# Patient Record
Sex: Female | Born: 1980 | Race: White | Hispanic: No | Marital: Single | Smoking: Former smoker
Health system: Southern US, Community
[De-identification: ages and names within clinical notes are randomized; demographics above are authoritative.]

## PROBLEM LIST (undated history)

## (undated) DIAGNOSIS — F329 Major depressive disorder, single episode, unspecified: Secondary | ICD-10-CM

## (undated) DIAGNOSIS — F4310 Post-traumatic stress disorder, unspecified: Secondary | ICD-10-CM

## (undated) DIAGNOSIS — O009 Unspecified ectopic pregnancy without intrauterine pregnancy: Secondary | ICD-10-CM

## (undated) DIAGNOSIS — F419 Anxiety disorder, unspecified: Secondary | ICD-10-CM

## (undated) DIAGNOSIS — J45909 Unspecified asthma, uncomplicated: Secondary | ICD-10-CM

## (undated) DIAGNOSIS — F32A Depression, unspecified: Secondary | ICD-10-CM

## (undated) DIAGNOSIS — K589 Irritable bowel syndrome without diarrhea: Secondary | ICD-10-CM

## (undated) HISTORY — PX: TONSILLECTOMY: SUR1361

## (undated) HISTORY — DX: Major depressive disorder, single episode, unspecified: F32.9

## (undated) HISTORY — PX: TUBAL LIGATION: SHX77

## (undated) HISTORY — DX: Depression, unspecified: F32.A

---

## 1998-07-20 ENCOUNTER — Other Ambulatory Visit: Admission: RE | Admit: 1998-07-20 | Discharge: 1998-07-20 | Payer: Self-pay | Admitting: Obstetrics

## 1998-10-09 ENCOUNTER — Encounter: Admission: RE | Admit: 1998-10-09 | Discharge: 1998-10-09 | Payer: Self-pay | Admitting: Obstetrics & Gynecology

## 1999-04-06 ENCOUNTER — Other Ambulatory Visit: Admission: RE | Admit: 1999-04-06 | Discharge: 1999-04-06 | Payer: Self-pay | Admitting: Obstetrics & Gynecology

## 1999-04-06 ENCOUNTER — Encounter: Admission: RE | Admit: 1999-04-06 | Discharge: 1999-04-06 | Payer: Self-pay | Admitting: Obstetrics & Gynecology

## 1999-08-03 ENCOUNTER — Encounter: Admission: RE | Admit: 1999-08-03 | Discharge: 1999-08-03 | Payer: Self-pay | Admitting: Obstetrics & Gynecology

## 2000-03-03 ENCOUNTER — Encounter: Admission: RE | Admit: 2000-03-03 | Discharge: 2000-03-03 | Payer: Self-pay | Admitting: Internal Medicine

## 2000-03-28 ENCOUNTER — Encounter: Admission: RE | Admit: 2000-03-28 | Discharge: 2000-03-28 | Payer: Self-pay | Admitting: Obstetrics & Gynecology

## 2002-05-30 ENCOUNTER — Ambulatory Visit (HOSPITAL_COMMUNITY): Admission: RE | Admit: 2002-05-30 | Discharge: 2002-05-30 | Payer: Self-pay | Admitting: Gastroenterology

## 2003-10-24 ENCOUNTER — Emergency Department (HOSPITAL_COMMUNITY): Admission: EM | Admit: 2003-10-24 | Discharge: 2003-10-24 | Payer: Self-pay | Admitting: Emergency Medicine

## 2004-06-25 ENCOUNTER — Emergency Department (HOSPITAL_COMMUNITY): Admission: EM | Admit: 2004-06-25 | Discharge: 2004-06-25 | Payer: Self-pay | Admitting: Family Medicine

## 2004-09-02 ENCOUNTER — Emergency Department (HOSPITAL_COMMUNITY): Admission: EM | Admit: 2004-09-02 | Discharge: 2004-09-02 | Payer: Self-pay | Admitting: Family Medicine

## 2004-09-17 ENCOUNTER — Emergency Department (HOSPITAL_COMMUNITY): Admission: EM | Admit: 2004-09-17 | Discharge: 2004-09-17 | Payer: Self-pay | Admitting: Family Medicine

## 2004-12-10 ENCOUNTER — Emergency Department (HOSPITAL_COMMUNITY): Admission: EM | Admit: 2004-12-10 | Discharge: 2004-12-10 | Payer: Self-pay | Admitting: Family Medicine

## 2005-01-17 ENCOUNTER — Emergency Department (HOSPITAL_COMMUNITY): Admission: EM | Admit: 2005-01-17 | Discharge: 2005-01-17 | Payer: Self-pay | Admitting: Family Medicine

## 2005-01-29 ENCOUNTER — Emergency Department (HOSPITAL_COMMUNITY): Admission: AD | Admit: 2005-01-29 | Discharge: 2005-01-29 | Payer: Self-pay | Admitting: Family Medicine

## 2005-02-13 ENCOUNTER — Emergency Department (HOSPITAL_COMMUNITY): Admission: EM | Admit: 2005-02-13 | Discharge: 2005-02-13 | Payer: Self-pay | Admitting: Family Medicine

## 2005-03-04 ENCOUNTER — Emergency Department (HOSPITAL_COMMUNITY): Admission: EM | Admit: 2005-03-04 | Discharge: 2005-03-04 | Payer: Self-pay | Admitting: Family Medicine

## 2005-04-09 ENCOUNTER — Emergency Department (HOSPITAL_COMMUNITY): Admission: EM | Admit: 2005-04-09 | Discharge: 2005-04-09 | Payer: Self-pay | Admitting: Emergency Medicine

## 2005-04-29 ENCOUNTER — Emergency Department (HOSPITAL_COMMUNITY): Admission: AD | Admit: 2005-04-29 | Discharge: 2005-04-29 | Payer: Self-pay | Admitting: Emergency Medicine

## 2005-05-02 ENCOUNTER — Emergency Department (HOSPITAL_COMMUNITY): Admission: EM | Admit: 2005-05-02 | Discharge: 2005-05-02 | Payer: Self-pay | Admitting: *Deleted

## 2005-06-13 ENCOUNTER — Other Ambulatory Visit: Admission: RE | Admit: 2005-06-13 | Discharge: 2005-06-13 | Payer: Self-pay | Admitting: Obstetrics and Gynecology

## 2005-06-19 ENCOUNTER — Inpatient Hospital Stay (HOSPITAL_COMMUNITY): Admission: AD | Admit: 2005-06-19 | Discharge: 2005-06-19 | Payer: Self-pay | Admitting: Obstetrics and Gynecology

## 2005-07-02 ENCOUNTER — Emergency Department (HOSPITAL_COMMUNITY): Admission: EM | Admit: 2005-07-02 | Discharge: 2005-07-02 | Payer: Self-pay | Admitting: Emergency Medicine

## 2005-08-18 ENCOUNTER — Inpatient Hospital Stay (HOSPITAL_COMMUNITY): Admission: AD | Admit: 2005-08-18 | Discharge: 2005-08-18 | Payer: Self-pay | Admitting: Obstetrics and Gynecology

## 2005-09-10 ENCOUNTER — Ambulatory Visit: Payer: Self-pay | Admitting: Neonatology

## 2005-09-10 ENCOUNTER — Inpatient Hospital Stay (HOSPITAL_COMMUNITY): Admission: AD | Admit: 2005-09-10 | Discharge: 2005-09-15 | Payer: Self-pay | Admitting: Obstetrics and Gynecology

## 2005-09-13 ENCOUNTER — Encounter (INDEPENDENT_AMBULATORY_CARE_PROVIDER_SITE_OTHER): Payer: Self-pay | Admitting: Specialist

## 2005-12-06 ENCOUNTER — Emergency Department (HOSPITAL_COMMUNITY): Admission: EM | Admit: 2005-12-06 | Discharge: 2005-12-06 | Payer: Self-pay | Admitting: Family Medicine

## 2005-12-20 ENCOUNTER — Observation Stay (HOSPITAL_COMMUNITY): Admission: AD | Admit: 2005-12-20 | Discharge: 2005-12-20 | Payer: Self-pay | Admitting: Obstetrics and Gynecology

## 2005-12-20 ENCOUNTER — Encounter (INDEPENDENT_AMBULATORY_CARE_PROVIDER_SITE_OTHER): Payer: Self-pay | Admitting: *Deleted

## 2006-02-23 ENCOUNTER — Emergency Department (HOSPITAL_COMMUNITY): Admission: EM | Admit: 2006-02-23 | Discharge: 2006-02-23 | Payer: Self-pay | Admitting: Family Medicine

## 2006-04-04 ENCOUNTER — Emergency Department (HOSPITAL_COMMUNITY): Admission: EM | Admit: 2006-04-04 | Discharge: 2006-04-04 | Payer: Self-pay | Admitting: Family Medicine

## 2006-05-06 ENCOUNTER — Inpatient Hospital Stay (HOSPITAL_COMMUNITY): Admission: AD | Admit: 2006-05-06 | Discharge: 2006-05-06 | Payer: Self-pay | Admitting: Obstetrics and Gynecology

## 2006-06-16 ENCOUNTER — Other Ambulatory Visit: Admission: RE | Admit: 2006-06-16 | Discharge: 2006-06-16 | Payer: Self-pay | Admitting: Obstetrics and Gynecology

## 2006-12-11 ENCOUNTER — Emergency Department (HOSPITAL_COMMUNITY): Admission: EM | Admit: 2006-12-11 | Discharge: 2006-12-11 | Payer: Self-pay | Admitting: Family Medicine

## 2006-12-16 ENCOUNTER — Emergency Department (HOSPITAL_COMMUNITY): Admission: EM | Admit: 2006-12-16 | Discharge: 2006-12-16 | Payer: Self-pay | Admitting: Emergency Medicine

## 2007-02-04 ENCOUNTER — Emergency Department (HOSPITAL_COMMUNITY): Admission: EM | Admit: 2007-02-04 | Discharge: 2007-02-04 | Payer: Self-pay | Admitting: Emergency Medicine

## 2007-03-01 ENCOUNTER — Emergency Department (HOSPITAL_COMMUNITY): Admission: EM | Admit: 2007-03-01 | Discharge: 2007-03-01 | Payer: Self-pay | Admitting: Emergency Medicine

## 2007-04-04 ENCOUNTER — Emergency Department (HOSPITAL_COMMUNITY): Admission: EM | Admit: 2007-04-04 | Discharge: 2007-04-04 | Payer: Self-pay | Admitting: Family Medicine

## 2007-04-25 ENCOUNTER — Inpatient Hospital Stay (HOSPITAL_COMMUNITY): Admission: AD | Admit: 2007-04-25 | Discharge: 2007-04-25 | Payer: Self-pay | Admitting: Obstetrics and Gynecology

## 2007-07-05 ENCOUNTER — Ambulatory Visit (HOSPITAL_COMMUNITY): Admission: RE | Admit: 2007-07-05 | Discharge: 2007-07-05 | Payer: Self-pay | Admitting: Obstetrics and Gynecology

## 2007-08-02 ENCOUNTER — Ambulatory Visit (HOSPITAL_COMMUNITY): Admission: RE | Admit: 2007-08-02 | Discharge: 2007-08-02 | Payer: Self-pay | Admitting: Obstetrics and Gynecology

## 2007-11-04 ENCOUNTER — Inpatient Hospital Stay (HOSPITAL_COMMUNITY): Admission: AD | Admit: 2007-11-04 | Discharge: 2007-11-04 | Payer: Self-pay | Admitting: Obstetrics and Gynecology

## 2007-11-30 ENCOUNTER — Inpatient Hospital Stay (HOSPITAL_COMMUNITY): Admission: AD | Admit: 2007-11-30 | Discharge: 2007-12-01 | Payer: Self-pay | Admitting: *Deleted

## 2008-01-04 ENCOUNTER — Inpatient Hospital Stay (HOSPITAL_COMMUNITY): Admission: AD | Admit: 2008-01-04 | Discharge: 2008-01-04 | Payer: Self-pay | Admitting: Obstetrics and Gynecology

## 2008-01-09 ENCOUNTER — Inpatient Hospital Stay (HOSPITAL_COMMUNITY): Admission: AD | Admit: 2008-01-09 | Discharge: 2008-01-09 | Payer: Self-pay | Admitting: Obstetrics and Gynecology

## 2008-01-14 ENCOUNTER — Inpatient Hospital Stay (HOSPITAL_COMMUNITY): Admission: RE | Admit: 2008-01-14 | Discharge: 2008-01-16 | Payer: Self-pay | Admitting: Obstetrics and Gynecology

## 2008-01-15 ENCOUNTER — Encounter (INDEPENDENT_AMBULATORY_CARE_PROVIDER_SITE_OTHER): Payer: Self-pay | Admitting: Obstetrics and Gynecology

## 2008-02-23 ENCOUNTER — Inpatient Hospital Stay (HOSPITAL_COMMUNITY): Admission: AD | Admit: 2008-02-23 | Discharge: 2008-02-24 | Payer: Self-pay | Admitting: Obstetrics and Gynecology

## 2009-01-17 ENCOUNTER — Emergency Department (HOSPITAL_COMMUNITY): Admission: EM | Admit: 2009-01-17 | Discharge: 2009-01-17 | Payer: Self-pay | Admitting: Emergency Medicine

## 2009-07-11 ENCOUNTER — Inpatient Hospital Stay (HOSPITAL_COMMUNITY): Admission: AD | Admit: 2009-07-11 | Discharge: 2009-07-12 | Payer: Self-pay | Admitting: Obstetrics and Gynecology

## 2011-03-05 LAB — URINALYSIS, ROUTINE W REFLEX MICROSCOPIC
Glucose, UA: NEGATIVE mg/dL
Hgb urine dipstick: NEGATIVE
Ketones, ur: 15 mg/dL — AB
Leukocytes, UA: NEGATIVE
Nitrite: POSITIVE — AB
Protein, ur: 100 mg/dL — AB
Specific Gravity, Urine: >1.03 — ABNORMAL HIGH (ref 1.005–1.030)
Urobilinogen, UA: 1 mg/dL (ref 0.0–1.0)
pH: 5.5 (ref 5.0–8.0)

## 2011-03-05 LAB — URINE MICROSCOPIC-ADD ON
RBC / HPF: NONE SEEN RBC/hpf (ref <3)
WBC, UA: NONE SEEN WBC/hpf (ref <3)

## 2011-03-05 LAB — CBC
HCT: 37.9 % (ref 36.0–46.0)
Hemoglobin: 12.8 g/dL (ref 12.0–15.0)
MCHC: 33.9 g/dL (ref 30.0–36.0)
MCV: 82.4 fL (ref 78.0–100.0)
Platelets: 162 10*3/uL (ref 150–400)
RBC: 4.59 MIL/uL (ref 3.87–5.11)
RDW: 13.1 % (ref 11.5–15.5)
WBC: 10.7 10*3/uL — ABNORMAL HIGH (ref 4.0–10.5)

## 2011-03-05 LAB — WET PREP, GENITAL
Clue Cells Wet Prep HPF POC: NONE SEEN
Trich, Wet Prep: NONE SEEN
Yeast Wet Prep HPF POC: NONE SEEN

## 2011-03-05 LAB — POCT PREGNANCY, URINE: Preg Test, Ur: NEGATIVE

## 2011-03-05 LAB — GC/CHLAMYDIA PROBE AMP, GENITAL
Chlamydia, DNA Probe: NEGATIVE
GC Probe Amp, Genital: NEGATIVE

## 2011-03-15 LAB — POCT RAPID STREP A (OFFICE): Streptococcus, Group A Screen (Direct): NEGATIVE

## 2011-04-12 NOTE — Op Note (Signed)
[name], [name]                    ACCOUNT NO.:  [id number]   MEDICAL RECORD NO.:  [id number]          PATIENT TYPE:  INP   LOCATION:  9106                          FACILITY:  [facility]   PHYSICIAN:  Zenaida Niece, M.D.DATE OF BIRTH:  June 27, 1981   DATE OF PROCEDURE:  01/15/2008  DATE OF DISCHARGE:                               OPERATIVE REPORT   PREOPERATIVE DIAGNOSES:  Desires surgical sterility.   POSTOPERATIVE DIAGNOSES:  Desires surgical sterility.   OPERATION/PROCEDURE:  Right partial salpingectomy.   SURGEON:  Zenaida Niece, M.D.   ANESTHESIA:  General endotracheal tube.   ESTIMATED BLOOD LOSS:  Minimal.   FINDINGS:  The patient had normal anatomy except for a surgically absent  left fallopian tube.   SPECIMENS:  Portion of right fallopian tube sent to pathology.   COMPLICATIONS:  None.   PROCEDURE IN DETAIL:  The patient was taken to the operating room and  placed in the dorsal supine position.  General anesthesia was induced.  Abdomen was prepped and draped in the usual sterile fashion.  A 3 cm  infraumbilical incision was made and carried down to the fascia.  The  fascia was elevated and entered sharply with scissors.  Peritoneal  cavity was then entered bluntly.  The right angle retractors were used  to expose the intra-abdominal cavity and the incision was extended  bilaterally sharply.  A moist lap pad was used to pack the bowels off of  the uterus.  The right fallopian tube was identified and traced to its  fimbriated end.  A knuckle of tube was elevated with Babcock clamp.  This knuckle of tube was then ligated with 0 plain gut suture.  The  knuckle of tube was then removed sharply.  Both tubal ostia were  identified and the stumps were hemostatic.  Inspection on the left side  revealed a surgically absent left fallopian tube.  The sponge and  retractors were removed.  Fascia was closed with running 0 Vicryl.  Skin  was closed with a running subcuticular   suture of 4-0 Vicryl followed by  a bandage.  The patient tolerated the procedure well and was taken to  the recovery room in stable condition.  Counts were correct.      Zenaida Niece, M.D.  Electronically Signed     TDM/MEDQ  D:  01/15/2008  T:  01/16/2008  Job:  [id number]

## 2011-04-12 NOTE — Discharge Summary (Signed)
NAMEJANIA, [name]                    ACCOUNT NO.:  [id number]   MEDICAL RECORD NO.:  [id number]          PATIENT TYPE:  INP   LOCATION:  9106                          [facility]   PHYSICIAN:  Zenaida Niece, M.D.DATE OF BIRTH:  [birth year]   DATE OF ADMISSION:  01/14/2008  DATE OF DISCHARGE:  01/16/2008                               DISCHARGE SUMMARY   ADMISSION DIAGNOSES:  1. Intrauterine pregnancy at 39 weeks.  2. Group B Streptococcus carrier.  3. Positive anti-E antibody.  4. Desires surgical sterility.   DISCHARGE DIAGNOSES:  1. Intrauterine pregnancy at 39 weeks.  2. Group B Streptococcus carrier.  3. Positive anti-E antibody.  4. Desires surgical sterility.   PROCEDURES:  1. On February 16, she had a spontaneous vaginal delivery.  2. On February 17, she had a right partial salpingectomy.   HISTORY AND PHYSICAL:  [name] gravida 4,  para 1-1-1-1 with an EGA of 39+ weeks who is admitted for elective  induction.  Prenatal care is complicated by prior preterm delivery for  which she received 17-progesterone during her pregnancy, anxiety treated  with Zoloft which she eventually stopped.  She had an amniocentesis done  for elevated risk of trisomy 33 which revealed a normal 40 XY karyotype  and she has had a positive anti-E antibody with a very low titer.   PRENATAL LABORATORIES:  Blood type is B+ with a positive antibody screen  for anti-E.  RPR nonreactive, rubella immune, hepatitis B surface  antigen negative, HIV negative, gonorrhea and chlamydia negative, 1-hour  Glucola 76, group B strep is positive.   PAST OBSTETRIC HISTORY:  In 2002, vaginal delivery at 39 weeks, 6 pounds  10 ounces.  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 In 2006, vaginal  delivery 25 weeks, 1 pound 4 ounces PPROM and a precipitous delivery.  In 2007, she had an ectopic pregnancy.   GYNECOLOGY HISTORY:  History of cryotherapy, gonorrhea, and chlamydia.   PAST  MEDICAL HISTORY:  1. Irritable bowel syndrome.  2. anxiety.   PAST SURGICAL HISTORY:  1. Left salpingectomy for an ectopic.  2. Surgery for a nose fracture.   ALLERGIES:  POSSIBLY TO LATEX AND FLAGYL.   PHYSICAL EXAM:  VITAL SIGNS:  She is afebrile with stable vital signs.  Fetal heart tracing reactive.  ABDOMEN:  Gravid, nontender with an estimated fetal weight of 8 pounds.  GENITOURINARY:  Cervix is 3, 50, -2, vertex presentation, adequate  pelvis, amniotomy reveals clear fluid.   HOSPITAL COURSE:  The patient was admitted and had amniotomy performed  for induction and was also started on Pitocin.  She was also put on  penicillin for group B strep prophylaxis.  She eventually progressed  into labor, progressed to complete, pushed well and on the evening of  February 16th had a vaginal delivery of a viable female infant with Apgars  of 8 and 9, weight 7 pounds 14 ounces.  Placenta delivered spontaneously  and was sent for cord blood collection.  She had a small second-degree  laceration repaired with  3-0 Vicryl with local block.  She did have some  continued postpartum bleeding that responded to massage, Pitocin, and IM  Methergine with an estimated blood loss of 800 mL.  Postpartum she had  no significant complications.  Pre-delivery hemoglobin 11.5,  postdelivery 9.5.  She did wish to undergo tubal ligation.  On the  afternoon of postpartum #1, she had a right partial salpingectomy done  with evidence of prior left salpingectomy.  This was done under general  anesthesia without complications.  On postpartum day #2 her incision was  healing well and she was felt to be stable enough for discharge home.   DISCHARGE INSTRUCTIONS:  Regular diet, pelvic rest, no strenuous  activity.  Followup is in 6 weeks.   MEDICATIONS:  1. Percocet #30, 1-to-2 p.o. every 4-to-6 hours p.r.n. pain.  2. Over-the-counter ibuprofen as needed.    She is given our discharge pamphlet.      Zenaida Niece, M.D.  Electronically Signed     TDM/MEDQ  D:  01/16/2008  T:  01/16/2008  Job:  [id number]

## 2011-04-15 NOTE — Op Note (Signed)
[name], [name]                    ACCOUNT NO.:  [id number]   MEDICAL RECORD NO.:  [id number]          PATIENT TYPE:  INP   LOCATION:  9319                          [facility]:  [facility]   PHYSICIAN:  Zenaida Niece, M.D.DATE OF BIRTH:  [birth year]   DATE OF PROCEDURE:  12/20/2005  DATE OF DISCHARGE:                                 OPERATIVE REPORT   PREOPERATIVE DIAGNOSIS:  Ectopic pregnancy.   POSTOPERATIVE DIAGNOSIS:  Ruptured left ectopic pregnancy.   PROCEDURES:  Laparoscopy with left salpingectomy.   SURGEON:  Zenaida Niece, M.D.   ANESTHESIA:  General endotracheal tube.   SPECIMENS:  Left fallopian tube.   ESTIMATED BLOOD LOSS:  2000 mL of hemoperitoneum.   COMPLICATIONS:  None.   FINDINGS:  She had a 2000 mL hemoperitoneum and a ruptured fairly proximal  left ectopic pregnancy. The remainder of her pelvic anatomy appeared normal.   PROCEDURE IN DETAIL:  The patient was taken to the operating room and placed  in the dorsosupine position. General anesthesia was induced and she was  placed in mobile stirrups. Abdomen was then prepped and draped in the usual  sterile fashion, a Foley catheter was inserted and a Hulka tenaculum applied  to the cervix for uterine manipulation. Infraumbilical skin was then  infiltrated with quarter percent Marcaine and a 1.5 cm horizontal incision  was made. The Veress needle was inserted in the peritoneal cavity and  placement confirmed by the water drop test at opening pressure of 6 mmHg.  CO2 gas was insufflated to a pressure of 14 mmHg and the Veress needle was  removed. A size 11 disposable trocar was then introduced and placement  confirmed by laparoscope. She was noted to have a large amount of  hemoperitoneum. A 10 mm port was then placed on the left side under direct  visualization. A large bore suction irrigator was used to remove as much  clot and blood as possible. I was able to eventually identify the pelvic  organs. Uterus  appeared normal. Right tube and ovary appeared normal  although the tube was slightly anterior in the pelvis against the pelvic  sidewall but there did not appear to be significant adhesions. Left ovary  was normal. Left tube appeared normal except for approximately 2 cm from the  uterine cornu there was a site of rupture that was actively bleeding. Using  the tripolar device the left tube was freed from the mesosalpinx and then  freed proximally from the remainder of the tube. This removed the portion of  the tube that was bleeding with a suspected ruptured ectopic pregnancy. The  tube was removed further through the port intact. The site was inspected and  found to be hemostatic. The remainder of the clot was removed as best  possible through the suction irrigator. There was approximately 2000 mL of  blood in the pelvis. Again all the remaining anatomy appeared normal. Once  as much clot and blood was removed as possible the trocar on the left was  removed under direct visualization. All gas was allowed to deflate  from the  abdomen and the laparoscope was removed. The umbilical port was then also  removed. Skin incisions were closed with interrupted subcuticular sutures of  3-0 Vicryl  followed by Dermabond. The Hulka tenaculum and Foley catheter were removed  at the end of the case. The patient tolerated the procedure well. She  received approximately 3800 mL of crystalloid and remained hemodynamically  stable. I am going to check a hemoglobin in the recovery room to decide if  she needs a transfusion.      Zenaida Niece, M.D.  Electronically Signed     TDM/MEDQ  D:  12/20/2005  T:  12/20/2005  Job:  [id number]
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
## 2011-08-18 LAB — URINALYSIS, ROUTINE W REFLEX MICROSCOPIC
Bilirubin Urine: NEGATIVE
Glucose, UA: NEGATIVE
Hgb urine dipstick: NEGATIVE
Ketones, ur: NEGATIVE
Nitrite: NEGATIVE
Protein, ur: NEGATIVE
Specific Gravity, Urine: 1.025
Urobilinogen, UA: 0.2
pH: 5.5

## 2011-08-19 LAB — WET PREP, GENITAL
Clue Cells Wet Prep HPF POC: NONE SEEN
Trich, Wet Prep: NONE SEEN
Yeast Wet Prep HPF POC: NONE SEEN

## 2011-08-19 LAB — CBC
HCT: 27.3 — ABNORMAL LOW
HCT: 33.3 — ABNORMAL LOW
Hemoglobin: 11.5 — ABNORMAL LOW
Hemoglobin: 9.5 — ABNORMAL LOW
MCHC: 34.5
MCHC: 35
MCV: 78.9
MCV: 79.7
Platelets: 118 — ABNORMAL LOW
Platelets: 154
RBC: 3.43 — ABNORMAL LOW
RBC: 4.21
RDW: 14.4
RDW: 14.4
WBC: 10.4
WBC: 12.1 — ABNORMAL HIGH

## 2011-08-19 LAB — TYPE AND SCREEN
ABO/RH(D): B POS
Antibody Screen: POSITIVE
DAT, IgG: NEGATIVE
Donor AG Type: NEGATIVE
Donor AG Type: NEGATIVE
PT AG Type: NEGATIVE

## 2011-08-19 LAB — RPR: RPR Ser Ql: NONREACTIVE

## 2011-08-22 LAB — CBC
HCT: 29.6 — ABNORMAL LOW
Hemoglobin: 10 — ABNORMAL LOW
MCHC: 33.6
MCV: 77.2 — ABNORMAL LOW
Platelets: 156
RBC: 3.84 — ABNORMAL LOW
RDW: 14.6
WBC: 9.4

## 2011-08-22 LAB — WET PREP, GENITAL: Clue Cells Wet Prep HPF POC: NONE SEEN

## 2011-08-22 LAB — GC/CHLAMYDIA PROBE AMP, GENITAL: GC Probe Amp, Genital: NEGATIVE

## 2011-09-05 LAB — URINE MICROSCOPIC-ADD ON

## 2011-09-05 LAB — URINALYSIS, ROUTINE W REFLEX MICROSCOPIC
Leukocytes, UA: NEGATIVE
Nitrite: NEGATIVE
Protein, ur: NEGATIVE
Specific Gravity, Urine: >1.03 — ABNORMAL HIGH
Urobilinogen, UA: 0.2

## 2012-09-26 LAB — HM PAP SMEAR: HM PAP: NORMAL

## 2013-01-05 ENCOUNTER — Encounter (HOSPITAL_COMMUNITY): Payer: Self-pay

## 2013-01-05 DIAGNOSIS — R109 Unspecified abdominal pain: Secondary | ICD-10-CM | POA: Insufficient documentation

## 2013-01-05 LAB — COMPREHENSIVE METABOLIC PANEL
ALT: 46 U/L — ABNORMAL HIGH (ref 0–35)
Albumin: 3.7 g/dL (ref 3.5–5.2)
Alkaline Phosphatase: 93 U/L (ref 39–117)
BUN: 9 mg/dL (ref 6–23)
Chloride: 100 mEq/L (ref 96–112)
GFR calc Af Amer: >90 mL/min (ref >90)
Glucose, Bld: 110 mg/dL — ABNORMAL HIGH (ref 70–99)
Potassium: 3.5 mEq/L (ref 3.5–5.1)
Sodium: 138 mEq/L (ref 135–145)
Total Bilirubin: 0.4 mg/dL (ref 0.3–1.2)

## 2013-01-05 LAB — CBC WITH DIFFERENTIAL/PLATELET
Basophils Absolute: 0 10*3/uL (ref 0.0–0.1)
Basophils Relative: 0 % (ref 0–1)
Eosinophils Relative: 1 % (ref 0–5)
HCT: 40.1 % (ref 36.0–46.0)
Lymphocytes Relative: 22 % (ref 12–46)
MCHC: 33.7 g/dL (ref 30.0–36.0)
MCV: 83.5 fL (ref 78.0–100.0)
Monocytes Absolute: 0.5 10*3/uL (ref 0.1–1.0)
Platelets: 178 10*3/uL (ref 150–400)
RDW: 12.9 % (ref 11.5–15.5)
WBC: 11.5 10*3/uL — ABNORMAL HIGH (ref 4.0–10.5)

## 2013-01-05 NOTE — ED Notes (Signed)
Patient presents with c/o lower abdominal pain and bloating x 1 hour. Denies N/V/diarrhea or constipation. Admits to "dry heaving." sts that she had a "panic attack" prior to the onset of her abdominal pain. Currently pt is calm and does not appear to be anxious.

## 2013-01-06 ENCOUNTER — Emergency Department (HOSPITAL_COMMUNITY)
Admission: EM | Admit: 2013-01-06 | Discharge: 2013-01-06 | Discharge status: Against Medical Advice | Payer: Medicaid Other | Attending: Emergency Medicine | Admitting: Emergency Medicine

## 2013-01-06 HISTORY — DX: Unspecified ectopic pregnancy without intrauterine pregnancy: O00.90

## 2013-01-06 HISTORY — DX: Irritable bowel syndrome, unspecified: K58.9

## 2013-01-06 HISTORY — DX: Anxiety disorder, unspecified: F41.9

## 2013-01-06 NOTE — ED Notes (Signed)
Called for pt. To come be seen by EDP. Pt. Did not respond. We looked in lobby and outside.

## 2013-03-04 ENCOUNTER — Ambulatory Visit (INDEPENDENT_AMBULATORY_CARE_PROVIDER_SITE_OTHER): Payer: Medicaid Other | Admitting: Physician Assistant

## 2013-03-04 ENCOUNTER — Encounter: Payer: Self-pay | Admitting: Physician Assistant

## 2013-03-04 ENCOUNTER — Other Ambulatory Visit: Payer: Self-pay | Admitting: Physician Assistant

## 2013-03-04 VITALS — BP 118/68 | HR 92 | Temp 98.7°F | Resp 18 | Ht 66.5 in | Wt 286.0 lb

## 2013-03-04 DIAGNOSIS — K219 Gastro-esophageal reflux disease without esophagitis: Secondary | ICD-10-CM

## 2013-03-04 DIAGNOSIS — A499 Bacterial infection, unspecified: Secondary | ICD-10-CM

## 2013-03-04 DIAGNOSIS — B9689 Other specified bacterial agents as the cause of diseases classified elsewhere: Secondary | ICD-10-CM

## 2013-03-04 DIAGNOSIS — N760 Acute vaginitis: Secondary | ICD-10-CM

## 2013-03-04 LAB — WET PREP FOR TRICH, YEAST, CLUE: Yeast Wet Prep HPF POC: NONE SEEN

## 2013-03-04 MED ORDER — OMEPRAZOLE 20 MG PO CPDR: 20.0000 mg | DELAYED_RELEASE_CAPSULE | Freq: Every day | ORAL | Status: DC

## 2013-03-04 MED ORDER — METRONIDAZOLE 500 MG PO TABS: 500.0000 mg | ORAL_TABLET | Freq: Two times a day (BID) | ORAL | Status: DC

## 2013-03-04 NOTE — Progress Notes (Signed)
:   Patient ID: [name] MRN: [id number], DOB: 06/10/1981, 32 y.o. Date of Encounter: @DATE @  Chief Complaint:  Chief Complaint  Patient presents with  . vaginal discomfort  feels raw    HPI: 32 y.o. year old female  presents with :  1-Vaginal itching and burning. Small amount of discharge. No pelvic pain. No abnormal uterine bleeding. 2-GERD: No matter waht she eats, even if very bland food, feels pain in chest and heart burn. Has used otc antacids, tums, etc with no relief. No other abdominal pain or epigastric pain.    Past Medical History  Diagnosis Date  . Anxiety   . Irritable bowel syndrome (IBS) .  . Ectopic pregnancy      Home Meds: No current outpatient prescriptions on file prior to visit.   No current facility-administered medications on file prior to visit.    Allergies:  Allergies  Allergen Reactions  . Latex Rash    History   Social History  . Marital Status: Single    Spouse Name: N/A    Number of Children: N/A  . Years of Education: N/A   Occupational History  . Not on file.   Social History Main Topics  . Smoking status: Current Every Day Smoker -- 0.50 packs/day    Types: Cigarettes  . Smokeless tobacco: Never Used  . Alcohol Use: Yes     Comment: social  . Drug Use: Yes    Special: Marijuana  . Sexually Active: Yes    Birth Control/ Protection: None   Other Topics Concern  . Not on file   Social History Narrative  . No narrative on file    No family history on file.   Review of Systems: Constitutional: negative for chills, fever, night sweats, weight changes, or fatigue  HEENT: negative for vision changes, hearing loss, congestion, rhinorrhea, ST, epistaxis, or sinus pressure Cardiovascular: negative for chest pain or palpitations. No new/increased shortness of breath or dyspnea on exertion Respiratory: negative for hemoptysis, wheezing, shortness of breath, or cough Abdominal: negative for abdominal pain, nausea, vomiting,  diarrhea, or constipation Dermatological: negative for rash or concerning skin lesions Neurologic: negative for headache, dizziness, or syncope All other systems reviewed and are otherwise negative with the exception to those above and in the HPI.   Physical Exam: Blood pressure 118/68, pulse 92, temperature 98.7 F (37.1 C), temperature source Oral, resp. rate 18, height 5' 6.5" (1.689 m), weight 286 lb (129.729 kg), last menstrual period 02/10/2013., Body mass index is 45.48 kg/(m^2). General: Obese WF. in no acute distress. Neck: Supple. No thyromegaly. Full ROM. No lymphadenopathy. Lungs: Clear bilaterally to auscultation without wheezes, rales, or rhonchi. Breathing is unlabored. Heart: RRR with S1 S2. No murmurs, rubs, or gallops. Abdomen: Soft, non-tender, non-distended with normoactive bowel sounds. No hepatomegaly. No rebound/guarding. No obvious abdominal masses. Pelvic: External Genitalia: Normal. Medial surface of vulva has diffuse erythema. No lesions. Cervix normal. The only discharge present is liquidy clear. Bimanual exam is nml with no cervical motion tenderness. No masses.  Musculoskeletal:  Strength and tone normal for age. Extremities/Skin: Warm and dry. No clubbing or cyanosis. No edema. No rashes or suspicious lesions. Neuro: Alert and oriented X 3. Moves all extremities spontaneously. Gait is normal. CNII-XII grossly in tact. Psych:  Responds to questions appropriately with a normal affect.    ASSESSMENT AND PLAN:  32 y.o. year old female with  1. Vaginitis and vulvovaginitis - WET PREP FOR TRICH, YEAST, CLUE - GC/chlamydia probe  amp, genital   2. GERD (gastroesophageal reflux disease) - omeprazole (PRILOSEC) 20 MG capsule; Take 1 capsule (20 mg total) by mouth daily.  Dispense: 30 capsule; Refill: 3   Signed, [address] [place], [place], [facility] 03/04/2013 4:11 PM  .1. Vaginitis and vulvovaginitis  - WET PREP FOR TRICH, YEAST, CLUE - GC/chlamydia probe amp,  genital  2. GERD (gastroesophageal reflux disease) Avoid spicy foods. Avoid eating prior to lying down at night. - omeprazole (PRILOSEC) 20 MG capsule; Take 1 capsule (20 mg total) by mouth daily.  Dispense: 30 capsule; Refill: 3  3. Bacterial vaginitis  - metroNIDAZOLE (FLAGYL) 500 MG tablet; Take 1 tablet (500 mg total) by mouth 2 (two) times daily with a meal.  Dispense: 14 tablet; Refill: 0

## 2013-06-14 ENCOUNTER — Ambulatory Visit: Payer: Medicaid Other | Admitting: Family Medicine

## 2013-08-14 ENCOUNTER — Emergency Department (HOSPITAL_COMMUNITY): Payer: Medicaid Other

## 2013-08-14 ENCOUNTER — Emergency Department (HOSPITAL_COMMUNITY)
Admission: EM | Admit: 2013-08-14 | Discharge: 2013-08-14 | Disposition: A | Discharge status: Home / Self Care | Payer: Medicaid Other | Attending: Emergency Medicine | Admitting: Emergency Medicine

## 2013-08-14 ENCOUNTER — Encounter (HOSPITAL_COMMUNITY): Payer: Self-pay

## 2013-08-14 DIAGNOSIS — Z8659 Personal history of other mental and behavioral disorders: Secondary | ICD-10-CM | POA: Insufficient documentation

## 2013-08-14 DIAGNOSIS — F17200 Nicotine dependence, unspecified, uncomplicated: Secondary | ICD-10-CM | POA: Insufficient documentation

## 2013-08-14 DIAGNOSIS — J45909 Unspecified asthma, uncomplicated: Secondary | ICD-10-CM | POA: Insufficient documentation

## 2013-08-14 DIAGNOSIS — R071 Chest pain on breathing: Secondary | ICD-10-CM | POA: Insufficient documentation

## 2013-08-14 DIAGNOSIS — Z8719 Personal history of other diseases of the digestive system: Secondary | ICD-10-CM | POA: Insufficient documentation

## 2013-08-14 DIAGNOSIS — Z79899 Other long term (current) drug therapy: Secondary | ICD-10-CM | POA: Insufficient documentation

## 2013-08-14 DIAGNOSIS — R0789 Other chest pain: Secondary | ICD-10-CM

## 2013-08-14 DIAGNOSIS — Z91040 Latex allergy status: Secondary | ICD-10-CM | POA: Insufficient documentation

## 2013-08-14 HISTORY — DX: Unspecified asthma, uncomplicated: J45.909

## 2013-08-14 MED ORDER — NAPROXEN 500 MG PO TABS: 500.0000 mg | ORAL_TABLET | Freq: Two times a day (BID) | ORAL | Status: DC

## 2013-08-14 MED ORDER — NAPROXEN 250 MG PO TABS
500.0000 mg | ORAL_TABLET | Freq: Once | ORAL | Status: AC
  Administered 2013-08-14: 500 mg via ORAL
  Filled 2013-08-14: qty 2

## 2013-08-14 NOTE — ED Notes (Signed)
Pt. C/o central chest pain radiating to back starting last night. Reproducible with palpation. Hurts worse with lifting and movement of arms. Denies SOB, N/V, dizziness. Denies lifting anything heavy.

## 2013-08-14 NOTE — ED Provider Notes (Signed)
CSN: [id number]     Arrival date & time 08/14/13  0615 History   First MD Initiated Contact with Patient 08/14/13 [phone]     Chief Complaint  Patient presents with  . Chest Pain   (Consider location/radiation/quality/duration/timing/severity/associated sxs/prior Treatment) HPI  The patient is a 32 year old female with history of anxiety but no history of hypertension diabetes or hypercholesterolemia. She presents with a complaint of chest pain. She states this started last night after she had laid on the couch all day on her right arm. This pain is located in the middle of her chest, worse with moving her right arm, worse with sitting up and moving from side to side. She has never had any exertional symptoms, she has no swelling in her legs or risk factors for pulmonary embolism. This pain is mild at rest, worse with moving the right arm. She has not tried any medications prior to arrival  Past Medical History  Diagnosis Date  . Anxiety   . Irritable bowel syndrome (IBS) .  . Ectopic pregnancy   . Asthma    Past Surgical History  Procedure Laterality Date  . Tubal ligation     No family history on file. History  Substance Use Topics  . Smoking status: Current Every Day Smoker -- 0.50 packs/day    Types: Cigarettes  . Smokeless tobacco: Never Used  . Alcohol Use: Yes     Comment: social   OB History   Grav Para Term Preterm Abortions TAB SAB Ect Mult Living                 Review of Systems  All other systems reviewed and are negative.    Allergies  Latex  Home Medications   Current Outpatient Rx  Name  Route  Sig  Dispense  Refill  . omeprazole (PRILOSEC) 20 MG capsule   Oral   Take 1 capsule (20 mg total) by mouth daily.   30 capsule   3   . naproxen (NAPROSYN) 500 MG tablet   Oral   Take 1 tablet (500 mg total) by mouth 2 (two) times daily with a meal.   30 tablet   0    BP 104/64  Pulse 64  Temp(Src) 98 F (36.7 C) (Oral)  Resp 18  Ht 5\' 8"  (1.727  m)  Wt 286 lb (129.729 kg)  BMI 43.5 kg/m2  SpO2 98%  LMP 08/12/2013 Physical Exam  Nursing note and vitals reviewed. Constitutional: She appears well-developed and well-nourished. No distress.  HENT:  Head: Normocephalic and atraumatic.  Mouth/Throat: Oropharynx is clear and moist. No oropharyngeal exudate.  Eyes: Conjunctivae and EOM are normal. Pupils are equal, round, and reactive to light. Right eye exhibits no discharge. Left eye exhibits no discharge. No scleral icterus.  Neck: Normal range of motion. Neck supple. No JVD present. No thyromegaly present.  Cardiovascular: Normal rate, regular rhythm, normal heart sounds and intact distal pulses.  Exam reveals no gallop and no friction rub.   No murmur heard. Pulmonary/Chest: Effort normal and breath sounds normal. No respiratory distress. She has no wheezes. She has no rales. She exhibits tenderness ( Reproducible tenderness to the chest with palpation over the sternum as well as with movement of the right arm against resistance).  Abdominal: Soft. Bowel sounds are normal. She exhibits no distension and no mass. There is no tenderness.  Musculoskeletal: Normal range of motion. She exhibits no edema and no tenderness.  Lymphadenopathy:    She has no  cervical adenopathy.  Neurological: She is alert. Coordination normal.  Skin: Skin is warm and dry. No rash noted. No erythema.  Psychiatric: She has a normal mood and affect. Her behavior is normal.    ED Course  Procedures (including critical care time) Labs Review Labs Reviewed - No data to display Imaging Review Dg Chest 2 View  08/14/2013   CLINICAL DATA:  Chest pressure  EXAM: CHEST  2 VIEW  COMPARISON:  None.  FINDINGS: The heart size and mediastinal contours are within normal limits. Both lungs are clear. Mild dextroscoliosis of the thoracic spine.  IMPRESSION: No active cardiopulmonary disease.   Electronically Signed   By: Tiburcio Pea   On: 08/14/2013 07:18    MDM    1. Chest wall pain    Overall the patient is well-appearing, she has normal vital signs, I personally reviewed and interpreted her chest x-ray which is a 2 view PA and lateral, there is no signs of infiltrate pneumothorax or other bony abnormalities.  The patient's EKG is totally normal, there is no old EKG with which to compare. I will treat the patient with NSAIDs, I suspect that her pain is muscular related in relation to yesterday's events, there is no signs of cardiac disease and she appears stable for discharge on NSAIDs therapy.  ED ECG REPORT  I personally interpreted this EKG   Date: 08/14/2013   Rate: 69  Rhythm: normal sinus rhythm  QRS Axis: normal  Intervals: normal  ST/T Wave abnormalities: normal  Conduction Disutrbances:none  Narrative Interpretation:   Old EKG Reviewed: none available  Meds given in ED:  Medications  naproxen (NAPROSYN) tablet 500 mg (not administered)    New Prescriptions   NAPROXEN (NAPROSYN) 500 MG TABLET    Take 1 tablet (500 mg total) by mouth 2 (two) times daily with a meal.      Vida Roller, MD 08/14/13 [phone]

## 2013-08-14 NOTE — Progress Notes (Signed)
Received call from patient regarding discharge medications. States, "My instructions say I am supposed to take naprosyn but I don't have a prescription. I called my pharmacy to see if it was called in and it wasn't." [facility] spoke with Dr.Miller who will print out another copy. EDCM told patient that the prescription will be left at the front registration/nurse first desk in the ED with her name on it. No further needs identified.

## 2013-09-10 ENCOUNTER — Telehealth: Payer: Self-pay | Admitting: Family Medicine

## 2013-09-10 NOTE — Telephone Encounter (Signed)
C/O skin irritation or rash in outer peri area.  appt given for in AM

## 2013-09-11 ENCOUNTER — Encounter: Payer: Self-pay | Admitting: Physician Assistant

## 2013-09-11 ENCOUNTER — Ambulatory Visit (INDEPENDENT_AMBULATORY_CARE_PROVIDER_SITE_OTHER): Payer: Medicaid Other | Admitting: Physician Assistant

## 2013-09-11 VITALS — BP 110/64 | HR 80 | Temp 97.3°F | Resp 18 | Ht 68.75 in | Wt 293.0 lb

## 2013-09-11 DIAGNOSIS — B372 Candidiasis of skin and nail: Secondary | ICD-10-CM

## 2013-09-11 MED ORDER — NYSTATIN PO POWD: ORAL | Status: DC

## 2013-09-11 NOTE — Progress Notes (Signed)
   Patient ID: [name] MRN: [id number], DOB: [birth year], 32 y.o. Date of Encounter: 09/11/2013, 9:10 AM    Chief Complaint:  Chief Complaint  Patient presents with  . skin irritation in peri area and groin area     HPI: 32 y.o. year old obese white female just recently noticed it the last couple of days that the skin in her groin crease has  diffuse erythema.     Home Meds: See attached medication section for any medications that were entered at today's visit. The computer does not put those onto this list.The following list is a list of meds entered prior to today's visit.  None   Allergies:  Allergies  Allergen Reactions  . Latex Rash      Review of Systems: See HPI for pertinent ROS. All other ROS negative.    Physical Exam: Blood pressure 110/64, pulse 80, temperature 97.3 F (36.3 C), temperature source Oral, resp. rate 18, height 5' 8.75" (1.746 m), weight 293 lb (132.904 kg), last menstrual period 08/12/2013., Body mass index is 43.6 kg/(m^2). General: Obese white female. Appears in no acute distress. Lungs: Clear bilaterally to auscultation without wheezes, rales, or rhonchi. Breathing is unlabored. Heart: Regular rhythm. No murmurs, rubs, or gallops. Msk:  Strength and tone normal for age. Skin: Right groin crease: Approximate 6 inch area of diffuse erythema mild. No skin lesions. Neuro: Alert and oriented X 3. Moves all extremities spontaneously. Gait is normal. CNII-XII grossly in tact. Psych:  Responds to questions appropriately with a normal affect.     ASSESSMENT AND PLAN:  32 y.o. year old female with  1. Cutaneous candidiasis Discussed cause of this. After showers toweled dry completely. She may even want to use her hairdryer to further dry the skin there. If she has any sweating then she needs to also dry her skin and put on new underwear and pants. - Nystatin POWD; Apply to affected are 3 times a day  Dispense: 1 Bottle; Refill: 0   Signed, [id number] [place], [place], [facility] 09/11/2013 9:10 AM

## 2013-10-10 ENCOUNTER — Telehealth: Payer: Self-pay | Admitting: *Deleted

## 2013-10-10 MED ORDER — NYSTATIN 100000 UNIT/GM EX CREA: 1.0000 "application " | TOPICAL_CREAM | Freq: Two times a day (BID) | CUTANEOUS | Status: DC

## 2013-10-10 NOTE — Telephone Encounter (Signed)
At the visit I prescribed nystatin powder. But tried nystatin cream and see if this absorbs battering treat this better. Sen prescription for nystatin cream to apply to the area twice a day dispense # 45 g 0 refills

## 2013-10-10 NOTE — Telephone Encounter (Signed)
Med refilled, pt aware 

## 2013-10-10 NOTE — Telephone Encounter (Signed)
Pt called stating the yeast on her inner thigh has not cleared up wants to know if you can prescribe her something else.

## 2014-01-17 ENCOUNTER — Ambulatory Visit (INDEPENDENT_AMBULATORY_CARE_PROVIDER_SITE_OTHER): Payer: Medicaid Other | Admitting: Family Medicine

## 2014-01-17 ENCOUNTER — Encounter: Payer: Self-pay | Admitting: Family Medicine

## 2014-01-17 VITALS — BP 118/80 | HR 80 | Temp 98.5°F | Resp 18

## 2014-01-17 DIAGNOSIS — B372 Candidiasis of skin and nail: Secondary | ICD-10-CM

## 2014-01-17 MED ORDER — FLUCONAZOLE 150 MG PO TABS: 150.0000 mg | ORAL_TABLET | Freq: Once | ORAL | Status: DC

## 2014-01-17 MED ORDER — CLOTRIMAZOLE-BETAMETHASONE 1-0.05 % EX CREA: 1.0000 "application " | TOPICAL_CREAM | Freq: Two times a day (BID) | CUTANEOUS | Status: DC

## 2014-01-17 NOTE — Progress Notes (Signed)
   Subjective:    Patient ID: [name], female    DOB: 1981/08/09, 33 y.o.   MRN: [id number]  HPI Patient has a rash on the medial portion of both upper thighs near the perineum.  It is erythematous with white scale. It has the characteristic appearance of candida intertrigo. She has tried nystatin powder without relief as well as nystatin cream. Past Medical History  Diagnosis Date  . Anxiety   . Irritable bowel syndrome (IBS) .  . Ectopic pregnancy   . Asthma    Current Outpatient Prescriptions on File Prior to Visit  Medication Sig Dispense Refill  . nystatin cream (MYCOSTATIN) Apply 1 application topically 2 (two) times daily.  45 g  0  . Nystatin POWD Apply to affected are 3 times a day  1 Bottle  0   No current facility-administered medications on file prior to visit.   Allergies  Allergen Reactions  . Latex Rash   History   Social History  . Marital Status: Single    Spouse Name: N/A    Number of Children: N/A  . Years of Education: N/A   Occupational History  . Not on file.   Social History Main Topics  . Smoking status: Current Every Day Smoker -- 0.50 packs/day    Types: Cigarettes  . Smokeless tobacco: Never Used  . Alcohol Use: Yes     Comment: social  . Drug Use: Yes    Special: Marijuana  . Sexual Activity: Yes    Birth Control/ Protection: None   Other Topics Concern  . Not on file   Social History Narrative  . No narrative on file      Review of Systems  All other systems reviewed and are negative.       Objective:   Physical Exam  Vitals reviewed. Cardiovascular: Normal rate and regular rhythm.   Pulmonary/Chest: Effort normal and breath sounds normal.  Skin: Rash noted. There is erythema.   intertrigo-like rash on the upper thighs bilaterally        Assessment & Plan:  1. Candidal intertrigo - clotrimazole-betamethasone (LOTRISONE) cream; Apply 1 application topically 2 (two) times daily.  Dispense: 30 g; Refill: 0 -  fluconazole (DIFLUCAN) 150 MG tablet; Take 1 tablet (150 mg total) by mouth once.  Dispense: 1 tablet; Refill: 0  2. Morbid obesity - Amb ref to Medical Nutrition Therapy-MNT

## 2014-02-11 ENCOUNTER — Encounter: Payer: Medicaid Other | Admitting: Family Medicine

## 2014-02-28 ENCOUNTER — Ambulatory Visit: Payer: Medicaid Other | Admitting: Dietician

## 2014-04-01 ENCOUNTER — Ambulatory Visit (INDEPENDENT_AMBULATORY_CARE_PROVIDER_SITE_OTHER): Payer: Medicaid Other | Admitting: Family Medicine

## 2014-04-01 ENCOUNTER — Other Ambulatory Visit: Payer: Self-pay | Admitting: Family Medicine

## 2014-04-01 ENCOUNTER — Encounter: Payer: Self-pay | Admitting: Family Medicine

## 2014-04-01 VITALS — BP 128/78 | HR 88 | Temp 97.5°F | Resp 18 | Ht 68.0 in | Wt 282.0 lb

## 2014-04-01 DIAGNOSIS — F3289 Other specified depressive episodes: Secondary | ICD-10-CM

## 2014-04-01 DIAGNOSIS — F329 Major depressive disorder, single episode, unspecified: Secondary | ICD-10-CM

## 2014-04-01 DIAGNOSIS — F32A Depression, unspecified: Secondary | ICD-10-CM

## 2014-04-01 MED ORDER — DULOXETINE HCL 60 MG PO CPEP: 60.0000 mg | ORAL_CAPSULE | Freq: Every day | ORAL | Status: DC

## 2014-04-01 MED ORDER — DULOXETINE HCL 30 MG PO CPEP: 60.0000 mg | ORAL_CAPSULE | Freq: Every day | ORAL | Status: DC

## 2014-04-01 NOTE — Progress Notes (Signed)
   Subjective:    Patient ID: [name], female    DOB: [birth year], 33 y.o.   MRN: [id number]  HPI  Patient has severe depression for last several months. She reports depression, anhedonia, insomnia, daily anxiety. She's had suicidal thoughts but has stated that she has no plan and no desire to act on his thoughts. He denies any hallucinations or paranoia. She does report panic attacks. She's been self-medicating with marijuana at night. She was diagnosed with depression 15 and was started on Prozac which actually made things worse. She was then treated with Zoloft which worked well for her but lost its effectiveness after the birth of her child. She's interested today in a psychiatry referral. She has no symptoms of mania. Past Medical History  Diagnosis Date  . Anxiety   . Irritable bowel syndrome (IBS) .  . Ectopic pregnancy   . Asthma   . Depression    No current outpatient prescriptions on file prior to visit.   No current facility-administered medications on file prior to visit.   Allergies  Allergen Reactions  . Latex Rash   History   Social History  . Marital Status: Single    Spouse Name: N/A    Number of Children: N/A  . Years of Education: N/A   Occupational History  . Not on file.   Social History Main Topics  . Smoking status: Current Every Day Smoker -- 0.50 packs/day    Types: Cigarettes  . Smokeless tobacco: Never Used  . Alcohol Use: Yes     Comment: social  . Drug Use: Yes    Special: Marijuana  . Sexual Activity: Yes    Birth Control/ Protection: None   Other Topics Concern  . Not on file   Social History Narrative  . No narrative on file     Review of Systems  All other systems reviewed and are negative.      Objective:   Physical Exam  Vitals reviewed. Constitutional: She appears well-developed and well-nourished.  Cardiovascular: Normal rate, regular rhythm and normal heart sounds.   Pulmonary/Chest: Effort normal and breath sounds  normal. No respiratory distress. She has no wheezes. She has no rales.  Abdominal: Soft. Bowel sounds are normal.  Psychiatric: Her speech is normal and behavior is normal. Judgment and thought content normal. Cognition and memory are normal. She exhibits a depressed mood.          Assessment & Plan:  1. Depression Begin Cymbalta 30 mg by mouth daily for one-week and then increase to 60 mg by mouth daily. I will refer patient to psychiatry and she request. I will follow up in 4 weeks or sooner if worse. -DULoxetine (CYMBALTA) 30 MG capsule; Take 2 capsules (60 mg total) by mouth daily.  Dispense: 60 capsule; Refill: 3 - Ambulatory referral to Psychiatry

## 2014-05-09 ENCOUNTER — Telehealth: Payer: Self-pay | Admitting: Physician Assistant

## 2014-05-09 MED ORDER — FLUCONAZOLE 150 MG PO TABS: 150.0000 mg | ORAL_TABLET | Freq: Once | ORAL | Status: DC

## 2014-05-09 NOTE — Telephone Encounter (Signed)
Rx sent and pt aware 

## 2014-05-09 NOTE — Telephone Encounter (Signed)
[phone] Pharmacy is [facility]  Patient is requesting refill on her diflucan if possible she is having the same thing going on as last time she was here for rash in between her legs

## 2014-05-09 NOTE — Telephone Encounter (Signed)
ok 

## 2014-06-17 ENCOUNTER — Telehealth: Payer: Self-pay | Admitting: Physician Assistant

## 2014-06-17 NOTE — Telephone Encounter (Signed)
PATIENT WOULD LIKE TO KNOW IF WE CAN CALL HER IN RX FOR DIFLUCAN IF POSSIBLE  [facility]  CALL AND LET HER KNOW [phone]

## 2014-06-17 NOTE — Telephone Encounter (Signed)
Ok diflucan 150 po x1 

## 2014-06-18 MED ORDER — FLUCONAZOLE 150 MG PO TABS: 150.0000 mg | ORAL_TABLET | Freq: Once | ORAL | Status: DC

## 2014-06-18 NOTE — Telephone Encounter (Signed)
Rx to pharmacy and pt made aware. 

## 2014-08-21 ENCOUNTER — Telehealth: Payer: Self-pay | Admitting: Physician Assistant

## 2014-08-21 MED ORDER — NYSTATIN 100000 UNIT/GM EX CREA: 1.0000 "application " | TOPICAL_CREAM | Freq: Two times a day (BID) | CUTANEOUS | Status: DC

## 2014-08-21 MED ORDER — FLUCONAZOLE 150 MG PO TABS: 150.0000 mg | ORAL_TABLET | Freq: Once | ORAL | Status: DC

## 2014-08-21 NOTE — Telephone Encounter (Signed)
Send prescription for Diflucan 150 mg 1 by mouth x1----# 1 with 0 refills Also send prescription for nystatin cream--apply to area twice a day--- 45 g with 3 refills Tell patient there is no need for referral for this. Even a specialist is not going to treat this any differently.

## 2014-08-21 NOTE — Telephone Encounter (Signed)
Pt aware of provider recommendations.  Rx to pharmacy

## 2014-08-21 NOTE — Telephone Encounter (Signed)
[facility]  Patient is calling to get rx for her impetigo if possible  And would to know if she could get maybe a referral for this, since it has been going on for 6 months or so

## 2014-11-06 ENCOUNTER — Telehealth: Payer: Self-pay | Admitting: Physician Assistant

## 2014-11-06 NOTE — Telephone Encounter (Signed)
Patient is calling

## 2014-11-11 ENCOUNTER — Ambulatory Visit (INDEPENDENT_AMBULATORY_CARE_PROVIDER_SITE_OTHER): Payer: Medicaid Other | Admitting: Family Medicine

## 2014-11-11 ENCOUNTER — Encounter: Payer: Self-pay | Admitting: Family Medicine

## 2014-11-11 VITALS — BP 108/66 | HR 60 | Temp 98.2°F | Resp 18 | Wt 285.0 lb

## 2014-11-11 DIAGNOSIS — B372 Candidiasis of skin and nail: Secondary | ICD-10-CM

## 2014-11-11 LAB — COMPLETE METABOLIC PANEL WITH GFR
ALT: 30 U/L (ref 0–35)
AST: 23 U/L (ref 0–37)
Albumin: 3.6 g/dL (ref 3.5–5.2)
Alkaline Phosphatase: 73 U/L (ref 39–117)
BUN: 7 mg/dL (ref 6–23)
CALCIUM: 8.6 mg/dL (ref 8.4–10.5)
CHLORIDE: 108 meq/L (ref 96–112)
CO2: 26 meq/L (ref 19–32)
CREATININE: 0.55 mg/dL (ref 0.50–1.10)
GFR, Est African American: >89 mL/min
Glucose, Bld: 85 mg/dL (ref 70–99)
Potassium: 3.9 mEq/L (ref 3.5–5.3)
Sodium: 140 mEq/L (ref 135–145)
Total Bilirubin: 0.4 mg/dL (ref 0.2–1.2)
Total Protein: 5.9 g/dL — ABNORMAL LOW (ref 6.0–8.3)

## 2014-11-11 LAB — HEMOGLOBIN A1C
HEMOGLOBIN A1C: 5.1 % (ref <5.7)
Mean Plasma Glucose: 100 mg/dL (ref <117)

## 2014-11-11 LAB — CBC WITH DIFFERENTIAL/PLATELET
BASOS ABS: 0 10*3/uL (ref 0.0–0.1)
BASOS PCT: 0 % (ref 0–1)
EOS ABS: 0.2 10*3/uL (ref 0.0–0.7)
Eosinophils Relative: 2 % (ref 0–5)
HCT: 38.1 % (ref 36.0–46.0)
Hemoglobin: 12.9 g/dL (ref 12.0–15.0)
Lymphocytes Relative: 26 % (ref 12–46)
Lymphs Abs: 2.1 10*3/uL (ref 0.7–4.0)
MCH: 27.5 pg (ref 26.0–34.0)
MCHC: 33.9 g/dL (ref 30.0–36.0)
MCV: 81.2 fL (ref 78.0–100.0)
MONOS PCT: 5 % (ref 3–12)
MPV: 10.1 fL (ref 9.4–12.4)
Monocytes Absolute: 0.4 10*3/uL (ref 0.1–1.0)
Neutro Abs: 5.4 10*3/uL (ref 1.7–7.7)
Neutrophils Relative %: 67 % (ref 43–77)
PLATELETS: 192 10*3/uL (ref 150–400)
RBC: 4.69 MIL/uL (ref 3.87–5.11)
RDW: 13.3 % (ref 11.5–15.5)
WBC: 8.1 10*3/uL (ref 4.0–10.5)

## 2014-11-11 MED ORDER — FLUCONAZOLE 150 MG PO TABS: 150.0000 mg | ORAL_TABLET | Freq: Once | ORAL | Status: DC

## 2014-11-11 MED ORDER — CLOTRIMAZOLE-BETAMETHASONE 1-0.05 % EX CREA: 1.0000 "application " | TOPICAL_CREAM | Freq: Two times a day (BID) | CUTANEOUS | Status: DC

## 2014-11-11 NOTE — Progress Notes (Signed)
   Subjective:    Patient ID: [name], female    DOB: 07-26-1981, 33 y.o.   MRN: [id number]  HPI I have seen the patient once in February. At that time she had candida intertrigo. Patient states that when she takes Diflucan irritation goes away. However she is a recurrent infection almost every month in this area. Again on pelvic examination today there is erythema in the intertriginous folds between her perineum and her thighs. There is no evidence of cellulitis. There is no fissuring or lichenification. It is mildly pink erythematous skin. There is also an erythematous patch around the introitus. All this is consistent with mild irritation secondary used. The patient has morbid obesity. She also has hyperglycemia all lab work last checked in 2014. I am concerned about possible diabetes as a cause of her recurrent intertrigo. Past Medical History  Diagnosis Date  . Anxiety   . Irritable bowel syndrome (IBS) .  . Ectopic pregnancy   . Asthma   . Depression    Current Outpatient Prescriptions on File Prior to Visit  Medication Sig Dispense Refill  . DULoxetine (CYMBALTA) 60 MG capsule Take 1 capsule (60 mg total) by mouth daily. 30 capsule 3  . nystatin cream (MYCOSTATIN) Apply 1 application topically 2 (two) times daily. Apply to effected areas twice daily as needed 45 g 3  . fluconazole (DIFLUCAN) 150 MG tablet Take 1 tablet (150 mg total) by mouth once. (Patient not taking: Reported on 11/11/2014) 1 tablet 0   No current facility-administered medications on file prior to visit.   Allergies  Allergen Reactions  . Latex Rash   History   Social History  . Marital Status: Single    Spouse Name: N/A    Number of Children: N/A  . Years of Education: N/A   Occupational History  . Not on file.   Social History Main Topics  . Smoking status: Current Every Day Smoker -- 0.50 packs/day    Types: Cigarettes  . Smokeless tobacco: Never Used  . Alcohol Use: Yes     Comment: social  .  Drug Use: Yes    Special: Marijuana  . Sexual Activity: Yes    Birth Control/ Protection: None   Other Topics Concern  . Not on file   Social History Narrative      Review of Systems  All other systems reviewed and are negative.      Objective:   Physical Exam  Cardiovascular: Normal rate, regular rhythm and normal heart sounds.   Pulmonary/Chest: Effort normal and breath sounds normal.  Abdominal: Soft. Bowel sounds are normal.  Genitourinary: Vagina normal. No breast swelling or discharge. There is rash on the right labia. There is no lesion on the right labia. There is rash on the left labia. No vaginal discharge found.  Skin: Rash noted. There is erythema.  Vitals reviewed.         Assessment & Plan:  Candidal intertrigo - Plan: fluconazole (DIFLUCAN) 150 MG tablet, clotrimazole-betamethasone (LOTRISONE) cream  Morbid obesity - Plan: fluconazole (DIFLUCAN) 150 MG tablet, clotrimazole-betamethasone (LOTRISONE) cream, COMPLETE METABOLIC PANEL WITH GFR, Hemoglobin A1c  Treat with Diflucan 150 mg by mouth 1 and then to zone cream twice a day for 10 days. I recommended good personal hygiene to try to keep this area dry and the future to help prevent this. Also recommended weight loss. I will also screen the patient for type 2 diabetes mellitus.

## 2014-11-11 NOTE — Addendum Note (Signed)
Addended by: Donne AnonPLUMMER, KIM M on: 11/11/2014 10:39 AM   Modules accepted: Orders

## 2014-11-13 ENCOUNTER — Encounter: Payer: Self-pay | Admitting: *Deleted

## 2014-12-02 ENCOUNTER — Encounter (HOSPITAL_COMMUNITY): Payer: Self-pay | Admitting: Emergency Medicine

## 2014-12-02 ENCOUNTER — Emergency Department (HOSPITAL_COMMUNITY)
Admission: EM | Admit: 2014-12-02 | Discharge: 2014-12-02 | Disposition: A | Discharge status: Home / Self Care | Payer: Medicaid Other | Attending: Emergency Medicine | Admitting: Emergency Medicine

## 2014-12-02 DIAGNOSIS — H109 Unspecified conjunctivitis: Secondary | ICD-10-CM

## 2014-12-02 DIAGNOSIS — H578 Other specified disorders of eye and adnexa: Secondary | ICD-10-CM | POA: Diagnosis present

## 2014-12-02 DIAGNOSIS — F329 Major depressive disorder, single episode, unspecified: Secondary | ICD-10-CM | POA: Diagnosis not present

## 2014-12-02 DIAGNOSIS — Z91040 Latex allergy status: Secondary | ICD-10-CM | POA: Diagnosis not present

## 2014-12-02 DIAGNOSIS — Z7952 Long term (current) use of systemic steroids: Secondary | ICD-10-CM | POA: Insufficient documentation

## 2014-12-02 DIAGNOSIS — J45909 Unspecified asthma, uncomplicated: Secondary | ICD-10-CM | POA: Diagnosis not present

## 2014-12-02 DIAGNOSIS — F419 Anxiety disorder, unspecified: Secondary | ICD-10-CM | POA: Insufficient documentation

## 2014-12-02 DIAGNOSIS — Z720 Tobacco use: Secondary | ICD-10-CM | POA: Insufficient documentation

## 2014-12-02 DIAGNOSIS — Z79899 Other long term (current) drug therapy: Secondary | ICD-10-CM | POA: Insufficient documentation

## 2014-12-02 DIAGNOSIS — Z8719 Personal history of other diseases of the digestive system: Secondary | ICD-10-CM | POA: Insufficient documentation

## 2014-12-02 MED ORDER — ERYTHROMYCIN 5 MG/GM OP OINT
1.0000 "application " | TOPICAL_OINTMENT | Freq: Once | OPHTHALMIC | Status: AC
  Administered 2014-12-02: 1 via OPHTHALMIC
  Filled 2014-12-02: qty 3.5

## 2014-12-02 NOTE — Discharge Instructions (Signed)
Bacterial Conjunctivitis °Bacterial conjunctivitis, commonly called pink eye, is an inflammation of the clear membrane that covers the white part of the eye (conjunctiva). The inflammation can also happen on the underside of the eyelids. The blood vessels in the conjunctiva become inflamed, causing the eye to become red or pink. Bacterial conjunctivitis may spread easily from one eye to another and from person to person (contagious).  °CAUSES  °Bacterial conjunctivitis is caused by bacteria. The bacteria may come from your own skin, your upper respiratory tract, or from someone else with bacterial conjunctivitis. °SYMPTOMS  °The normally white color of the eye or the underside of the eyelid is usually pink or red. The pink eye is usually associated with irritation, tearing, and some sensitivity to light. Bacterial conjunctivitis is often associated with a thick, yellowish discharge from the eye. The discharge may turn into a crust on the eyelids overnight, which causes your eyelids to stick together. If a discharge is present, there may also be some blurred vision in the affected eye. °DIAGNOSIS  °Bacterial conjunctivitis is diagnosed by your caregiver through an eye exam and the symptoms that you report. Your caregiver looks for changes in the surface tissues of your eyes, which may point to the specific type of conjunctivitis. A sample of any discharge may be collected on a cotton-tip swab if you have a severe case of conjunctivitis, if your cornea is affected, or if you keep getting repeat infections that do not respond to treatment. The sample will be sent to a lab to see if the inflammation is caused by a bacterial infection and to see if the infection will respond to antibiotic medicines. °TREATMENT  °· Bacterial conjunctivitis is treated with antibiotics. Antibiotic eyedrops are most often used. However, antibiotic ointments are also available. Antibiotics pills are sometimes used. Artificial tears or eye  washes may ease discomfort. °HOME CARE INSTRUCTIONS  °· To ease discomfort, apply a cool, clean washcloth to your eye for 10-20 minutes, 3-4 times a day. °· Gently wipe away any drainage from your eye with a warm, wet washcloth or a cotton ball. °· Wash your hands often with soap and water. Use paper towels to dry your hands. °· Do not share towels or washcloths. This may spread the infection. °· Change or wash your pillowcase every day. °· You should not use eye makeup until the infection is gone. °· Do not operate machinery or drive if your vision is blurred. °· Stop using contact lenses. Ask your caregiver how to sterilize or replace your contacts before using them again. This depends on the type of contact lenses that you use. °· When applying medicine to the infected eye, do not touch the edge of your eyelid with the eyedrop bottle or ointment tube. °SEEK IMMEDIATE MEDICAL CARE IF:  °· Your infection has not improved within 3 days after beginning treatment. °· You had yellow discharge from your eye and it returns. °· You have increased eye pain. °· Your eye redness is spreading. °· Your vision becomes blurred. °· You have a fever or persistent symptoms for more than 2-3 days. °· You have a fever and your symptoms suddenly get worse. °· You have facial pain, redness, or swelling. °MAKE SURE YOU:  °· Understand these instructions. °· Will watch your condition. °· Will get help right away if you are not doing well or get worse. °Document Released: 11/14/2005 Document Revised: 03/31/2014 Document Reviewed: 04/16/2012 °[employer]® Patient Information ©2015 [employer]. This information is not intended to   replace advice given to you by your health care provider. Make sure you discuss any questions you have with your health care provider. Use the supplied ointment as follows one quarter in stripe of the alignment inside the lower lid 4 times a day until the eye is clear of redness and exudate for 24 hours

## 2014-12-02 NOTE — ED Provider Notes (Signed)
CSN: [id number]     Arrival date & time 12/02/14  1954 History   First MD Initiated Contact with Patient 12/02/14 2031     Chief Complaint  Patient presents with  . Conjunctivitis     (Consider location/radiation/quality/duration/timing/severity/associated sxs/prior Treatment) HPI Comments: Patient started with tearing and slight erythema to the lower eyelid 2 days ago that has extended to include the upper lid as well as a purulent discharge from the left eye only denies any URI symptoms.  Denies any trauma, visual changes. Does not wear contact lenses.  Has no known contacts with conjunctivitis.  Patient is a 34 y.o. female presenting with conjunctivitis. The history is provided by the patient.  Conjunctivitis This is a new problem. The current episode started yesterday. The problem occurs constantly. The problem has been unchanged. Pertinent negatives include no chills, congestion, coughing, fever, headaches, rash or visual change. Nothing aggravates the symptoms. She has tried nothing for the symptoms. The treatment provided no relief.    Past Medical History  Diagnosis Date  . Anxiety   . Irritable bowel syndrome (IBS) .  . Ectopic pregnancy   . Asthma   . Depression    Past Surgical History  Procedure Laterality Date  . Tubal ligation     No family history on file. History  Substance Use Topics  . Smoking status: Current Every Day Smoker -- 0.50 packs/day    Types: Cigarettes  . Smokeless tobacco: Never Used  . Alcohol Use: Yes     Comment: social   OB History    No data available     Review of Systems  Constitutional: Negative for fever and chills.  HENT: Negative for congestion and rhinorrhea.   Eyes: Positive for discharge, redness and itching. Negative for photophobia, pain and visual disturbance.  Respiratory: Negative for cough.   Skin: Negative for rash.  Neurological: Negative for headaches.  All other systems reviewed and are negative.     Allergies   Latex  Home Medications   Prior to Admission medications   Medication Sig Start Date End Date Taking? Authorizing Provider  clotrimazole-betamethasone (LOTRISONE) cream Apply 1 application topically 2 (two) times daily. 11/11/14   Donita BrooksWarren T Pickard, MD  DULoxetine (CYMBALTA) 60 MG capsule Take 1 capsule (60 mg total) by mouth daily. 04/01/14   Donita BrooksWarren T Pickard, MD  fluconazole (DIFLUCAN) 150 MG tablet Take 1 tablet (150 mg total) by mouth once. Patient not taking: Reported on 11/11/2014 08/21/14   Dorena BodoMary B Dixon, PA-C  fluconazole (DIFLUCAN) 150 MG tablet Take 1 tablet (150 mg total) by mouth once. 11/11/14   Donita BrooksWarren T Pickard, MD  nystatin cream (MYCOSTATIN) Apply 1 application topically 2 (two) times daily. Apply to effected areas twice daily as needed 08/21/14   Dorena BodoMary B Dixon, PA-C   BP 170/75 mmHg  Pulse 78  Temp(Src) 98 F (36.7 C) (Oral)  Resp 18  Ht 5\' 8"  (1.727 m)  Wt 284 lb (128.822 kg)  BMI 43.19 kg/m2  SpO2 96%  LMP 11/11/2014 Physical Exam  Constitutional: She appears well-developed and well-nourished.  HENT:  Head: Normocephalic and atraumatic.  Right Ear: External ear normal.  Left Ear: External ear normal.  Mouth/Throat: Oropharynx is clear and moist.  Eyes: EOM are normal. Pupils are equal, round, and reactive to light. Left eye exhibits discharge. Left conjunctiva is injected. Left eye exhibits no nystagmus.    No pain with eye movement  Nursing note and vitals reviewed.   ED Course  Procedures (  including critical care time) Labs Review Labs Reviewed - No data to display  Imaging Review No results found.   EKG Interpretation None      MDM   Final diagnoses:  Conjunctivitis of left eye         Arman Filter, NP 12/02/14 2042  Benny Lennert, MD 12/05/14 1325

## 2014-12-02 NOTE — ED Notes (Signed)
Pt. reports left eye redness with drainage/matted onset 2 days ago , denies injury / no blurred vision .

## 2014-12-03 ENCOUNTER — Other Ambulatory Visit: Payer: Medicaid Other

## 2014-12-03 DIAGNOSIS — Z0189 Encounter for other specified special examinations: Secondary | ICD-10-CM

## 2014-12-04 ENCOUNTER — Ambulatory Visit (INDEPENDENT_AMBULATORY_CARE_PROVIDER_SITE_OTHER): Payer: Medicaid Other | Admitting: Family Medicine

## 2014-12-04 ENCOUNTER — Encounter: Payer: Self-pay | Admitting: Family Medicine

## 2014-12-04 VITALS — BP 110/68 | HR 80 | Temp 98.3°F | Resp 18 | Ht 68.0 in | Wt 277.0 lb

## 2014-12-04 DIAGNOSIS — Z0000 Encounter for general adult medical examination without abnormal findings: Secondary | ICD-10-CM

## 2014-12-04 DIAGNOSIS — H109 Unspecified conjunctivitis: Secondary | ICD-10-CM

## 2014-12-04 DIAGNOSIS — R350 Frequency of micturition: Secondary | ICD-10-CM

## 2014-12-04 LAB — URINALYSIS, ROUTINE W REFLEX MICROSCOPIC
Glucose, UA: NEGATIVE mg/dL
HGB URINE DIPSTICK: NEGATIVE
KETONES UR: NEGATIVE mg/dL
Leukocytes, UA: NEGATIVE
NITRITE: NEGATIVE
PH: 5.5 (ref 5.0–8.0)
Protein, ur: NEGATIVE mg/dL
Specific Gravity, Urine: 1.02 (ref 1.005–1.030)
UROBILINOGEN UA: 0.2 mg/dL (ref 0.0–1.0)

## 2014-12-04 LAB — MICROALBUMIN / CREATININE URINE RATIO
Creatinine, Urine: 246.9 mg/dL
MICROALB/CREAT RATIO: 6.9 mg/g (ref 0.0–30.0)
Microalb, Ur: 1.7 mg/dL (ref <2.0)

## 2014-12-04 MED ORDER — TOBRAMYCIN-DEXAMETHASONE 0.3-0.1 % OP OINT: 1.0000 "application " | TOPICAL_OINTMENT | Freq: Three times a day (TID) | OPHTHALMIC | Status: DC

## 2014-12-04 MED ORDER — SULFAMETHOXAZOLE-TRIMETHOPRIM 400-80 MG PO TABS: 1.0000 | ORAL_TABLET | Freq: Two times a day (BID) | ORAL | Status: DC

## 2014-12-04 NOTE — Progress Notes (Signed)
 Subjective:    Patient ID: [name], female    DOB: 08/09/1981, 34 y.o.   MRN: [id number]  HPI Patient has had panic attacks recently and increasing stress and anxiety. She has lost several pounds due to lack of appetite. Coincidentally she discontinued her Cymbalta abruptly at Christmas time. She just recently started back on the medication. On the medication she was doing well. Unfortunately she has had conjunctivitis in her left eye she went to emergency room and was given erythromycin ointment which has not helped. Also seen exam today there are no dendrites. However the erythema is pronounced and is now spreading to the skin surrounding the eye. She has swelling in the upper and lower eyelids as well as the left cheek. Office Visit on 12/04/2014  Component Date Value Ref Range Status  . Color, Urine 12/04/2014 YELLOW  YELLOW Final  . APPearance 12/04/2014 CLEAR  CLEAR Final  . Specific Gravity, Urine 12/04/2014 1.020  1.005 - 1.030 Final  . pH 12/04/2014 5.5  5.0 - 8.0 Final  . Glucose, UA 12/04/2014 NEG  NEG mg/dL Final  . Bilirubin Urine 12/04/2014 SMALL* NEG Final  . Ketones, ur 12/04/2014 NEG  NEG mg/dL Final  . Hgb urine dipstick 12/04/2014 NEG  NEG Final  . Protein, ur 12/04/2014 NEG  NEG mg/dL Final  . Urobilinogen, UA 12/04/2014 0.2  0.0 - 1.0 mg/dL Final  . Nitrite 12/04/2014 NEG  NEG Final  . Leukocytes, UA 12/04/2014 NEG  NEG Final  Lab on 12/03/2014  Component Date Value Ref Range Status  . Microalb, Ur 12/03/2014 1.7  <2.0 mg/dL Final   Comment: ** Please note change in reference range(s). ** The ADA (Diabetes Care 2014;37(suppl 1):S14-S80) has defined abnormalities in albumin excretion as follows:            Category           Result                            (mg/g creatinine)                 Normal:    <30       Microalbuminuria:    30 - 299   Clinical albuminuria:    > or = 300    The ADA recommends that at least two of three specimens collected within a 3  - 6 month period be abnormal before considering a patient to be within a diagnostic category.   . Creatinine, Urine 12/03/2014 246.9   Final   No reference range established.  . Microalb Creat Ratio 12/03/2014 6.9  0.0 - 30.0 mg/g Final  Office Visit on 11/11/2014  Component Date Value Ref Range Status  . Sodium 11/11/2014 140  135 - 145 mEq/L Final  . Potassium 11/11/2014 3.9  3.5 - 5.3 mEq/L Final  . Chloride 11/11/2014 108  96 - 112 mEq/L Final  . CO2 11/11/2014 26  19 - 32 mEq/L Final  . Glucose, Bld 11/11/2014 85  70 - 99 mg/dL Final  . BUN 11/11/2014 7  6 - 23 mg/dL Final  . Creat 11/11/2014 0.55  0.50 - 1.10 mg/dL Final  . Total Bilirubin 11/11/2014 0.4  0.2 - 1.2 mg/dL Final  . Alkaline Phosphatase 11/11/2014 73  39 - 117 U/L Final  . AST 11/11/2014 23  0 - 37 U/L Final  . ALT 11/11/2014 30  0 - 35 U/L Final  .   Total Protein 11/11/2014 5.9* 6.0 - 8.3 g/dL Final  . Albumin 11/11/2014 3.6  3.5 - 5.2 g/dL Final  . Calcium 11/11/2014 8.6  8.4 - 10.5 mg/dL Final  . GFR, Est African American 11/11/2014 >89   Final  . GFR, Est Non African American 11/11/2014 >89   Final   Comment:   The estimated GFR is a calculation valid for adults (>=18 years old) that uses the CKD-EPI algorithm to adjust for age and sex. It is   not to be used for children, pregnant women, hospitalized patients,    patients on dialysis, or with rapidly changing kidney function. According to the NKDEP, eGFR >89 is normal, 60-89 shows mild impairment, 30-59 shows moderate impairment, 15-29 shows severe impairment and <15 is ESRD.     . Hgb A1c MFr Bld 11/11/2014 5.1  <5.7 % Final   Comment:                                                                        According to the ADA Clinical Practice Recommendations for 2011, when HbA1c is used as a screening test:     >=6.5%   Diagnostic of Diabetes Mellitus            (if abnormal result is confirmed)   5.7-6.4%   Increased risk of developing Diabetes  [place]   References:Diagnosis and Classification of Diabetes Mellitus,Diabetes Care,2011,34(Suppl 1):S62-S69 and Standards of Medical Care in         Diabetes - 2011,Diabetes Care,2011,34 (Suppl 1):S11-S61.     . Mean Plasma Glucose 11/11/2014 100  <117 mg/dL Final  . WBC 11/11/2014 8.1  4.0 - 10.5 K/uL Final  . RBC 11/11/2014 4.69  3.87 - 5.11 MIL/uL Final  . Hemoglobin 11/11/2014 12.9  12.0 - 15.0 g/dL Final  . HCT 11/11/2014 38.1  36.0 - 46.0 % Final  . MCV 11/11/2014 81.2  78.0 - 100.0 fL Final  . MCH 11/11/2014 27.5  26.0 - 34.0 pg Final  . MCHC 11/11/2014 33.9  30.0 - 36.0 g/dL Final  . RDW 11/11/2014 13.3  11.5 - 15.5 % Final  . Platelets 11/11/2014 192  150 - 400 K/uL Final  . MPV 11/11/2014 10.1  9.4 - 12.4 fL Final  . Neutrophils Relative % 11/11/2014 67  43 - 77 % Final  . Neutro Abs 11/11/2014 5.4  1.7 - 7.7 K/uL Final  . Lymphocytes Relative 11/11/2014 26  12 - 46 % Final  . Lymphs Abs 11/11/2014 2.1  0.7 - 4.0 K/uL Final  . Monocytes Relative 11/11/2014 5  3 - 12 % Final  . Monocytes Absolute 11/11/2014 0.4  0.1 - 1.0 K/uL Final  . Eosinophils Relative 11/11/2014 2  0 - 5 % Final  . Eosinophils Absolute 11/11/2014 0.2  0.0 - 0.7 K/uL Final  . Basophils Relative 11/11/2014 0  0 - 1 % Final  . Basophils Absolute 11/11/2014 0.0  0.0 - 0.1 K/uL Final  . Smear Review 11/11/2014 Criteria for review not met   Final   Past Medical History  Diagnosis Date  . Anxiety   . Irritable bowel syndrome (IBS) .  . Ectopic pregnancy   . Asthma   . Depression    Past   Surgical History  Procedure Laterality Date  . Tubal ligation     Current Outpatient Prescriptions on File Prior to Visit  Medication Sig Dispense Refill  . clotrimazole-betamethasone (LOTRISONE) cream Apply 1 application topically 2 (two) times daily. 30 g 0  . DULoxetine (CYMBALTA) 60 MG capsule Take 1 capsule (60 mg total) by mouth daily. 30 capsule 3   No current facility-administered medications on file  prior to visit.   Allergies  Allergen Reactions  . Latex Rash   History   Social History  . Marital Status: Single    Spouse Name: N/A    Number of Children: N/A  . Years of Education: N/A   Occupational History  . Not on file.   Social History Main Topics  . Smoking status: Current Every Day Smoker -- 0.50 packs/day    Types: Cigarettes  . Smokeless tobacco: Never Used  . Alcohol Use: Yes     Comment: social  . Drug Use: Yes    Special: Marijuana  . Sexual Activity: Yes    Birth Control/ Protection: None   Other Topics Concern  . Not on file   Social History Narrative   No family history on file.    Review of Systems  All other systems reviewed and are negative.      Objective:   Physical Exam  Constitutional: She is oriented to person, place, and time. She appears well-developed and well-nourished. No distress.  HENT:  Head: Normocephalic and atraumatic.  Right Ear: External ear normal.  Left Ear: External ear normal.  Nose: Nose normal.  Mouth/Throat: Oropharynx is clear and moist. No oropharyngeal exudate.  Eyes: EOM are normal. Pupils are equal, round, and reactive to light. Left eye exhibits discharge and exudate. Left conjunctiva is injected.  Neck: Normal range of motion. Neck supple. No JVD present. No thyromegaly present.  Cardiovascular: Normal rate, regular rhythm and normal heart sounds.  Exam reveals no gallop and no friction rub.   No murmur heard. Pulmonary/Chest: Effort normal and breath sounds normal. No respiratory distress. She has no wheezes. She has no rales. She exhibits no tenderness.  Abdominal: Soft. Bowel sounds are normal. She exhibits no distension and no mass. There is no tenderness. There is no rebound and no guarding.  Genitourinary: Vagina normal and uterus normal.  Musculoskeletal: Normal range of motion. She exhibits no edema or tenderness.  Lymphadenopathy:    She has no cervical adenopathy.  Neurological: She is alert  and oriented to person, place, and time. She has normal reflexes. She displays normal reflexes. No cranial nerve deficit. She exhibits normal muscle tone. Coordination normal.  Skin: Skin is warm. No rash noted. She is not diaphoretic. No erythema. No pallor.  Psychiatric: She has a normal mood and affect. Her behavior is normal. Judgment and thought content normal.  Vitals reviewed.         Assessment & Plan:  Frequent urination - Plan: Urinalysis, Routine w reflex microscopic  Conjunctivitis of left eye - Plan: sulfamethoxazole-trimethoprim (BACTRIM) 400-80 MG per tablet, tobramycin-dexamethasone (TOBRADEX) ophthalmic ointment  Routine general medical examination at a health care facility - Plan: PAP, Thin Prep w/HPV rflx HPV Type 16/18  Patient's physical exam is normal. I will treat her conjunctivitis with a combination of TobraDex drops 3 times a day as well as Bactrim to cover the preseptal cellulitis. Recheck next week if no better or sooner if worse. Pap smear was sent to pathology. The remainder of her blood work is normal. I   recommended diet exercise and weight loss. Also recommended she resume her Cymbalta to be consistent in taking it because a believe her anxiety is due to withdrawal.

## 2014-12-08 LAB — PAP, THIN PREP W/HPV RFLX HPV TYPE 16/18: HPV DNA High Risk: NOT DETECTED

## 2014-12-09 ENCOUNTER — Encounter: Payer: Self-pay | Admitting: Family Medicine

## 2014-12-16 ENCOUNTER — Emergency Department (HOSPITAL_COMMUNITY)
Admission: EM | Admit: 2014-12-16 | Discharge: 2014-12-16 | Disposition: A | Discharge status: Home / Self Care | Payer: Medicaid Other | Attending: Emergency Medicine | Admitting: Emergency Medicine

## 2014-12-16 ENCOUNTER — Encounter (HOSPITAL_COMMUNITY): Payer: Self-pay | Admitting: Physical Medicine and Rehabilitation

## 2014-12-16 DIAGNOSIS — F419 Anxiety disorder, unspecified: Secondary | ICD-10-CM | POA: Insufficient documentation

## 2014-12-16 DIAGNOSIS — Z91040 Latex allergy status: Secondary | ICD-10-CM | POA: Diagnosis not present

## 2014-12-16 DIAGNOSIS — J45909 Unspecified asthma, uncomplicated: Secondary | ICD-10-CM | POA: Insufficient documentation

## 2014-12-16 DIAGNOSIS — K0889 Other specified disorders of teeth and supporting structures: Secondary | ICD-10-CM

## 2014-12-16 DIAGNOSIS — K088 Other specified disorders of teeth and supporting structures: Secondary | ICD-10-CM | POA: Diagnosis present

## 2014-12-16 DIAGNOSIS — Z720 Tobacco use: Secondary | ICD-10-CM | POA: Insufficient documentation

## 2014-12-16 DIAGNOSIS — F329 Major depressive disorder, single episode, unspecified: Secondary | ICD-10-CM | POA: Diagnosis not present

## 2014-12-16 DIAGNOSIS — Z792 Long term (current) use of antibiotics: Secondary | ICD-10-CM | POA: Diagnosis not present

## 2014-12-16 DIAGNOSIS — Z79899 Other long term (current) drug therapy: Secondary | ICD-10-CM | POA: Diagnosis not present

## 2014-12-16 MED ORDER — PENICILLIN V POTASSIUM 500 MG PO TABS: 500.0000 mg | ORAL_TABLET | Freq: Four times a day (QID) | ORAL | Status: DC

## 2014-12-16 MED ORDER — HYDROCODONE-ACETAMINOPHEN 5-325 MG PO TABS
2.0000 | ORAL_TABLET | Freq: Once | ORAL | Status: DC
  Filled 2014-12-16: qty 2

## 2014-12-16 MED ORDER — PENICILLIN V POTASSIUM 250 MG PO TABS
500.0000 mg | ORAL_TABLET | Freq: Once | ORAL | Status: AC
  Administered 2014-12-16: 500 mg via ORAL
  Filled 2014-12-16: qty 2

## 2014-12-16 MED ORDER — HYDROCODONE-ACETAMINOPHEN 5-325 MG PO TABS: 2.0000 | ORAL_TABLET | ORAL | Status: DC | PRN

## 2014-12-16 NOTE — ED Provider Notes (Signed)
CSN: [id number]     Arrival date & time 12/16/14  4098 History  This chart was scribed for non-physician practitioner, Emilia Beck, PA-C, working with Toy Baker, MD by Charline Bills, ED Scribe. This patient was seen in room TR10C/TR10C and the patient's care was started at 9:20 AM.   Chief Complaint  Patient presents with  . Dental Pain   The history is provided by the patient. No language interpreter was used.   HPI Comments: [name] is a 34 y.o. female, with a h/o anxiety, depression, IBS, asthma, who presents to the Emergency Department complaining of ongoing, intermittent L lower dental pain for the past year, constant for the past 4 days. She denies fever and gum swelling. Pt has been treating with ibuprofen and mouth wash without relief. No dentist.   Past Medical History  Diagnosis Date  . Anxiety   . Irritable bowel syndrome (IBS) .  . Ectopic pregnancy   . Asthma   . Depression    Past Surgical History  Procedure Laterality Date  . Tubal ligation     No family history on file. History  Substance Use Topics  . Smoking status: Current Every Day Smoker -- 0.50 packs/day    Types: Cigarettes  . Smokeless tobacco: Never Used  . Alcohol Use: Yes     Comment: social   OB History    No data available     Review of Systems  Constitutional: Negative for fever.  HENT: Positive for dental problem.   All other systems reviewed and are negative.  Allergies  Latex  Home Medications   Prior to Admission medications   Medication Sig Start Date End Date Taking? Authorizing Provider  clotrimazole-betamethasone (LOTRISONE) cream Apply 1 application topically 2 (two) times daily. 11/11/14   Donita Brooks, MD  DULoxetine (CYMBALTA) 60 MG capsule Take 1 capsule (60 mg total) by mouth daily. 04/01/14   Donita Brooks, MD  erythromycin ophthalmic ointment Place 1 application into the left eye 4 (four) times daily.    Historical Provider, MD   sulfamethoxazole-trimethoprim (BACTRIM) 400-80 MG per tablet Take 1 tablet by mouth 2 (two) times daily. 12/04/14   Donita Brooks, MD  tobramycin-dexamethasone [facility]) ophthalmic ointment Place 1 application into the left eye 3 (three) times daily. 12/04/14   Donita Brooks, MD   Triage Vitals: BP 124/72 mmHg  Pulse 84  Temp(Src) 97.9 F (36.6 C) (Oral)  Resp 18  Ht  (1.727 m)  Wt 175 lb (79.379 kg)  BMI 26.61 kg/m2  SpO2 99%  LMP 11/11/2014 Physical Exam  Constitutional: She is oriented to person, place, and time. She appears well-developed and well-nourished. No distress.  HENT:  Head: Normocephalic and atraumatic.  Mouth/Throat: No trismus in the jaw.  Fair dentition. Tenderness to percussion of L lower premolar. No abscess noted.   Eyes: Conjunctivae and EOM are normal.  Neck: Neck supple.  Cardiovascular: Normal rate.   Pulmonary/Chest: Effort normal.  Abdominal: Soft. She exhibits no distension. There is no tenderness.  Musculoskeletal: Normal range of motion.  Lymphadenopathy:    She has no cervical adenopathy.  Neurological: She is alert and oriented to person, place, and time.  Skin: Skin is warm and dry.  Psychiatric: She has a normal mood and affect. Her behavior is normal.  Nursing note and vitals reviewed.  ED Course  Procedures (including critical care time) DIAGNOSTIC STUDIES: Oxygen Saturation is 99% on RA, normal by my interpretation.  COORDINATION OF CARE: 9:23 AM-Discussed treatment plan which includes follow-up with dentist with pt at bedside and pt agreed to plan.   Labs Review Labs Reviewed - No data to display  Imaging Review No results found.   EKG Interpretation None      MDM   Final diagnoses:  Pain, dental    No signs of ludwigs angina. Patient will have antibiotics, pain medication, and dental referral.   I personally performed the services described in this documentation, which was scribed in my presence. The  recorded information has been reviewed and is accurate.    Emilia BeckKaitlyn Wilmer Berryhill, PA-C 12/18/14 2047  Toy BakerAnthony T Allen, MD 12/21/14 [phone]

## 2014-12-16 NOTE — ED Notes (Signed)
Pt presents to department for L lower dental pain. Ongoing intermittently for several years. 10/10 pain upon arrival to ED. NAD.

## 2014-12-16 NOTE — ED Notes (Signed)
PT STATES SHE IS DRIVING. VICODIN NOT GIVEN.

## 2014-12-16 NOTE — Discharge Instructions (Signed)
Take Veetid as directed until gone. Take Vicodin as needed for pain. Follow up with the recommended dentist. Return to the ED with worsening or concerning symptoms.

## 2015-03-06 ENCOUNTER — Ambulatory Visit (INDEPENDENT_AMBULATORY_CARE_PROVIDER_SITE_OTHER): Payer: Medicaid Other | Admitting: Family Medicine

## 2015-03-06 ENCOUNTER — Encounter: Payer: Self-pay | Admitting: Family Medicine

## 2015-03-06 VITALS — BP 138/72 | HR 98 | Temp 98.5°F | Resp 16 | Ht 68.0 in | Wt 287.0 lb

## 2015-03-06 DIAGNOSIS — F419 Anxiety disorder, unspecified: Secondary | ICD-10-CM | POA: Diagnosis not present

## 2015-03-06 DIAGNOSIS — F329 Major depressive disorder, single episode, unspecified: Secondary | ICD-10-CM

## 2015-03-06 DIAGNOSIS — F32A Depression, unspecified: Secondary | ICD-10-CM

## 2015-03-06 MED ORDER — ALPRAZOLAM 0.5 MG PO TABS: 0.5000 mg | ORAL_TABLET | Freq: Every evening | ORAL | Status: DC | PRN

## 2015-03-06 NOTE — Progress Notes (Signed)
Subjective:    Patient ID: [name], female    DOB: 08-Mar-1981, 34 y.o.   MRN: [id number]  HPI 1/16 Patient has had panic attacks recently and increasing stress and anxiety. She has lost several pounds due to lack of appetite. Coincidentally she discontinued her Cymbalta abruptly at Christmas time. She just recently started back on the medication. On the medication she was doing well. Unfortunately she has had conjunctivitis in her left eye she went to emergency room and was given erythromycin ointment which has not helped. Also seen exam today there are no dendrites. However the erythema is pronounced and is now spreading to the skin surrounding the eye. She has swelling in the upper and lower eyelids as well as the left cheek.  At that time, my plan was: Patient's physical exam is normal. I will treat her conjunctivitis with a combination of TobraDex drops 3 times a day as well as Bactrim to cover the preseptal cellulitis. Recheck next week if no better or sooner if worse. Pap smear was sent to pathology. The remainder of her blood work is normal. I recommended diet exercise and weight loss. Also recommended she resume her Cymbalta to be consistent in taking it because a believe her anxiety is due to withdrawal.   03/06/15 Patient states she feels better on the Cymbalta. However by 5:00 in the afternoon she starts experiencing withdrawal from the medication. She begins to feel anxious and frustrated. The anxiety keeps her from sleeping. However the patient also admits that she has been smoking marijuana every night for the last several months. It seems she smokes marijuana her symptoms go away. Her temper improves. The mood swings improve. Past Medical History  Diagnosis Date  . Anxiety   . Irritable bowel syndrome (IBS) .  . Ectopic pregnancy   . Asthma   . Depression    Past Surgical History  Procedure Laterality Date  . Tubal ligation     Current Outpatient Prescriptions on File Prior  to Visit  Medication Sig Dispense Refill  . DULoxetine (CYMBALTA) 60 MG capsule Take 1 capsule (60 mg total) by mouth daily. 30 capsule 3   No current facility-administered medications on file prior to visit.   Allergies  Allergen Reactions  . Latex Rash   History   Social History  . Marital Status: Single    Spouse Name: N/A  . Number of Children: N/A  . Years of Education: N/A   Occupational History  . Not on file.   Social History Main Topics  . Smoking status: Current Every Day Smoker -- 0.50 packs/day    Types: Cigarettes  . Smokeless tobacco: Never Used  . Alcohol Use: Yes     Comment: social  . Drug Use: Yes    Special: Marijuana  . Sexual Activity: Yes    Birth Control/ Protection: None   Other Topics Concern  . Not on file   Social History Narrative   No family history on file.    Review of Systems  All other systems reviewed and are negative.      Objective:   Physical Exam  Constitutional: She is oriented to person, place, and time. She appears well-developed and well-nourished. No distress.  HENT:  Head: Normocephalic and atraumatic.  Right Ear: External ear normal.  Left Ear: External ear normal.  Nose: Nose normal.  Mouth/Throat: Oropharynx is clear and moist. No oropharyngeal exudate.  Eyes: EOM are normal. Pupils are equal, round, and reactive to light.  Neck: Normal range of motion. Neck supple. No JVD present. No thyromegaly present.  Cardiovascular: Normal rate, regular rhythm and normal heart sounds.  Exam reveals no gallop and no friction rub.   No murmur heard. Pulmonary/Chest: Effort normal and breath sounds normal. No respiratory distress. She has no wheezes. She has no rales. She exhibits no tenderness.  Abdominal: Soft. Bowel sounds are normal. She exhibits no distension and no mass. There is no tenderness. There is no rebound and no guarding.  Musculoskeletal: Normal range of motion. She exhibits no edema or tenderness.    Lymphadenopathy:    She has no cervical adenopathy.  Neurological: She is alert and oriented to person, place, and time. She has normal reflexes. No cranial nerve deficit. She exhibits normal muscle tone. Coordination normal.  Skin: Skin is warm. No rash noted. She is not diaphoretic. No erythema. No pallor.  Psychiatric: She has a normal mood and affect. Her behavior is normal. Judgment and thought content normal.  Vitals reviewed.         Assessment & Plan:  Anxiety - Plan: ALPRAZolam (XANAX) 0.5 MG tablet  Depression  Patient's depression has improved on Cymbalta. However I don't believe she is withdrawing from the medication later in the afternoon. Instead I believe she is withdrawing from her marijuana. I explained this to the patient. I would like her to discontinue smoking marijuana. She can use Xanax 0.5 mg by mouth daily at bedtime to help with anxiety and insomnia as she withdraws from the marijuana. After the first 2 weeks. I would like her to try to transition off the Xanax and discontinue the medication altogether.

## 2015-05-05 ENCOUNTER — Encounter: Payer: Self-pay | Admitting: Family Medicine

## 2015-11-14 ENCOUNTER — Emergency Department (HOSPITAL_COMMUNITY)
Admission: EM | Admit: 2015-11-14 | Discharge: 2015-11-15 | Disposition: A | Discharge status: Other Acute Inpatient Hospital | Payer: Medicaid Other | Attending: Emergency Medicine | Admitting: Emergency Medicine

## 2015-11-14 ENCOUNTER — Emergency Department (HOSPITAL_COMMUNITY): Payer: Medicaid Other

## 2015-11-14 ENCOUNTER — Encounter (HOSPITAL_COMMUNITY): Payer: Self-pay | Admitting: Emergency Medicine

## 2015-11-14 DIAGNOSIS — F17210 Nicotine dependence, cigarettes, uncomplicated: Secondary | ICD-10-CM | POA: Diagnosis not present

## 2015-11-14 DIAGNOSIS — Z79899 Other long term (current) drug therapy: Secondary | ICD-10-CM | POA: Insufficient documentation

## 2015-11-14 DIAGNOSIS — Z91040 Latex allergy status: Secondary | ICD-10-CM | POA: Diagnosis not present

## 2015-11-14 DIAGNOSIS — F419 Anxiety disorder, unspecified: Secondary | ICD-10-CM | POA: Diagnosis not present

## 2015-11-14 DIAGNOSIS — F1210 Cannabis abuse, uncomplicated: Secondary | ICD-10-CM | POA: Insufficient documentation

## 2015-11-14 DIAGNOSIS — F329 Major depressive disorder, single episode, unspecified: Secondary | ICD-10-CM | POA: Insufficient documentation

## 2015-11-14 DIAGNOSIS — M545 Low back pain: Secondary | ICD-10-CM | POA: Insufficient documentation

## 2015-11-14 DIAGNOSIS — Z3202 Encounter for pregnancy test, result negative: Secondary | ICD-10-CM | POA: Insufficient documentation

## 2015-11-14 DIAGNOSIS — R0789 Other chest pain: Secondary | ICD-10-CM | POA: Diagnosis not present

## 2015-11-14 DIAGNOSIS — R45851 Suicidal ideations: Secondary | ICD-10-CM

## 2015-11-14 DIAGNOSIS — R079 Chest pain, unspecified: Secondary | ICD-10-CM | POA: Diagnosis present

## 2015-11-14 DIAGNOSIS — J45909 Unspecified asthma, uncomplicated: Secondary | ICD-10-CM | POA: Diagnosis not present

## 2015-11-14 LAB — RAPID URINE DRUG SCREEN, HOSP PERFORMED
Amphetamines: NOT DETECTED
Barbiturates: NOT DETECTED
Benzodiazepines: NOT DETECTED
COCAINE: NOT DETECTED
OPIATES: NOT DETECTED
Tetrahydrocannabinol: POSITIVE — AB

## 2015-11-14 LAB — I-STAT TROPONIN, ED: TROPONIN I, POC: 0 ng/mL (ref 0.00–0.08)

## 2015-11-14 LAB — CBC
HCT: 43.5 % (ref 36.0–46.0)
HEMOGLOBIN: 13.9 g/dL (ref 12.0–15.0)
MCH: 27.7 pg (ref 26.0–34.0)
MCHC: 32 g/dL (ref 30.0–36.0)
MCV: 86.8 fL (ref 78.0–100.0)
PLATELETS: 186 10*3/uL (ref 150–400)
RBC: 5.01 MIL/uL (ref 3.87–5.11)
RDW: 12.7 % (ref 11.5–15.5)
WBC: 10.7 10*3/uL — AB (ref 4.0–10.5)

## 2015-11-14 LAB — BASIC METABOLIC PANEL
ANION GAP: 7 (ref 5–15)
BUN: 7 mg/dL (ref 6–20)
CALCIUM: 9.3 mg/dL (ref 8.9–10.3)
CHLORIDE: 108 mmol/L (ref 101–111)
CO2: 26 mmol/L (ref 22–32)
CREATININE: 0.71 mg/dL (ref 0.44–1.00)
GFR calc non Af Amer: >60 mL/min (ref >60)
Glucose, Bld: 110 mg/dL — ABNORMAL HIGH (ref 65–99)
Potassium: 3.8 mmol/L (ref 3.5–5.1)
SODIUM: 141 mmol/L (ref 135–145)

## 2015-11-14 LAB — POC URINE PREG, ED: Preg Test, Ur: NEGATIVE

## 2015-11-14 MED ORDER — ZOLPIDEM TARTRATE 5 MG PO TABS: 5.0000 mg | ORAL_TABLET | Freq: Every evening | ORAL | Status: DC | PRN

## 2015-11-14 MED ORDER — IBUPROFEN 200 MG PO TABS
600.0000 mg | ORAL_TABLET | Freq: Three times a day (TID) | ORAL | Status: DC | PRN
  Administered 2015-11-14 – 2015-11-15 (×2): 600 mg via ORAL
  Filled 2015-11-14 (×2): qty 1

## 2015-11-14 MED ORDER — DULOXETINE HCL 60 MG PO CPEP
60.0000 mg | ORAL_CAPSULE | Freq: Every day | ORAL | Status: DC
  Administered 2015-11-15: 60 mg via ORAL
  Filled 2015-11-14: qty 1

## 2015-11-14 MED ORDER — ALUM & MAG HYDROXIDE-SIMETH 200-200-20 MG/5ML PO SUSP: 30.0000 mL | ORAL | Status: DC | PRN

## 2015-11-14 MED ORDER — ALPRAZOLAM 0.5 MG PO TABS
0.5000 mg | ORAL_TABLET | Freq: Every evening | ORAL | Status: DC | PRN
Start: 2015-11-14 — End: 2015-11-15
  Administered 2015-11-14: 0.5 mg via ORAL
  Filled 2015-11-14: qty 1

## 2015-11-14 MED ORDER — ACETAMINOPHEN 325 MG PO TABS: 650.0000 mg | ORAL_TABLET | ORAL | Status: DC | PRN

## 2015-11-14 MED ORDER — ONDANSETRON HCL 4 MG PO TABS: 4.0000 mg | ORAL_TABLET | Freq: Three times a day (TID) | ORAL | Status: DC | PRN

## 2015-11-14 MED ORDER — LORAZEPAM 1 MG PO TABS: 1.0000 mg | ORAL_TABLET | Freq: Three times a day (TID) | ORAL | Status: DC | PRN

## 2015-11-14 NOTE — ED Notes (Signed)
Pt. Stated, I've had back pain for 2 weeks and 2 days ago I developed chest pain with no other symptoms. I gave plasma 2 weeks ago and said I couldn't give plasma anymore cause I have a antibody .
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
## 2015-11-14 NOTE — ED Provider Notes (Signed)
CSN: [id number]     Arrival date & time 11/14/15  1320 History   First MD Initiated Contact with Patient 11/14/15 1447     Chief Complaint  Patient presents with  . Back Pain  . Chest Pain     (Consider location/radiation/quality/duration/timing/severity/associated sxs/prior Treatment) Patient is a 34 y.o. female presenting with chest pain. The history is provided by the patient.  Chest Pain Pain location:  R lateral chest Pain quality: sharp and shooting   Pain radiates to:  Does not radiate Pain radiates to the back: no   Pain severity:  Moderate Onset quality:  Gradual Duration:  2 weeks Timing:  Constant Progression:  Unchanged Chronicity:  New Relieved by:  Nothing Worsened by:  Movement, deep breathing and certain positions Ineffective treatments:  None tried Associated symptoms: no dizziness, no fever, no headache, no nausea, no palpitations, no shortness of breath and not vomiting   Risk factors: no coronary artery disease, no diabetes mellitus and no hypertension     34 yo F with a chief complaint of right-sided chest wall pain. This been going on for the past couple weeks. Patient states is worse with movement palpation coughing. Patient denies any shortness of breath diaphoresis. Patient denies any family history of coronary disease. The patient during triage screening asked if she felt safe at home and patient started crying stating that she is been suicidal off and on for the past month. Currently has no plans to do so take Cymbalta but takes it irregularly. States that she used to have a mental health provider but they have recently dropped her from their care since she has been missing all of her appointments. Patient has been using marijuana but denies other drug use.  Past Medical History  Diagnosis Date  . Anxiety   . Irritable bowel syndrome (IBS) .  . Ectopic pregnancy   . Asthma   . Depression    Past Surgical History  Procedure Laterality Date  . Tubal  ligation     No family history on file. Social History  Substance Use Topics  . Smoking status: Current Every Day Smoker -- 0.50 packs/day    Types: Cigarettes  . Smokeless tobacco: Never Used  . Alcohol Use: Yes     Comment: social   OB History    No data available     Review of Systems  Constitutional: Negative for fever and chills.  HENT: Negative for congestion and rhinorrhea.   Eyes: Negative for redness and visual disturbance.  Respiratory: Negative for shortness of breath and wheezing.   Cardiovascular: Positive for chest pain. Negative for palpitations.  Gastrointestinal: Negative for nausea and vomiting.  Genitourinary: Negative for dysuria and urgency.  Musculoskeletal: Negative for myalgias and arthralgias.  Skin: Negative for pallor and wound.  Neurological: Negative for dizziness and headaches.      Allergies  Latex  Home Medications   Prior to Admission medications   Medication Sig Start Date End Date Taking? Authorizing Provider  DULoxetine (CYMBALTA) 60 MG capsule Take 1 capsule (60 mg total) by mouth daily. 04/01/14  Yes Donita BrooksWarren T Pickard, MD  ALPRAZolam Prudy Feeler(XANAX) 0.5 MG tablet Take 1 tablet (0.5 mg total) by mouth at bedtime as needed for anxiety. 03/06/15   Donita BrooksWarren T Pickard, MD   BP 129/85 mmHg  Pulse 95  Temp(Src) 99.4 F (37.4 C)  Resp 20  Ht 5\' 8"  (1.727 m)  Wt 274 lb (124.286 kg)  BMI 41.67 kg/m2  SpO2 97%  LMP  11/06/2015 Physical Exam  Constitutional: She is oriented to person, place, and time. She appears well-developed and well-nourished. No distress.  HENT:  Head: Normocephalic and atraumatic.  Eyes: EOM are normal. Pupils are equal, round, and reactive to light.  Neck: Normal range of motion. Neck supple.  Cardiovascular: Normal rate and regular rhythm.  Exam reveals no gallop and no friction rub.   No murmur heard. Pulmonary/Chest: Effort normal. She has no wheezes. She has no rales. She exhibits tenderness (TTP about the right lateral  chest wall).  Abdominal: Soft. She exhibits no distension. There is no tenderness.  Musculoskeletal: She exhibits no edema or tenderness.  Neurological: She is alert and oriented to person, place, and time.  Skin: Skin is warm and dry. She is not diaphoretic.  Psychiatric: She has a normal mood and affect. Her behavior is normal.  Nursing note and vitals reviewed.   ED Course  Procedures (including critical care time) Labs Review Labs Reviewed  BASIC METABOLIC PANEL - Abnormal; Notable for the following:    Glucose, Bld 110 (*)    All other components within normal limits  CBC - Abnormal; Notable for the following:    WBC 10.7 (*)    All other components within normal limits  I-STAT TROPOININ, ED    Imaging Review Dg Chest 2 View  11/14/2015  CLINICAL DATA:  Right-sided chest pain for 2 days EXAM: CHEST - 2 VIEW COMPARISON:  08/14/2013 FINDINGS: The heart size and mediastinal contours are within normal limits. Both lungs are clear. The visualized skeletal structures are unremarkable. IMPRESSION: No active disease. Electronically Signed   By: Alcide Clever M.D.   On: 11/14/2015 14:43   I have personally reviewed and evaluated these images and lab results as part of my medical decision-making.   EKG Interpretation   Date/Time:  Saturday November 14 2015 13:37:39 EST Ventricular Rate:  98 PR Interval:  140 QRS Duration: 84 QT Interval:  344 QTC Calculation: 439 R Axis:   68 Text Interpretation:  Normal sinus rhythm Normal ECG No significant change  since last tracing Confirmed by Chesky Heyer MD, Reuel Boom ([id number]) on 11/14/2015  3:59:58 PM      MDM   Final diagnoses:  Suicidal ideation  Chest wall pain    34 yo F with a chief complaint of suicidal ideation. This has been off and on for this patient for the past month. Has many stressors at home. Chest pain most of skeletal in nature, unremarkable EKG and chest x-ray feel that she has been ruled out for serious medical pathology.  She is medically cleared and available for mental health evaluation.  The patients results and plan were reviewed and discussed.   Any x-rays performed were independently reviewed by myself.   Differential diagnosis were considered with the presenting HPI.  Medications  acetaminophen (TYLENOL) tablet 650 mg (not administered)  ibuprofen (ADVIL,MOTRIN) tablet 600 mg (600 mg Oral Given 11/15/15 0723)  zolpidem (AMBIEN) tablet 5 mg (not administered)  ondansetron (ZOFRAN) tablet 4 mg (not administered)  alum & mag hydroxide-simeth (MAALOX/MYLANTA) 200-200-20 MG/5ML suspension 30 mL (not administered)  ALPRAZolam (XANAX) tablet 0.5 mg (0.5 mg Oral Given 11/14/15 2048)  DULoxetine (CYMBALTA) DR capsule 60 mg (not administered)    Filed Vitals:   11/14/15 1339 11/15/15 0110 11/15/15 0700 11/15/15 0711  BP: 129/85 109/73 105/58 105/58  Pulse: 95 78 74 74  Temp: 99.4 F (37.4 C) 97.7 F (36.5 C) 97.9 F (36.6 C) 97.9 F (36.6 C)  TempSrc:  Oral Oral Oral  Resp: Height:  (1.727 m)     Weight: 274 lb (124.286 kg)     SpO2: 97% 97%  98%    Final diagnoses:  Suicidal ideation  Chest wall pain    Admission/ observation were discussed with the admitting physician, patient and/or family and they are comfortable with the plan.    Melene Plan, DO 11/15/15 [phone]

## 2015-11-14 NOTE — ED Notes (Signed)
Pt. Given scrubs to put on, security called and staffing office. Pt. Crying in triage and expressing hopelessness

## 2015-11-14 NOTE — ED Notes (Signed)
Pt asking for po food - advised pt of snack times as discussed earlier. Pt then asking for ice cream - advised of snack times and no ice cream available. Voiced understanding.

## 2015-11-14 NOTE — ED Notes (Signed)
TSS at bedside 

## 2015-11-14 NOTE — ED Notes (Signed)
Pt given Xanax and Ibuprofen as requested.

## 2015-11-15 ENCOUNTER — Inpatient Hospital Stay (HOSPITAL_COMMUNITY)
Admission: AD | Admit: 2015-11-15 | Discharge: 2015-11-19 | DRG: 885 | Disposition: A | Discharge status: Home / Self Care | Payer: Medicaid Other | Source: Intra-hospital | Attending: Psychiatry | Admitting: Psychiatry

## 2015-11-15 ENCOUNTER — Encounter (HOSPITAL_COMMUNITY): Payer: Self-pay | Admitting: *Deleted

## 2015-11-15 DIAGNOSIS — F332 Major depressive disorder, recurrent severe without psychotic features: Principal | ICD-10-CM | POA: Diagnosis present

## 2015-11-15 DIAGNOSIS — F17210 Nicotine dependence, cigarettes, uncomplicated: Secondary | ICD-10-CM | POA: Diagnosis present

## 2015-11-15 DIAGNOSIS — F1210 Cannabis abuse, uncomplicated: Secondary | ICD-10-CM | POA: Insufficient documentation

## 2015-11-15 DIAGNOSIS — F339 Major depressive disorder, recurrent, unspecified: Secondary | ICD-10-CM | POA: Diagnosis present

## 2015-11-15 DIAGNOSIS — R45851 Suicidal ideations: Secondary | ICD-10-CM | POA: Diagnosis not present

## 2015-11-15 DIAGNOSIS — G4700 Insomnia, unspecified: Secondary | ICD-10-CM | POA: Diagnosis present

## 2015-11-15 DIAGNOSIS — F331 Major depressive disorder, recurrent, moderate: Secondary | ICD-10-CM | POA: Diagnosis not present

## 2015-11-15 DIAGNOSIS — R0789 Other chest pain: Secondary | ICD-10-CM | POA: Diagnosis not present

## 2015-11-15 MED ORDER — DULOXETINE HCL 60 MG PO CPEP
90.0000 mg | ORAL_CAPSULE | Freq: Every day | ORAL | Status: DC
  Administered 2015-11-15 – 2015-11-19 (×5): 90 mg via ORAL
  Filled 2015-11-15 (×2): qty 1
  Filled 2015-11-15 (×2): qty 3
  Filled 2015-11-15: qty 1
  Filled 2015-11-15: qty 3
  Filled 2015-11-15: qty 1
  Filled 2015-11-15: qty 3
  Filled 2015-11-15: qty 1

## 2015-11-15 MED ORDER — MAGNESIUM HYDROXIDE 400 MG/5ML PO SUSP: 30.0000 mL | Freq: Every day | ORAL | Status: DC | PRN

## 2015-11-15 MED ORDER — DULOXETINE HCL 60 MG PO CPEP
60.0000 mg | ORAL_CAPSULE | Freq: Every day | ORAL | Status: DC
  Filled 2015-11-15 (×2): qty 1

## 2015-11-15 MED ORDER — TRAZODONE HCL 50 MG PO TABS
50.0000 mg | ORAL_TABLET | Freq: Every evening | ORAL | Status: DC | PRN
  Administered 2015-11-15 – 2015-11-16 (×3): 50 mg via ORAL
  Filled 2015-11-15 (×3): qty 1

## 2015-11-15 MED ORDER — NICOTINE 21 MG/24HR TD PT24
21.0000 mg | MEDICATED_PATCH | Freq: Every day | TRANSDERMAL | Status: DC
  Administered 2015-11-15 – 2015-11-19 (×4): 21 mg via TRANSDERMAL
  Filled 2015-11-15 (×8): qty 1

## 2015-11-15 MED ORDER — ALUM & MAG HYDROXIDE-SIMETH 200-200-20 MG/5ML PO SUSP: 30.0000 mL | ORAL | Status: DC | PRN

## 2015-11-15 MED ORDER — ACETAMINOPHEN 325 MG PO TABS
650.0000 mg | ORAL_TABLET | Freq: Four times a day (QID) | ORAL | Status: DC | PRN
  Administered 2015-11-16 (×3): 650 mg via ORAL
  Filled 2015-11-15 (×3): qty 2

## 2015-11-15 NOTE — Plan of Care (Signed)
Problem: Diagnosis: Increased Risk For Suicide Attempt Goal: STG-Patient Will Comply With Medication Regime Outcome: Progressing Pt compliant with medications     

## 2015-11-15 NOTE — ED Notes (Signed)
Shower supplies given
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
## 2015-11-15 NOTE — Progress Notes (Signed)
Patient ID: [name], female   DOB: 1980/12/26, 34 y.o.   MRN: [id number] Seen by the dr and she has new orders. Order to increase Cymbalta to 90mg . She has been taking 60mg  and had it this am in the ED. She complained of feeling like the medication pooped out by the middle of the day. Gave her 30mg  to total 90mg  for the day. Med ed given to her and explained tomorrow she would get the whole 90mg  in the am.

## 2015-11-15 NOTE — ED Notes (Signed)
Per Patty, [id number] Dr Jama Flavorsobos - may send now.

## 2015-11-15 NOTE — Progress Notes (Signed)
D: Patient alert and cooperative on approach. Pt mood and affect appeared depressed and flat. Pt denies SI/HI/AVH and pain. Pt attended and participated in evening wrap up group. Cooperative with assessment.   A: Met with pt 1:1. Medications administered as prescribed. Support and encouragement provided to attend groups and engage in milieu. Pt encouraged to discuss feelings and come to staff with any question or concerns.   R: Patient remains safe and complaint with medications.

## 2015-11-15 NOTE — ED Notes (Signed)
Pt's friend, Nena PolioLisa Moore, called - per pt's request, advised her of visitation time and asked her to contact her daughter. Misty StanleyLisa advised she will come visit at 1230. Pt aware.

## 2015-11-15 NOTE — Progress Notes (Signed)
Adult Psychoeducational Group Note  Date:  11/15/2015 Time:  9:49 PM  Group Topic/Focus:  Wrap-Up Group:   The focus of this group is to help patients review their daily goal of treatment and discuss progress on daily workbooks.  Participation Level:  Active  Participation Quality:  Appropriate and Attentive  Affect:  Appropriate  Cognitive:  Appropriate  Insight: Appropriate and Good  Engagement in Group:  Engaged  Modes of Intervention:  Education  Additional Comments:  Pt had an ok day. Pt talked with kids dad and her goal is become better mentally for the sake of her kids.   Merlinda FrederickKeshia S Lakechia Nay 11/15/2015, 9:49 PM
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
## 2015-11-15 NOTE — [facility] Group Notes (Signed)
[facility] Group Notes: (Clinical Social Work)   11/15/2015 1:15-2:15PM  Summary of Progress/Problems: The main focus of today's process group was to  1) Define healthy supports versus unhealthy supports 2) Identify the areas of life in which we give and receive support;   3) Discuss the importance of adding more supports; and 4) Identify supports that could be added to help with each of those areas.  An emphasis was placed on using counselor, doctor, therapy groups, 12-step groups, and problem-specific support groups to expand supports.   The patient expressed full comprehension of the concepts presented, and agreed that there is a need to add more supports. She mostly listened to the discussion, but did have additions to make at times.  She left the group for awhile, but did return.  Type of Therapy: Process Group with Motivational Interviewing  Participation Level: Active  Participation Quality: Attentive, Sharing  Affect: Blunted and Depressed  Cognitive: Appropriate  Insight: Improving  Engagement in Therapy: Engaged  Modes of Intervention: Education, Support and Processing, Activity  Ambrose MantleMareida Grossman-Orr, LCSW 11/15/2015 3:15 PM

## 2015-11-15 NOTE — ED Notes (Signed)
Pt signed consent forms - [facility], copy to medical records.

## 2015-11-16 DIAGNOSIS — F332 Major depressive disorder, recurrent severe without psychotic features: Secondary | ICD-10-CM | POA: Diagnosis present

## 2015-11-16 MED ORDER — HYDROXYZINE HCL 25 MG PO TABS
25.0000 mg | ORAL_TABLET | Freq: Four times a day (QID) | ORAL | Status: DC | PRN
  Administered 2015-11-16 – 2015-11-19 (×4): 25 mg via ORAL
  Filled 2015-11-16 (×5): qty 1

## 2015-11-16 MED ORDER — CYCLOBENZAPRINE HCL 5 MG PO TABS
5.0000 mg | ORAL_TABLET | Freq: Two times a day (BID) | ORAL | Status: AC
  Administered 2015-11-16 – 2015-11-18 (×6): 5 mg via ORAL
  Filled 2015-11-16 (×7): qty 1

## 2015-11-16 NOTE — Plan of Care (Signed)
Problem: Consults Goal: Suicide Risk Patient Education (See Patient Education module for education specifics)  Outcome: Progressing Nurse discussed Suicide thoughts/depression/coping skills with patient.

## 2015-11-16 NOTE — Tx Team (Signed)
Interdisciplinary Treatment Plan Update (Adult) Date: 11/16/2015    Time Reviewed: 9:30 AM  Progress in Treatment: Attending groups: Continuing to assess, patient new to milieu Participating in groups: Continuing to assess, patient new to milieu Taking medication as prescribed: Yes Tolerating medication: Yes Family/Significant other contact made: No, CSW assessing for appropriate contacts Patient understands diagnosis: Yes Discussing patient identified problems/goals with staff: Yes Medical problems stabilized or resolved: Yes Denies suicidal/homicidal ideation: Yes Issues/concerns per patient self-inventory: Yes Other:  New problem(s) identified: N/A  Discharge Plan or Barriers: CSW continuing to assess, patient new to milieu.  Reason for Continuation of Hospitalization:  Depression Anxiety Medication Stabilization   Comments: N/A  Estimated length of stay: 3-5 days   Patient is a 34 year old female admitted for depression and anxiety. Stressors include multiple losses including the death of her child 10 years ago and a recent break up. Patient will benefit from crisis stabilization, medication evaluation, group therapy, and psycho education in addition to case management for discharge planning. Patient and CSW reviewed pt's identified goals and treatment plan. Pt verbalized understanding and agreed to treatment plan.     Review of initial/current patient goals per problem list:  1. Goal(s): Patient will participate in aftercare plan   Met: Yes   Target date: 3-5 days post admission date   As evidenced by: Patient will participate within aftercare plan AEB aftercare provider and housing plan at discharge being identified.    12/19: Goal met. Patient will likely return home to follow up with outpatient services.     2. Goal (s): Patient will exhibit decreased depressive symptoms and suicidal ideations.   Met: No   Target date: 3-5 days post admission  date   As evidenced by: Patient will utilize self rating of depression at 3 or below and demonstrate decreased signs of depression or be deemed stable for discharge by MD.   12/19: Goal not met: Pt presents with flat affect and depressed mood.  Pt admitted with depression rating of 10.  Pt to show decreased sign of depression and a rating of 3 or less before d/c.      3. Goal(s): Patient will demonstrate decreased signs and symptoms of anxiety.   Met: No   Target date: 3-5 days post admission date   As evidenced by: Patient will utilize self rating of anxiety at 3 or below and demonstrated decreased signs of anxiety, or be deemed stable for discharge by MD    12/19: Goal not met: Pt presents with anxious mood and affect.  Pt admitted with anxiety rating of 10.  Pt to show decreased sign of anxiety and a rating of 3 or less before d/c.     4. Goal(s): Patient will demonstrate decreased signs of withdrawal due to substance abuse   Met: Yes   Target date: 3-5 days post admission date   As evidenced by: Patient will produce a CIWA/COWS score of 0, have stable vitals signs, and no symptoms of withdrawal    12/15: Goal met. No withdrawal symptoms reported at this time per medical chart.      Attendees: Patient:    Family:    Physician: Dr. Parke Poisson; Dr. Adele Schilder 11/16/2015 9:30 AM  Nursing: Janann August, [place]land Ormond, [address] [place], RN 11/16/2015 9:30 AM  Clinical Social Worker: Tilden Fossa, [place] 11/16/2015 9:30 AM  Other: Peri Maris, LCSW; [place] [facility], LCSW  11/16/2015 9:30 AM  Other: Norberto Sorenson, P4CC 11/16/2015 9:30 AM  Other: Lars Pinks,  Case Manager 11/16/2015 9:30 AM  Other: Agustina Caroli , NP 11/16/2015 9:30 AM  Other:    Other:           Scribe for Treatment Team:  Tilden Fossa, MSW, [place]

## 2015-11-16 NOTE — Progress Notes (Signed)
Patient stated she has rash on thighs, itchy.  Stated she has had rash in the past.  D:  Patient's self inventory sheet, patient has poor sleep, sleep medication is helpful.  Good appetite, normal energy level, good concentration.  Rated depression 5, anxiety 8.  Withdrawals, irritability.  Denied SI.  Physical problems, headaches, rash, yeast rash on thighs, history of rash on thighs.  Physical pain, right side of chest to abdomen.  No pain medication.  Goal is medication management for anxiety/depression.  Plans to talk to someone.  No discharge plans.  No problems anticipated after discharge. A:  Medications administered per MD orders.  Emotional support and encouragement given patient. R:  Denied SI and HI, contracts for safety.  Denied A/V hallucinations.  Safety maintained with 15 minute checks.

## 2015-11-16 NOTE — Progress Notes (Signed)
[facility]  11/16/2015 2:47 PM Valene BorsDana D Waddell  MRN:  [id number]  Subjective:  Patient reports "I just want to sleep I am not feeling well, I think I pulled a muscle "   Objective:Audrinna D Schappell is awake, alert and oriented X4 , found resting in her bedroom.  Denies suicidal or homicidal ideation. Denies auditory or visual hallucination and does not appear to be responding to internal stimuli. Patient reports she  interacts well with staff and others. Patient reports she is medication compliant without mediation side effects. Stats that she has been attending groups however today she is not feeling well and states that she pulled a muscle and is in pain. States her depression 5/10. Reports good appetite and is resting well.  Support, encouragement and reassurance was provided.   Principal Problem: MDD (major depressive disorder), recurrent episode, severe (HCC) Diagnosis:   Patient Active Problem List   Diagnosis Date Noted  . MDD (major depressive disorder), recurrent episode, severe (HCC) [F33.2] 11/16/2015  . Marijuana abuse [F12.10]    Total Time spent with patient: 30 minutes  Past Psychiatric History: Anxiety, Depression   Past Medical History:  Past Medical History  Diagnosis Date  . Anxiety   . Irritable bowel syndrome (IBS) .  . Ectopic pregnancy   . Asthma   . Depression     Past Surgical History  Procedure Laterality Date  . Tubal ligation     Family History: History reviewed. No pertinent family history. Family Psychiatric  History: Mother: PTSD/Anxiety  Social History:  History  Alcohol Use  . Yes    Comment: social     History  Drug Use  . Yes  . Special: Marijuana    Social History   Social History  . Marital Status: Single    Spouse Name: N/A  . Number of Children: N/A  . Years of Education: N/A   Social History Main Topics  . Smoking status: Current Every Day Smoker -- 0.50 packs/day    Types: Cigarettes  . Smokeless tobacco: Never Used  .  Alcohol Use: Yes     Comment: social  . Drug Use: Yes    Special: Marijuana  . Sexual Activity: Yes    Birth Control/ Protection: None   Other Topics Concern  . None   Social History Narrative   Additional Social History:    Pain Medications: denies abusign Prescriptions: denies abusing Over the Counter: denies abusing History of alcohol / drug use?: Yes Name of Substance 1: marijuana 1 - Amount (size/oz): 2 blunts a day  1 - Frequency: 2 x a day 1 - Duration: ongoing 1 - Last Use / Amount: 11/14/15                  Sleep: Fair  Appetite:  Fair  Current Medications: Current Facility-Administered Medications  Medication Dose Route Frequency Provider Last Rate Last Dose  . acetaminophen (TYLENOL) tablet 650 mg  650 mg Oral Q6H PRN Thresa RossNadeem Akhtar, MD   650 mg at 11/16/15 1424  . alum & mag hydroxide-simeth (MAALOX/MYLANTA) 200-200-20 MG/5ML suspension 30 mL  30 mL Oral Q4H PRN Thresa RossNadeem Akhtar, MD      . DULoxetine (CYMBALTA) DR capsule 90 mg  90 mg Oral Daily Thresa RossNadeem Akhtar, MD   90 mg at 11/16/15 0801  . hydrOXYzine (ATARAX/VISTARIL) tablet 25 mg  25 mg Oral Q6H PRN Oneta Rackanika N Adalbert Alberto, NP      . magnesium hydroxide (MILK OF MAGNESIA) suspension 30  mL  30 mL Oral Daily PRN Thresa Ross, MD      . nicotine (NICODERM CQ - dosed in mg/24 hours) patch 21 mg  21 mg Transdermal Daily Beau Fanny, FNP   21 mg at 11/15/15 1204  . traZODone (DESYREL) tablet 50 mg  50 mg Oral QHS PRN Worthy Flank, NP   50 mg at 11/15/15 2243    Lab Results:  Results for orders placed or performed during the hospital encounter of 11/14/15 (from the past 48 hour(s))  Urine rapid drug screen (hosp performed)     Status: Abnormal   Collection Time: 11/14/15  4:35 PM  Result Value Ref Range   Opiates NONE DETECTED NONE DETECTED   Cocaine NONE DETECTED NONE DETECTED   Benzodiazepines NONE DETECTED NONE DETECTED   Amphetamines NONE DETECTED NONE DETECTED   Tetrahydrocannabinol POSITIVE (A) NONE  DETECTED   Barbiturates NONE DETECTED NONE DETECTED    Comment:        DRUG SCREEN FOR MEDICAL PURPOSES ONLY.  IF CONFIRMATION IS NEEDED FOR ANY PURPOSE, NOTIFY LAB WITHIN 5 DAYS.        LOWEST DETECTABLE LIMITS FOR URINE DRUG SCREEN Drug Class       Cutoff (ng/mL) Amphetamine      1000 Barbiturate      200 Benzodiazepine   200 Tricyclics       300 Opiates          300 Cocaine          300 THC              50   POC Urine Pregnancy, ED (do NOT order at [facility])     Status: None   Collection Time: 11/14/15  4:57 PM  Result Value Ref Range   Preg Test, Ur NEGATIVE NEGATIVE    Comment:        THE SENSITIVITY OF THIS METHODOLOGY IS >24 mIU/mL     Physical Findings: AIMS: Facial and Oral Movements Muscles of Facial Expression: None, normal Lips and Perioral Area: None, normal Jaw: None, normal Tongue: None, normal,Extremity Movements Upper (arms, wrists, hands, fingers): None, normal Lower (legs, knees, ankles, toes): None, normal, Trunk Movements Neck, shoulders, hips: None, normal, Overall Severity Severity of abnormal movements (highest score from questions above): None, normal Incapacitation due to abnormal movements: None, normal Patient's awareness of abnormal movements (rate only patient's report): No Awareness, Dental Status Current problems with teeth and/or dentures?: No Does patient usually wear dentures?: No  CIWA:  CIWA-Ar Total: 1 COWS:  COWS Total Score: 2  Musculoskeletal: Strength & Muscle Tone: within normal limits Gait & Station: normal Patient leans: N/A  Psychiatric Specialty Exam: Review of Systems  Eyes: Negative.   Cardiovascular: Negative.   Gastrointestinal: Negative.   Psychiatric/Behavioral: Positive for depression. Negative for suicidal ideas. The patient is nervous/anxious and has insomnia.   All other systems reviewed and are negative.   Blood pressure 110/65, pulse 83, temperature 97.6 F (36.4 C), temperature source Oral, resp. rate  16, height  (1.727 m), weight 122.925 kg (271 lb), last menstrual period 11/06/2015.Body mass index is 41.22 kg/(m^2).  General Appearance: Casual  Eye Contact::  Fair  Speech:  Clear and Coherent  Volume:  Decreased  Mood:  Depressed  Affect:  Congruent, Depressed and Flat  Thought Process:  Coherent, Goal Directed and Logical  Orientation:  Full (Time, Place, and Person)  Thought Content:  Hallucinations: None  Suicidal Thoughts:  No  Homicidal Thoughts:  No  Memory:  Immediate;   Fair Recent;   Fair Remote;   Fair  Judgement:  Fair  Insight:  Fair  Psychomotor Activity:  Restlessness  Concentration:  Fair  Recall:  [employer] of Knowledge:Fair  Language: Fair  Akathisia:  No  Handed:  Right  AIMS (if indicated):     Assets:  Communication Skills Desire for Improvement Vocational/Educational  ADL's:  Intact  Cognition: WNL  Sleep:  Number of Hours: 6   Treatment Plan Summary: Daily contact with patient to assess and evaluate symptoms and progress in treatment and Medication management   GAD: continue xanax for short term for now Sleep: reviewed sleep hygiene.  Marijuana use: counseling for sobriety and long term plans for abstinence Continue with Cymbalta 90 mgs for mood stabilization. Depression and Anxiety Continue with Trazodone 100 mg for insomnia Start vistaril 25 mg PO Q6 PRN for Anxiety  Start Flexeril 5 mg PO BID for pulled muscle/spasms  Will continue to monitor vitals ,medication compliance and treatment side effects while patient is here.  Reviewed labs Glucose 110 elevated, BAL - 198, UDS+THC  CSW will start working on disposition.  Patient to participate in therapeutic milieu  Jerene Pitch LewisFNP- [facility] 11/16/2015, 2:47 PM

## 2015-11-16 NOTE — [facility] Group Notes (Signed)
[facility] LCSW Group Therapy 11/16/2015  1:15 pm  Type of Therapy: Group Therapy Participation Level: Active  Participation Quality: Attentive, Sharing and Supportive  Affect: Depressed and anxious  Cognitive: Alert and Oriented  Insight: Developing/Improving and Engaged  Engagement in Therapy: Developing/Improving and Engaged  Modes of Intervention: Clarification, Confrontation, Discussion, Education, Exploration,  Limit-setting, Orientation, Problem-solving, Rapport Building, Dance movement psychotherapisteality Testing, Socialization and Support  Summary of Progress/Problems: Pt identified obstacles faced currently and processed barriers involved in overcoming these obstacles. Pt identified steps necessary for overcoming these obstacles and explored motivation (internal and external) for facing these difficulties head on. Pt further identified one area of concern in their lives and chose a goal to focus on for today. Patient identified relationship issues, social media, medication non-compliance, and financial difficulties as obstacles. Patient reports that her goal is to become independent and find work or return to school. CSW and other group members provided patient with emotional support and encouragement.   Samuella BruinKristin Nile Prisk, MSW, [employer]LCSWA Clinical Social Worker [facility] [phone]

## 2015-11-16 NOTE — Progress Notes (Signed)
Nutrition Brief Note  Patient identified on the Malnutrition Screening Tool (MST) Report  Patient with 16 lb weight loss since April, insignificant for time frame.   Wt Readings from Last 15 Encounters:  11/15/15 271 lb (122.925 kg)  11/14/15 274 lb (124.286 kg)  03/06/15 287 lb (130.182 kg)  12/16/14 175 lb (79.379 kg)  12/04/14 277 lb (125.646 kg)  12/02/14 284 lb (128.822 kg)  11/11/14 285 lb (129.275 kg)  04/01/14 282 lb (127.914 kg)  09/11/13 293 lb (132.904 kg)  08/14/13 286 lb (129.729 kg)  03/04/13 286 lb (129.729 kg)    Body mass index is 41.22 kg/(m^2). Patient meets criteria for morbid obesity based on current BMI.   Diet Order: Diet regular Room service appropriate?: Yes; Fluid consistency:: Thin Pt is also offered choice of unit snacks mid-morning and mid-afternoon.  Pt is eating as desired.   Labs and medications reviewed.   No nutrition interventions warranted at this time. If nutrition issues arise, please consult RD.   Michele FrancoLindsey Vieva Brummitt, MS, RD, LDN Pager: [phone] After Hours Pager: [phone]
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
## 2015-11-16 NOTE — [facility] Group Notes (Signed)
[facility] LCSW Aftercare Discharge Planning Group Note  11/16/2015  8:45 AM  Participation Quality: Did Not Attend. Patient invited to participate but declined.  Ahmar Pickrell, MSW, LCSWA Clinical Social Worker [facility] [phone]   

## 2015-11-16 NOTE — Progress Notes (Signed)
Adult Psychoeducational Group Note  Date:  11/16/2015 Time:  9:52 PM  Group Topic/Focus:  Wrap-Up Group:   The focus of this group is to help patients review their daily goal of treatment and discuss progress on daily workbooks.  Participation Level:  Active  Participation Quality:  Appropriate and Attentive  Affect:  Appropriate  Cognitive:  Appropriate  Insight: Appropriate and Good  Engagement in Group:  Engaged  Modes of Intervention:  Education  Additional Comments:  Pt overall had a good day. Pt goal for tomorrow is to work on discharge plans and treatment plans. Pt do not want to discharge without having a plan and outside resources available.   Merlinda FrederickKeshia S Abril Cappiello 11/16/2015, 9:52 PM

## 2015-11-16 NOTE — [facility] Suicide Risk Assessment (Signed)
[facility] INPATIENT:  Family/Significant Other Suicide Prevention Education  Suicide Prevention Education:  Patient Refusal for Family/Significant Other Suicide Prevention Education: The patient [name] has refused to provide written consent for family/significant other to be provided Family/Significant Other Suicide Prevention Education during admission and/or prior to discharge.  Physician notified. SPE reviewed with patient and brochure provided. Patient encouraged to return to hospital if having suicidal thoughts, patient verbalized his/her understanding and has no further questions at this time.   Aleesa Sweigert, [place]boKristin L 11/16/2015, 12:50 PM

## 2015-11-17 MED ORDER — TRAZODONE HCL 150 MG PO TABS
150.0000 mg | ORAL_TABLET | Freq: Every day | ORAL | Status: DC
  Administered 2015-11-17 – 2015-11-18 (×2): 150 mg via ORAL
  Filled 2015-11-17 (×5): qty 1

## 2015-11-17 MED ORDER — FLUCONAZOLE 150 MG PO TABS
150.0000 mg | ORAL_TABLET | Freq: Once | ORAL | Status: AC
  Administered 2015-11-17: 150 mg via ORAL
  Filled 2015-11-17: qty 1

## 2015-11-17 NOTE — Progress Notes (Signed)
Patient ID: [name], female   DOB: [birth year], 34 y.o.   MR[facility] [id number] [facility]  11/17/2015 1:29 PM [name]  MRN:  [id number]  Subjective:  Patient reports "I'm doing good so far now that my Cymbalta was recently adjusted. I still sleep very poorly. I got this yeast rashes to my groin & skin fold areas. Normally, Diflucan takes care of whenever I have it".  Objective:  [name] is seen, chart reviewed. She is awake, alert & oriented X 4. She is visible on the unit, participating in group milieu. Denies any adverse effects from her medications. She appears to be in no apparent distress. Treated with Diflucan 1 tablet (150 mg) once for yeast infection on the skin.  Principal Problem: MDD (major depressive disorder), recurrent episode, severe (HCC) Diagnosis:   Patient Active Problem List   Diagnosis Date Noted  . MDD (major depressive disorder), recurrent episode, severe (HCC) [F33.2] 11/16/2015  . Marijuana abuse [F12.10]    Total Time spent with patient: 15 minutes  Past Psychiatric History: Anxiety, Depression   Past Medical History:  Past Medical History  Diagnosis Date  . Anxiety   . Irritable bowel syndrome (IBS) .  . Ectopic pregnancy   . Asthma   . Depression     Past Surgical History  Procedure Laterality Date  . Tubal ligation     Family History: History reviewed. No pertinent family history.  Family Psychiatric  History: Mother: PTSD/Anxiety   Social History:  History  Alcohol Use  . Yes    Comment: social     History  Drug Use  . Yes  . Special: Marijuana    Social History   Social History  . Marital Status: Single    Spouse Name: N/A  . Number of Children: N/A  . Years of Education: N/A   Social History Main Topics  . Smoking status: Current Every Day Smoker -- 0.50 packs/day    Types: Cigarettes  . Smokeless tobacco: Never Used  . Alcohol Use: Yes     Comment: social  . Drug Use: Yes    Special: Marijuana  . Sexual Activity:  Yes    Birth Control/ Protection: None   Other Topics Concern  . None   Social History Narrative   Additional Social History:    Pain Medications: denies abusign Prescriptions: denies abusing Over the Counter: denies abusing History of alcohol / drug use?: Yes Name of Substance 1: marijuana 1 - Amount (size/oz): 2 blunts a day  1 - Frequency: 2 x a day 1 - Duration: ongoing 1 - Last Use / Amount: 11/14/15  Sleep: Poor  Appetite:  Fair  Current Medications: Current Facility-Administered Medications  Medication Dose Route Frequency Provider Last Rate Last Dose  . acetaminophen (TYLENOL) tablet 650 mg  650 mg Oral Q6H PRN Thresa RossNadeem Akhtar, MD   650 mg at 11/16/15 2125  . alum & mag hydroxide-simeth (MAALOX/MYLANTA) 200-200-20 MG/5ML suspension 30 mL  30 mL Oral Q4H PRN Thresa RossNadeem Akhtar, MD      . cyclobenzaprine (FLEXERIL) tablet 5 mg  5 mg Oral BID Oneta Rackanika N Lewis, NP   5 mg at 11/17/15 0802  . DULoxetine (CYMBALTA) DR capsule 90 mg  90 mg Oral Daily Thresa RossNadeem Akhtar, MD   90 mg at 11/17/15 0802  . hydrOXYzine (ATARAX/VISTARIL) tablet 25 mg  25 mg Oral Q6H PRN Oneta Rackanika N Lewis, NP   25 mg at 11/16/15 1726  . magnesium hydroxide (MILK OF MAGNESIA)  suspension 30 mL  30 mL Oral Daily PRN Thresa Ross, MD      . nicotine (NICODERM CQ - dosed in mg/24 hours) patch 21 mg  21 mg Transdermal Daily Beau Fanny, FNP   21 mg at 11/17/15 0802  . traZODone (DESYREL) tablet 50 mg  50 mg Oral QHS PRN Worthy Flank, NP   50 mg at 11/16/15 2302   Lab Results:  No results found for this or any previous visit (from the past 48 hour(s)).  Physical Findings: AIMS: Facial and Oral Movements Muscles of Facial Expression: None, normal Lips and Perioral Area: None, normal Jaw: None, normal Tongue: None, normal,Extremity Movements Upper (arms, wrists, hands, fingers): None, normal Lower (legs, knees, ankles, toes): None, normal, Trunk Movements Neck, shoulders, hips: None, normal, Overall  Severity Severity of abnormal movements (highest score from questions above): None, normal Incapacitation due to abnormal movements: None, normal Patient's awareness of abnormal movements (rate only patient's report): No Awareness, Dental Status Current problems with teeth and/or dentures?: No Does patient usually wear dentures?: No  CIWA:  CIWA-Ar Total: 1 COWS:  COWS Total Score: 2  Musculoskeletal: Strength & Muscle Tone: within normal limits Gait & Station: normal Patient leans: N/A  Psychiatric Specialty Exam: Review of Systems  Eyes: Negative.   Cardiovascular: Negative.   Gastrointestinal: Negative.   Psychiatric/Behavioral: Positive for depression. Negative for suicidal ideas. The patient is nervous/anxious and has insomnia.   All other systems reviewed and are negative.   Blood pressure 103/62, pulse 93, temperature 97.8 F (36.6 C), temperature source Oral, resp. rate 14, height  (1.727 m), weight 122.925 kg (271 lb), last menstrual period 11/06/2015.Body mass index is 41.22 kg/(m^2).  General Appearance: Casual  Eye Contact::  Fair  Speech:  Clear and Coherent  Volume:  Decreased  Mood:  Depressed  Affect:  Congruent, Depressed and Flat  Thought Process:  Coherent, Goal Directed and Logical  Orientation:  Full (Time, Place, and Person)  Thought Content:  Hallucinations: None  Suicidal Thoughts:  No  Homicidal Thoughts:  No  Memory:  Immediate;   Fair Recent;   Fair Remote;   Fair  Judgement:  Fair  Insight:  Fair  Psychomotor Activity:  Normal  Concentration:  Fair  Recall:  [employer] of Knowledge:Fair  Language: Fair  Akathisia:  No  Handed:  Right  AIMS (if indicated):     Assets:  Communication Skills Desire for Improvement Vocational/Educational  ADL's:  Intact  Cognition: WNL  Sleep:  Number of Hours: 6.25   Treatment Plan Summary: Daily contact with patient to assess and evaluate symptoms and progress in treatment and Medication  management   GAD: continue xanax for short term for now Sleep: Increased Trazodone to 150 mg Q hs for insomnia.  Marijuana use: Counseling for sobriety and long term plans for abstinence. Continue with Cymbalta 90 mgs for mood stabilization. Depression and Anxiety Continue vistaril 25 mg PO Q6 PRN for Anxiety  Continue Flexeril 5 mg PO BID for pulled muscle/spasms  Will continue to monitor vitals ,medication compliance and treatment side effects while patient is here.  Reviewed labs, no changes noted. CSW will start working on disposition.  Patient to participate in therapeutic milieu. Diflucan 150 mg x 1 dose for yeast infection.  Armandina Stammer IFNP- [facility] 11/17/2015, 1:29 PM  Agree with treatment plan. Kathryne Sharper, MD

## 2015-11-17 NOTE — Progress Notes (Signed)
Adult Psychoeducational Group Note  Date:  11/17/2015 Time:  10:24 PM  Group Topic/Focus:  Wrap-Up Group:   The focus of this group is to help patients review their daily goal of treatment and discuss progress on daily workbooks.  Participation Level:  Active  Participation Quality:  Appropriate and Attentive  Affect:  Appropriate  Cognitive:  Appropriate  Insight: Appropriate and Good  Engagement in Group:  Engaged  Modes of Intervention:  Education  Additional Comments:  Pt had a good day. Pt goal for tomorrow is to continue to work on her discharge plans.   Merlinda FrederickKeshia S Dez Stauffer 11/17/2015, 10:24 PM

## 2015-11-17 NOTE — Progress Notes (Signed)
Recreation Therapy Notes  Animal-Assisted Activity (AAA) Program Checklist/Progress Notes Patient Eligibility Criteria Checklist & Daily Group note for Rec Tx Intervention  Date: 12.20.2016  Time: 2:45pm Location: [address] Dayroom   AAA/T Program Assumption of Risk Form signed by Patient/ or Parent Legal Guardian yes  Patient is free of allergies or sever asthma yes  Patient reports no fear of animals yes  Patient reports no history of cruelty to animals yes  Patient understands his/her participation is voluntary yes  Patient washes hands before animal contact yes  Patient washes hands after animal contact yes  Behavioral Response: Appropriate   Education: Hand Washing, Appropriate Animal Interaction   Education Outcome: Acknowledges education.   Clinical Observations/Feedback: Patient attended session and engaged appropriately with peers and therapy dog team.   Grettel Rames L Trennon Torbeck, LRT/CTRS       Sadrac Zeoli L 11/17/2015 3:57 PM 

## 2015-11-17 NOTE — Progress Notes (Signed)
Adult Psychoeducational Group Note  Date:  11/17/2015 Time:  0900  Group Topic/Focus:  Orientation:   The focus of this group is to educate the patient on the purpose and policies of crisis stabilization and provide a format to answer questions about their admission.  The group details unit policies and expectations of patients while admitted.  Participation Level:  Did Not Attend  Participation Quality:    Affect:    Cognitive:    Insight:   Engagement in Group:    Modes of Intervention:    Additional Comments:    Dewanna Hurston L 11/17/2015, 10:08 AM

## 2015-11-17 NOTE — [facility] Group Notes (Signed)
[facility] LCSW Group Therapy 11/17/2015 1:15 PM  Type of Therapy: Group Therapy- Feelings about Diagnosis  Participation Level: Active   Participation Quality:  Appropriate  Affect:  Appropriate  Cognitive: Alert and Oriented   Insight:  Developing   Engagement in Therapy: Developing/Improving and Engaged   Modes of Intervention: Clarification, Confrontation, Discussion, Education, Exploration, Limit-setting, Orientation, Problem-solving, Rapport Building, Dance movement psychotherapisteality Testing, Socialization and Support  Description of Group:   This group will allow patients to explore their thoughts and feelings about diagnoses they have received. Patients will be guided to explore their level of understanding and acceptance of these diagnoses. Facilitator will encourage patients to process their thoughts and feelings about the reactions of others to their diagnosis, and will guide patients in identifying ways to discuss their diagnosis with significant others in their lives. This group will be process-oriented, with patients participating in exploration of their own experiences as well as giving and receiving support and challenge from other group members.  Summary of Progress/Problems:  Pt participated actively in group discussion. She expressed that her current relationship is uncertain as they have had difficulty communicating. Pt identifies a tendency to "shut down" and be dismissive of problems that shout be addressed between them. Pt reports that she often has difficulty managing emotions. Pt expressed that she likes her direct personality.  Therapeutic Modalities:   Cognitive Behavioral Therapy Solution Focused Therapy Motivational Interviewing Relapse Prevention Therapy  Chad CordialLauren Carter, LCSWA 11/17/2015 3:22 PM

## 2015-11-17 NOTE — Plan of Care (Signed)
Problem: Diagnosis: Increased Risk For Suicide Attempt Goal: STG-Patient Will Report Suicidal Feelings to Staff Outcome: Progressing Michele HarmanDana denies thoughts of self harm or SI to writer this morning during shift assessment.

## 2015-11-17 NOTE — Progress Notes (Signed)
D   Pt is depressed and anxious   She reports having an ok day but has not been sleeping good   She is compliant with treatment and interacts well with others   She attends and participates in groups A   Verbal support given   Medications administered and effectiveness monitored   Q 15 min checks R   Pt safe at present

## 2015-11-17 NOTE — Progress Notes (Signed)
Patient ID: Michele BorsDana D Oconnor, female   DOB: 1981-10-01, 34 y.o.   MRN: [id number]  DAR: Pt. Denies SI/HI and A/V Hallucinations. She reports sleep is fair, appetite is good, energy level is normal, and concentration is good. Patient does report pain and received scheduled Flexeril. Support and encouragement provided to the patient. Scheduled medications administered to patient per physician's orders. Diflucan administered to patient for rash. Patient is receptive and cooperative. She is initially seen in bed but becomes more visible in the milieu as the day goes on. Patient seen interacting with peers appropriately. Q15 minute checks are maintained for safety.

## 2015-11-18 NOTE — Progress Notes (Signed)
Adult Psychoeducational Group Note  Date:  11/18/2015 Time:  9:30 PM  Group Topic/Focus:  Wrap-Up Group:   The focus of this group is to help patients review their daily goal of treatment and discuss progress on daily workbooks.  Participation Level:  Active  Participation Quality:  Appropriate  Affect:  Appropriate  Cognitive:  Alert  Insight: Appropriate  Engagement in Group:  Engaged  Modes of Intervention:  Discussion  Additional Comments:  Pt stated that she had a good day. She stated that there are a lot of issues going on at home right now and she is not looking forward to dealing with them.   Kaleen OdeaCOOKE, Gretel Cantu R 11/18/2015, 9:30 PM

## 2015-11-18 NOTE — Progress Notes (Signed)
Patient ID: [name], female   DOB: August 01, 1981, 34 y.o.   MRN: [id number]  DAR:  Patient seen in the hallway on the phone when writer assumed care. Patient does report pain and is continuing to receive scheduled medication for this. She reports sleep is fair, appetite is good, energy level is normal, and concentration is good. She rates depression and hopelessness 0/10, and anxiety 3/10. Support and encouragement provided to the patient. Scheduled medications administered to patient per physician's orders. Patient is receptive and cooperative. Q15 minute checks are maintained for safety.

## 2015-11-18 NOTE — [facility] Group Notes (Signed)
[facility] LCSW Group Therapy 11/18/2015 1:15 PM  Type of Therapy: Group Therapy- Emotion Regulation  Participation Level: Active   Participation Quality:  Appropriate  Affect: Appropriate  Cognitive: Alert and Oriented   Insight:  Developing/Improving  Engagement in Therapy: Developing/Improving and Engaged   Modes of Intervention: Clarification, Confrontation, Discussion, Education, Exploration, Limit-setting, Orientation, Problem-solving, Rapport Building, Dance movement psychotherapisteality Testing, Socialization and Support  Summary of Progress/Problems: The topic for group today was emotional regulation. This group focused on both positive and negative emotion identification and allowed group members to process ways to identify feelings, regulate negative emotions, and find healthy ways to manage internal/external emotions. Group members were asked to reflect on a time when their reaction to an emotion led to a negative outcome and explored how alternative responses using emotion regulation would have benefited them. Group members were also asked to discuss a time when emotion regulation was utilized when a negative emotion was experienced. Pt participated actively in group discussion and processed her difficulty controlling her anger and anxiety. Pt was able to offer suggestions on how to manage these emotions such as having a friend who can help put a situation in perspective and taking "time outs" in order to have a more clear thought process.    Michele CordialLauren Carter, LCSWA 11/18/2015 1:42 PM

## 2015-11-18 NOTE — Progress Notes (Signed)
[facility]  11/18/2015 10:28 AM [name]  MRN:  [id number]  Subjective:  Patient reports that she is doing all right on the Cymbalta.  She reports that she used the last of her money spending it on "weed."  She reports that she is still having itching to her yeast/groin fold.  Patient was started on Diflucan.   Objective:  [name] is seen, chart reviewed. She is awake, alert & oriented X 4. She is visible on the unit, participating in group milieu. Denies any adverse effects from her medications. She appears to be in no apparent distress. Treated with Diflucan 1 tablet (150 mg) once for yeast infection on the skin.           Principal Problem: MDD (major depressive disorder), recurrent episode, severe (HCC) Diagnosis:   Patient Active Problem List   Diagnosis Date Noted  . MDD (major depressive disorder), recurrent episode, severe (HCC) [F33.2] 11/16/2015  . Marijuana abuse [F12.10]    Total Time spent with patient: 15 minutes  Past Psychiatric History: Anxiety, Depression   Past Medical History:  Past Medical History  Diagnosis Date  . Anxiety   . Irritable bowel syndrome (IBS) .  . Ectopic pregnancy   . Asthma   . Depression     Past Surgical History  Procedure Laterality Date  . Tubal ligation     Family History: History reviewed. No pertinent family history.  Family Psychiatric  History: Mother: PTSD/Anxiety   Social History:  History  Alcohol Use  . Yes    Comment: social     History  Drug Use  . Yes  . Special: Marijuana    Social History   Social History  . Marital Status: Single    Spouse Name: N/A  . Number of Children: N/A  . Years of Education: N/A   Social History Main Topics  . Smoking status: Current Every Day Smoker -- 0.50 packs/day    Types: Cigarettes  . Smokeless tobacco: Never Used  . Alcohol Use: Yes     Comment: social  . Drug Use: Yes    Special: Marijuana  . Sexual Activity: Yes    Birth Control/ Protection: None    Other Topics Concern  . None   Social History Narrative   Additional Social History:    Pain Medications: denies abusign Prescriptions: denies abusing Over the Counter: denies abusing History of alcohol / drug use?: Yes Name of Substance 1: marijuana 1 - Amount (size/oz): 2 blunts a day  1 - Frequency: 2 x a day 1 - Duration: ongoing 1 - Last Use / Amount: 11/14/15  Sleep: Poor  Appetite:  Fair  Current Medications: Current Facility-Administered Medications  Medication Dose Route Frequency Provider Last Rate Last Dose  . acetaminophen (TYLENOL) tablet 650 mg  650 mg Oral Q6H PRN Thresa RossNadeem Akhtar, MD   650 mg at 11/16/15 2125  . alum & mag hydroxide-simeth (MAALOX/MYLANTA) 200-200-20 MG/5ML suspension 30 mL  30 mL Oral Q4H PRN Thresa RossNadeem Akhtar, MD      . cyclobenzaprine (FLEXERIL) tablet 5 mg  5 mg Oral BID Oneta Rackanika N Lewis, NP   5 mg at 11/18/15 0733  . DULoxetine (CYMBALTA) DR capsule 90 mg  90 mg Oral Daily Thresa RossNadeem Akhtar, MD   90 mg at 11/18/15 0732  . hydrOXYzine (ATARAX/VISTARIL) tablet 25 mg  25 mg Oral Q6H PRN Oneta Rackanika N Lewis, NP   25 mg at 11/16/15 1726  . magnesium hydroxide (MILK OF MAGNESIA) suspension  30 mL  30 mL Oral Daily PRN Thresa Ross, MD      . nicotine (NICODERM CQ - dosed in mg/24 hours) patch 21 mg  21 mg Transdermal Daily Beau Fanny, FNP   21 mg at 11/18/15 4098  . traZODone (DESYREL) tablet 150 mg  150 mg Oral QHS Sanjuana Kava, NP   150 mg at 11/17/15 2206   Lab Results:  No results found for this or any previous visit (from the past 48 hour(s)).  Physical Findings: AIMS: Facial and Oral Movements Muscles of Facial Expression: None, normal Lips and Perioral Area: None, normal Jaw: None, normal Tongue: None, normal,Extremity Movements Upper (arms, wrists, hands, fingers): None, normal Lower (legs, knees, ankles, toes): None, normal, Trunk Movements Neck, shoulders, hips: None, normal, Overall Severity Severity of abnormal movements (highest score  from questions above): None, normal Incapacitation due to abnormal movements: None, normal Patient's awareness of abnormal movements (rate only patient's report): No Awareness, Dental Status Current problems with teeth and/or dentures?: No Does patient usually wear dentures?: No  CIWA:  CIWA-Ar Total: 1 COWS:  COWS Total Score: 2  Musculoskeletal: Strength & Muscle Tone: within normal limits Gait & Station: normal Patient leans: N/A  Psychiatric Specialty Exam: Review of Systems  Eyes: Negative.   Cardiovascular: Negative.   Gastrointestinal: Negative.   Psychiatric/Behavioral: Positive for depression. Negative for suicidal ideas. The patient is nervous/anxious and has insomnia.   All other systems reviewed and are negative.   Blood pressure 119/74, pulse 109, temperature 97.8 F (36.6 C), temperature source Oral, resp. rate 16, height  (1.727 m), weight 122.925 kg (271 lb), last menstrual period 11/06/2015.Body mass index is 41.22 kg/(m^2).  General Appearance: Casual  Eye Contact::  Fair  Speech:  Clear and Coherent  Volume:  Decreased  Mood:  Depressed  Affect:  Congruent, Depressed and Flat  Thought Process:  Coherent, Goal Directed and Logical  Orientation:  Full (Time, Place, and Person)  Thought Content:  Hallucinations: None  Suicidal Thoughts:  No  Homicidal Thoughts:  No  Memory:  Immediate;   Fair Recent;   Fair Remote;   Fair  Judgement:  Fair  Insight:  Fair  Psychomotor Activity:  Normal  Concentration:  Fair  Recall:  [employer] of Knowledge:Fair  Language: Fair  Akathisia:  No  Handed:  Right  AIMS (if indicated):     Assets:  Communication Skills Desire for Improvement Vocational/Educational  ADL's:  Intact  Cognition: WNL  Sleep:  Number of Hours: 5.75   Treatment Plan Summary: Daily contact with patient to assess and evaluate symptoms and progress in treatment and Medication management   GAD: continue xanax for short term for  now Sleep: Increased Trazodone to 150 mg Q hs for insomnia.  Marijuana use: Counseling for sobriety and long term plans for abstinence. Continue with Cymbalta 90 mgs for mood stabilization. Depression and Anxiety Continue Vistaril 25 mg PO Q6 PRN for Anxiety  Continue Flexeril 5 mg PO BID for pulled muscle/spasms  Will continue to monitor vitals ,medication compliance and treatment side effects while patient is here.  Reviewed labs, no changes noted. CSW will start working on disposition.  Patient to participate in therapeutic milieu. Diflucan 150 mg x 1 dose for yeast infection.  Velna Hatchet May Dominga Ferry  AGNP- [facility] 11/18/2015, 10:28 AM   I reviewed chart and agreed with the findings and treatment Plan.  Kathryne Sharper, MD

## 2015-11-18 NOTE — Progress Notes (Signed)
Recreation Therapy Notes  Date: 12.21.2016  Time: 9:30am Location: [address] Group room   Group Topic: Stress Management  Goal Area(s) Addresses:  Patient will actively participate in stress management techniques presented during session.   Behavioral Response: Did not attend.   Twylla Arceneaux L Anyjah Roundtree, LRT/CTRS        Ellwood Steidle L 11/18/2015 11:43 AM 

## 2015-11-18 NOTE — Progress Notes (Signed)
During patient interview, patient c/o frequent yeast infections through the years.  Will check to see A1C to check if patient is predisposed to developing diabetes. I agree with assessment and plan Madie Renorving A. Dub MikesLugo, M.D.

## 2015-11-18 NOTE — [facility] Group Notes (Signed)
[facility]  11/18/2015 8:45 AM  Participation Quality: Alert, Appropriate and Oriented  Mood/Affect: Appropriate  Depression Rating: 0  Anxiety Rating: 3  Thoughts of Suicide: Pt denies SI/HI  Will you contract for safety? Yes  Current AVH: Pt denies  Plan for Discharge/Comments: Pt attended discharge planning group and actively participated in group. CSW discussed suicide prevention education with the group and encouraged them to discuss discharge planning and any relevant barriers. Pt reports feeling ready for DC today; expresses that anxiety is related to possible changes in her relationships. Pt reports that she is glad to have scheduled appointments for when she leaves.  Transportation Means: Pt reports access to transportation  Supports: No supports mentioned at this time  Chad CordialLauren Carter, Theresia MajorsLCSWA 11/18/2015 9:36 AM

## 2015-11-18 NOTE — Progress Notes (Signed)
D   Pt is depressed and anxious   She reports having an ok day but has not been sleeping good   She is compliant with treatment and interacts well with others   She attends and participates in groups  Pt mood has improved from when she first came into the hospital  A   Verbal support given   Medications administered and effectiveness monitored   Q 15 min checks R   Pt safe at present

## 2015-11-19 DIAGNOSIS — F332 Major depressive disorder, recurrent severe without psychotic features: Principal | ICD-10-CM

## 2015-11-19 MED ORDER — DULOXETINE HCL 30 MG PO CPEP: 90.0000 mg | ORAL_CAPSULE | Freq: Every day | ORAL | Status: DC

## 2015-11-19 MED ORDER — NICOTINE 21 MG/24HR TD PT24: 21.0000 mg | MEDICATED_PATCH | Freq: Every day | TRANSDERMAL | Status: DC

## 2015-11-19 MED ORDER — TRAZODONE HCL 150 MG PO TABS: 150.0000 mg | ORAL_TABLET | Freq: Every day | ORAL | Status: DC

## 2015-11-19 MED ORDER — HYDROXYZINE HCL 25 MG PO TABS: 25.0000 mg | ORAL_TABLET | Freq: Four times a day (QID) | ORAL | Status: DC | PRN

## 2015-11-19 NOTE — Discharge Summary (Signed)
Physician Discharge Summary Note  Patient:  [name] is an 34 y.o., female MRN:  [id number] DOB:  Mar 31, 1981 Patient phone:  [phone] (home)  Patient address:   [address] [place] [address],  Total Time spent with patient: Greater than 30 minutes  Date of Admission:  11/15/2015  Date of Discharge: 11-19-15  Reason for Admission:  Worsening symptoms of depression  Principal Problem: MDD (major depressive disorder), recurrent episode, severe [facility])  Discharge Diagnoses: Patient Active Problem List   Diagnosis Date Noted  . MDD (major depressive disorder), recurrent episode, severe (HCC) [F33.2] 11/16/2015  . Marijuana abuse [F12.10]    Past Psychiatric History: Major depressive disorder, recurrent episodes  Past Medical History:  Past Medical History  Diagnosis Date  . Anxiety   . Irritable bowel syndrome (IBS) .  . Ectopic pregnancy   . Asthma   . Depression     Past Surgical History  Procedure Laterality Date  . Tubal ligation     Family History: History reviewed. No pertinent family history.  Family Psychiatric  History: See H&P  Social History:  History  Alcohol Use  . Yes    Comment: social     History  Drug Use  . Yes  . Special: Marijuana    Social History   Social History  . Marital Status: Single    Spouse Name: N/A  . Number of Children: N/A  . Years of Education: N/A   Social History Main Topics  . Smoking status: Current Every Day Smoker -- 0.50 packs/day    Types: Cigarettes  . Smokeless tobacco: Never Used  . Alcohol Use: Yes     Comment: social  . Drug Use: Yes    Special: Marijuana  . Sexual Activity: Yes    Birth Control/ Protection: None   Other Topics Concern  . None   Social History Narrative   Hospital Course: [name] is an 34 y.o. female initially presented to [facility] with the following " Pt reports that her 32 day old son died 10 years ago in Oct 08, 2023 and the man who is the father recently  broke up with the pt. Pt reports that her ex is already seeing another woman which is upsetting to her. Pt reports that her best friend's mother just passed away suddenly last week and she is still in shock. Pt reports that she has been depressed since her son's death, 10 years ago and was seeing a therapist until 3 months ago. Pt reports that she is taking Cymbalta, which her family doctor prescribes to her. Pt reports that she has been self-medicating by smoking two blunts a day for the last year. Pt reports that she has struggled with anxiety since she was 34 years old but has never seen a psychiatrist. Pt reports that she has lost 10 lbs in the last two months and only gets 3 hours of sleep a night."   Upon her arrival & admision to the adult unit, [name] was evaluated & her presenting symptoms identified. Her UDS lab results showed positive THC. However,  she was not presenting with any substance withdrawal symptoms. She required mood stabiliaztion treatments. [name] was medicated & discharged on; Duloxetine 30 mg for depression, Hydroxyzine 25 mg for anxiety & Trazodone 150 mg for insomnia. She was enrolled & encouraged to participate in the group counseling sessions being offered on this unit. She did & learned coping skills. She presented no other significant health issues that required  treatment or monitoring. [name] was evaluated on daily basis by the clinical providers to assure her response to her treatment regimen.As her treatment progressed, improvement was noted as evidenced by her report of decreasing symptoms, improved sleep, mood, affect, medication tolerance & active participation in the unit programming.She was encouraged to update her providers on her progress by daily completion of a self inventory assessment, noting mood, mental status, pain, any new symptoms, anxiety and or concerns.  [name]'s symptomssponded well to her treatment regimen combined with a therapeutic and supportive environment.  She was motivated for recovery as evidenced by a positive/appropriate behavior and her interaction with the staff & fellow patients.She also worked closely with the treatment team and case manager to develop a discharge plan with appropriate goals to maintain mood stability after discharge. Coping skills, problem solving as well as relaxation therapies were also part of the unit programming.  Upon her hospital discharge, [name] was in much improved condition than upon admission.Her symptoms were reported as significantly decreased or resolved completely. She adamantly denies any SI/HI,  AVH, delusional thoughts & or paranoia. She was motivated to continue taking medication with a goal of continued improvement in mental health. She will continue psychiatric care on an outpatient basis as noted below. She is provided with all the necessary information required to make this appointment without problems. [name] in no apparent distress. Transportation per mother.  Physical Findings:  AIMS: Facial and Oral Movements Muscles of Facial Expression: None, normal Lips and Perioral Area: None, normal Jaw: None, normal Tongue: None, normal,Extremity Movements Upper (arms, wrists, hands, fingers): None, normal Lower (legs, knees, ankles, toes): None, normal, Trunk Movements Neck, shoulders, hips: None, normal, Overall Severity Severity of abnormal movements (highest score from questions above): None, normal Incapacitation due to abnormal movements: None, normal Patient's awareness of abnormal movements (rate only patient's report): No Awareness, Dental Status Current problems with teeth and/or dentures?: No Does patient usually wear dentures?: No  CIWA:  CIWA-Ar Total: 1 COWS:  COWS Total Score: 2  Musculoskeletal: Strength & Muscle Tone: within normal limits Gait & Station: normal Patient leans: N/A  Psychiatric Specialty Exam: Review of Systems  Constitutional:  Negative.   HENT: Negative.   Eyes: Negative.   Respiratory: Negative.   Cardiovascular: Negative.   Gastrointestinal: Negative.   Genitourinary: Negative.   Musculoskeletal: Negative.   Skin: Negative.   Neurological: Negative.   Endo/Heme/Allergies: Negative.   Psychiatric/Behavioral: Positive for depression (Stable) and substance abuse (Cannabis use). Negative for suicidal ideas, hallucinations and memory loss. The patient has insomnia (Stable). The patient is not nervous/anxious.     Blood pressure 96/68, pulse 113, temperature 97.7 F (36.5 C), temperature source Oral, resp. rate 18, height 5\' 8"  (1.727 m), weight 122.925 kg (271 lb), last menstrual period 11/06/2015.Body mass index is 41.22 kg/(m^2).  See Md's SRA   Have you used any form of tobacco in the last 30 days? (Cigarettes, Smokeless Tobacco, Cigars, and/or Pipes): Yes  Has this patient used any form of tobacco in the last 30 days? (Cigarettes, Smokeless Tobacco, Cigars, and/or Pipes) Yes, Yes, A prescription for an FDA-approved tobacco cessation medication was offered at discharge and the patient refused  Metabolic Disorder Labs:  Lab Results  Component Value Date   HGBA1C 5.1 11/11/2014   MPG 100 11/11/2014   No results found for: PROLACTIN No results found for: CHOL, TRIG, HDL, CHOLHDL, VLDL, LDLCALC  See Psychiatric Specialty Exam and Suicide Risk Assessment completed by  Attending Physician prior to discharge.  Discharge destination:  Home  Is patient on multiple antipsychotic therapies at discharge:  No   Has Patient had three or more failed trials of antipsychotic monotherapy by history:  No  Recommended Plan for Multiple Antipsychotic Therapies: NA    Medication List    STOP taking these medications        ALPRAZolam 0.5 MG tablet  Commonly known as:  XANAX      TAKE these medications      Indication   DULoxetine 30 MG capsule  Commonly known as:  CYMBALTA  Take 3 capsules (90 mg total) by  mouth daily. For depression   Indication:  Major Depressive Disorder     hydrOXYzine 25 MG tablet  Commonly known as:  ATARAX/VISTARIL  Take 1 tablet (25 mg total) by mouth every 6 (six) hours as needed for anxiety.   Indication:  Anxiety     nicotine 21 mg/24hr patch  Commonly known as:  NICODERM CQ - dosed in mg/24 hours  Place 1 patch (21 mg total) onto the skin daily. For smoking cessation   Indication:  Nicotine Addiction     traZODone 150 MG tablet  Commonly known as:  DESYREL  Take 1 tablet (150 mg total) by mouth at bedtime. For sleep   Indication:  Trouble Sleeping       Follow-up Information    Follow up with [facility].   Why:  Therapy assessment with [name] on Tuesday Dec. 27th at 1pm. Medication management appt with Merlyn Albert, NP on Monday January 9th at 1pm. Bring insurance card to office. Call if you need to reschedule.    Contact information:   [address] [place] [address] [phone]      Please follow up.   Why:  Call [phone] to schedule Medicaid transportation 48 hours prior to appointments.     Follow-up recommendations:  Activity:  As tolerated Diet: As recommended by your primary care doctor. Keep all scheduled follow-up appointments as recommended.  Comments: Take all your medications as prescribed by your mental healthcare provider. Report any adverse effects and or reactions from your medicines to your outpatient provider promptly. Patient is instructed and cautioned to not engage in alcohol and or illegal drug use while on prescription medicines. In the event of worsening symptoms, patient is instructed to call the crisis hotline, 911 and or go to the nearest ED for appropriate evaluation and treatment of symptoms. Follow-up with your primary care provider for your other medical issues, concerns and or health care needs.   Signed: Sanjuana Kava, PMHNP, FNP-BC 11/19/2015, 3:33 PM    Patient seen face to  face for psychiatric evaluation. Chart reviewed and finding discussed with Physician extender. Agreed with disposition and treatment plan.   Kathryne Sharper, MD

## 2015-11-19 NOTE — [facility] Group Notes (Signed)
[facility] Group Notes:  (Nursing/MHT/Case Management/Adjunct)  Date:  11/19/2015  Time:  11:38 AM  Type of Therapy:  Nurse Education  Participation Level:  Did Not Attend  Participation Quality:    Affect:    Cognitive:    Insight:    Engagement in Group:    Modes of Intervention:  Discussion and Education  Summary of Progress/Problems:  Group topic today is Lifestyle and Leisure changes. Talked about coping skills. [name] chose not to attend.    Norm ParcelHeather V Abhishek Levesque 11/19/2015, 11:38 AM

## 2015-11-19 NOTE — Progress Notes (Signed)
D   Pt is depressed and anxious   She reports having an ok day but has not been sleeping good   She is compliant with treatment and interacts well with others   She attends and participates in groups  Pt mood has improved from when she first came into the hospital  A   Verbal support given   Medications administered and effectiveness monitored   Q 15 min checks R   Pt safe at present 

## 2015-11-19 NOTE — Progress Notes (Signed)
  [facility] Discharge Plan :  Will you be returning to the same living situation after discharge:  Yes,  Pt returning home At discharge, do you have transportation home?: Yes,  mother to pick up Do you have the ability to pay for your medications: Yes,  Pt provided with 30-day prescription  Release of information consent forms completed and in the chart;  Patient's signature needed at discharge.  Patient to Follow up at: Follow-up Information    Follow up with [facility].   Why:  Therapy assessment with Mordecai RasmussenHannah Coble on Tuesday Dec. 27th at 1pm. Medication management appt with [name], NP on Monday January 9th at 1pm. Bring insurance card to office. Call if you need to reschedule.    Contact information:   [address] [place] [address] [phone]      Please follow up.   Why:  Call [phone] to schedule Medicaid transportation 48 hours prior to appointments.      Next level of care provider [facility] Link:no  Safety Planning and Suicide Prevention discussed: Yes,  with Pt; declined family contact  Have you used any form of tobacco in the last 30 days? (Cigarettes, Smokeless Tobacco, Cigars, and/or Pipes): Yes  Has patient been referred to the Quitline?: Patient refused referral  Patient has been referred for addiction treatment: Yes  Elaina HoopsCarter, Aivah Putman M 11/19/2015, 10:03 AM

## 2015-11-19 NOTE — Tx Team (Signed)
Interdisciplinary Treatment Plan Update (Adult) Date: 11/19/2015    Time Reviewed: 9:30 AM  Progress in Treatment: Attending groups: Yes Participating in groups: Yes Taking medication as prescribed: Yes Tolerating medication: Yes Family/Significant other contact made: No, Pt declines family contact Patient understands diagnosis: Yes Discussing patient identified problems/goals with staff: Yes Medical problems stabilized or resolved: Yes Denies suicidal/homicidal ideation: Yes Issues/concerns per patient self-inventory: Yes Other:  New problem(s) identified: N/A  Discharge Plan or Barriers: Pt returning home and will follow-up with [facility]  Reason for Continuation of Hospitalization:  Depression Anxiety Medication Stabilization   Comments: N/A  Estimated length of stay: 0 days; Pt stable for DC   Patient is a 34 year old female admitted for depression and anxiety. Stressors include multiple losses including the death of her child 10 years ago and a recent break up. Patient will benefit from crisis stabilization, medication evaluation, group therapy, and psycho education in addition to case management for discharge planning. Patient and CSW reviewed pt's identified goals and treatment plan. Pt verbalized understanding and agreed to treatment plan.     Review of initial/current patient goals per problem list:  1. Goal(s): Patient will participate in aftercare plan   Met: Yes   Target date: 3-5 days post admission date   As evidenced by: Patient will participate within aftercare plan AEB aftercare provider and housing plan at discharge being identified.    12/19: Goal met. Patient will likely return home to follow up with outpatient services.     2. Goal (s): Patient will exhibit decreased depressive symptoms and suicidal ideations.   Met: Yes   Target date: 3-5 days post admission date   As evidenced by: Patient will utilize self  rating of depression at 3 or below and demonstrate decreased signs of depression or be deemed stable for discharge by MD.   12/19: Goal not met: Pt presents with flat affect and depressed mood.  Pt admitted with depression rating of 10.  Pt to show decreased sign of depression and a rating of 3 or less before d/c.    12/22: Pt rates depression at 0/10; denies SI    3. Goal(s): Patient will demonstrate decreased signs and symptoms of anxiety.   Met: Yes   Target date: 3-5 days post admission date   As evidenced by: Patient will utilize self rating of anxiety at 3 or below and demonstrated decreased signs of anxiety, or be deemed stable for discharge by MD    12/19: Goal not met: Pt presents with anxious mood and affect.  Pt admitted with anxiety rating of 10.  Pt to show decreased sign of anxiety and a rating of 3 or less before d/c.  12/22: Pt rates anxiety at 3/10; denies SI     4. Goal(s): Patient will demonstrate decreased signs of withdrawal due to substance abuse   Met: Yes   Target date: 3-5 days post admission date   As evidenced by: Patient will produce a CIWA/COWS score of 0, have stable vitals signs, and no symptoms of withdrawal    12/15: Goal met. No withdrawal symptoms reported at this time per medical chart.      Attendees: Patient:    Family:    Physician:  11/16/2015 9:30 AM  Nursing: Cephus Richer, RN 11/16/2015 9:30 AM  Clinical Social Worker: Erasmo Downer Drink[place], [place] 11/16/2015 9:30 AM  Other: Peri Maris, LCSWA 11/16/2015 9:30 AM  Other:  11/16/2015 9:30 AM  Other: Lars Pinks, Case Manager 11/16/2015 9:30 AM  Other: Agustina Caroli , NP 11/16/2015 9:30 AM  Other:    Other:           Scribe for Treatment Team:  Peri Maris, [place] Work [phone]

## 2015-11-19 NOTE — [facility] Suicide Risk Assessment (Signed)
[facility]   Demographic Factors:  Caucasian  Total Time spent with patient: 45 minutes  Musculoskeletal: Strength & Muscle Tone: within normal limits Gait & Station: normal Patient leans: N/A  Psychiatric Specialty Exam: Physical Exam  ROS  Blood pressure 96/68, pulse 113, temperature 97.7 F (36.5 C), temperature source Oral, resp. rate 18, height 5\' 8"  (1.727 m), weight 122.925 kg (271 lb), last menstrual period 11/06/2015.Body mass index is 41.22 kg/(m^2).  General Appearance: Casual  Eye Contact::  Good  Speech:  Normal Rate409  Volume:  Normal  Mood:  Anxious  Affect:  Appropriate and Congruent  Thought Process:  Goal Directed  Orientation:  Full (Time, Place, and Person)  Thought Content:  WDL  Suicidal Thoughts:  No  Homicidal Thoughts:  No  Memory:  Immediate;   Fair Recent;   Good Remote;   Good  Judgement:  Good  Insight:  Good  Psychomotor Activity:  Normal  Concentration:  Fair  Recall:  Good  Fund of Knowledge:Good  Language: Good  Akathisia:  No  Handed:  Right  AIMS (if indicated):     Assets:  Communication Skills Desire for Improvement Financial Resources/Insurance Housing Physical Health Social Support Transportation  Sleep:  Number of Hours: 5.75  Cognition: WNL  ADL's:  Intact   Have you used any form of tobacco in the last 30 days? (Cigarettes, Smokeless Tobacco, Cigars, and/or Pipes): Yes  Has this patient used any form of tobacco in the last 30 days? (Cigarettes, Smokeless Tobacco, Cigars, and/or Pipes) Yes, Prescription not provided because: The patient refused to quit smoking  Mental Status Per Nursing Assessment::   On Admission:  Suicidal ideation indicated by patient  Current Mental Status by Physician: See above  Loss Factors: NA  Historical Factors: Family history of mental illness or substance abuse  Risk Reduction Factors:   Responsible for children under 36 years of age, Sense of  responsibility to family, Religious beliefs about death, Living with another person, especially a relative, Positive social support, Positive therapeutic relationship and Positive coping skills or problem solving skills  Continued Clinical Symptoms:  Alcohol/Substance Abuse/Dependencies Unstable or Poor Therapeutic Relationship  Cognitive Features That Contribute To Risk:  None    Suicide Risk:  Minimal: No identifiable suicidal ideation.  Patients presenting with no risk factors but with morbid ruminations; may be classified as minimal risk based on the severity of the depressive symptoms  Principal Problem: MDD (major depressive disorder), recurrent episode, severe [facility](HCC) Discharge Diagnoses:  Patient Active Problem List   Diagnosis Date Noted  . MDD (major depressive disorder), recurrent episode, severe (HCC) [F33.2] 11/16/2015  . Marijuana abuse [F12.10]     Follow-up Information    Follow up with [facility].   Why:  Therapy assessment with Michele RasmussenHannah Oconnor on Tuesday Dec. 27th at 1pm. Medication management appt with Michele AlbertFred, NP on Monday January 9th at 1pm. Bring insurance card to office. Call if you need to reschedule.    Contact information:   [address] [place] [id number] [phone]      Please follow up.   Why:  Call [phone] to schedule Medicaid transportation 48 hours prior to appointments.      Plan Of Care/Follow-up recommendations:  Activity:  As tolerated Diet:  Unchanged from the past  Is patient on multiple antipsychotic therapies at discharge:  No   Has Patient had three or more failed trials of antipsychotic monotherapy by history:  No  Recommended Plan for Multiple Antipsychotic Therapies: NA    [name] T. 11/19/2015, 11:15 AM

## 2015-11-20 LAB — HEMOGLOBIN A1C
Hgb A1c MFr Bld: 5.2 % (ref 4.8–5.6)
Mean Plasma Glucose: 103 mg/dL

## 2015-12-24 ENCOUNTER — Ambulatory Visit (INDEPENDENT_AMBULATORY_CARE_PROVIDER_SITE_OTHER): Payer: Medicaid Other | Admitting: Physician Assistant

## 2015-12-24 ENCOUNTER — Encounter: Payer: Self-pay | Admitting: Physician Assistant

## 2015-12-24 VITALS — BP 110/60 | HR 104 | Temp 98.8°F | Resp 18 | Wt 272.0 lb

## 2015-12-24 DIAGNOSIS — N760 Acute vaginitis: Secondary | ICD-10-CM | POA: Diagnosis not present

## 2015-12-24 DIAGNOSIS — J029 Acute pharyngitis, unspecified: Secondary | ICD-10-CM

## 2015-12-24 DIAGNOSIS — J020 Streptococcal pharyngitis: Secondary | ICD-10-CM

## 2015-12-24 LAB — WET PREP FOR TRICH, YEAST, CLUE
Clue Cells Wet Prep HPF POC: NONE SEEN
TRICH WET PREP: NONE SEEN
YEAST WET PREP: NONE SEEN

## 2015-12-24 MED ORDER — AMOXICILLIN 875 MG PO TABS: 875.0000 mg | ORAL_TABLET | Freq: Two times a day (BID) | ORAL | Status: DC

## 2015-12-24 NOTE — Progress Notes (Signed)
Patient ID: [name] MRN: [id number], DOB: 08-20-1981, 35 y.o. Date of Encounter: @  Chief Complaint:  Chief Complaint  Patient presents with  . sick x 3 days    sore throat, left neck,ear pain  . STD check    wants pelvic for GC/Chlam    HPI: 35 y.o. year old white female  presents with above.   She states that she has had sore throat for 3 days. Mostly sore on the left side and feels discomfort towards the left ear. Says that she has had no drainage from the nose has had no chest congestion or cough. Subjective fever but has not checked her thermometer so not certain of this.  Says that she has had some vaginal irritation. Says that it might be from recent condom use with sex. Not sure if this has just caused irritation or whether she may have vaginal infection so wanted to check for vaginal infection while here. Does not want full STD screen.   Past Medical History  Diagnosis Date  . Anxiety   . Irritable bowel syndrome (IBS) .  . Ectopic pregnancy   . Asthma   . Depression      Home Meds: Outpatient Prescriptions Prior to Visit  Medication Sig Dispense Refill  . DULoxetine (CYMBALTA) 30 MG capsule Take 3 capsules (90 mg total) by mouth daily. For depression 90 capsule 0  . hydrOXYzine (ATARAX/VISTARIL) 25 MG tablet Take 1 tablet (25 mg total) by mouth every 6 (six) hours as needed for anxiety. 60 tablet 0  . traZODone (DESYREL) 150 MG tablet Take 1 tablet (150 mg total) by mouth at bedtime. For sleep 30 tablet 0  . nicotine (NICODERM CQ - DOSED IN MG/24 HOURS) 21 mg/24hr patch Place 1 patch (21 mg total) onto the skin daily. For smoking cessation (Patient not taking: Reported on 12/24/2015) 28 patch 0   No facility-administered medications prior to visit.    Allergies:  Allergies  Allergen Reactions  . Latex Rash    Reaction to gloves    Social History   Social History  . Marital Status: Single    Spouse Name: N/A  . Number of Children: N/A  . Years  of Education: N/A   Occupational History  . Not on file.   Social History Main Topics  . Smoking status: Current Every Day Smoker -- 0.50 packs/day    Types: Cigarettes  . Smokeless tobacco: Never Used  . Alcohol Use: Yes     Comment: social  . Drug Use: Yes    Special: Marijuana  . Sexual Activity: Yes    Birth Control/ Protection: None   Other Topics Concern  . Not on file   Social History Narrative    History reviewed. No pertinent family history.   Review of Systems:  See HPI for pertinent ROS. All other ROS negative.    Physical Exam: Blood pressure 110/60, pulse 104, temperature 98.8 F (37.1 C), temperature source Oral, resp. rate 18, weight 272 lb (123.378 kg)., Body mass index is 41.37 kg/(m^2). General: Obese white female. Appears in no acute distress. Head: Normocephalic, atraumatic, eyes without discharge, sclera non-icteric, nares are without discharge. Bilateral auditory canals clear, TM's are without perforation, pearly grey and translucent with reflective cone of light bilaterally. Oral cavity moist, posterior pharynx  With mild erythema. No exudate, no peritonsillar abscess.  Neck: Supple. No thyromegaly. She reports tenderness with palpation along this cervical lymph nodes on the left but no tenderness on  the right. No enlarged nodes palpable. Lungs: Clear bilaterally to auscultation without wheezes, rales, or rhonchi. Breathing is unlabored. Heart: RRR with S1 S2. No murmurs, rubs, or gallops. Musculoskeletal:  Strength and tone normal for age. Pelvic Exam: External genitalia normal. Vaginal mucosa normal. Cervix normal. No vaginal discharge present. Extremities/Skin: Warm and dry. Neuro: Alert and oriented X 3. Moves all extremities spontaneously. Gait is normal. CNII-XII grossly in tact. Psych:  Responds to questions appropriately with a normal affect.   Results for orders placed or performed in visit on 12/24/15  WET PREP FOR TRICH, YEAST, CLUE  Result  Value Ref Range   Yeast Wet Prep HPF POC NONE SEEN NONE SEEN   Trich, Wet Prep NONE SEEN NONE SEEN   Clue Cells Wet Prep HPF POC NONE SEEN NONE SEEN   WBC, Wet Prep HPF POC FEW NONE SEEN   Rapid Strep Test: Positive  ASSESSMENT AND PLAN:  35 y.o. year old female with  1. Strep pharyngitis Rapid Strep Test: Positive Start Amoxicillin immediately take as directed and complete all of it. Can use over-the-counter lozenges spray Tylenol Motrin as needed for pain relief F/U if symptoms do not resolve upon completion of antibiotic. - amoxicillin (AMOXIL) 875 MG tablet; Take 1 tablet (875 mg total) by mouth 2 (two) times daily.  Dispense: 20 tablet; Refill: 0  2. Sorethroat - Rapid strep screen (not at [facility])  3. Vaginitis and vulvovaginitis Reassured her that pelvic exam appeared normal. Wet prep negative. We'll follow up on gonorrhea chlamydia results. - GC/Chlamydia Probe Amp - WET PREP FOR TRICH, YEAST, CLUE   Signed, [address] [place], [place], [place] 12/24/2015 11:00 AM

## 2015-12-25 LAB — GC/CHLAMYDIA PROBE AMP
CT Probe RNA: NOT DETECTED
GC Probe RNA: NOT DETECTED

## 2015-12-28 ENCOUNTER — Telehealth: Payer: Self-pay | Admitting: *Deleted

## 2015-12-28 MED ORDER — FLUCONAZOLE 150 MG PO TABS: 150.0000 mg | ORAL_TABLET | Freq: Once | ORAL | Status: DC

## 2015-12-28 NOTE — Telephone Encounter (Signed)
Pt called stating she was just seen on Thursday with Sorethroat was given antibiotic and now states that she is having yeast infection from the antibiotic and wants to know if you could prescribe her some Diflucan or something else to help with the irritation?  [facility]  Call back number [phone]

## 2015-12-28 NOTE — Telephone Encounter (Signed)
Diflucan 150 mg 1 dose dispense #1+0

## 2015-12-28 NOTE — Telephone Encounter (Signed)
Pt aware of Rx.

## 2015-12-30 LAB — STREP GROUP A AG, W/REFLEX TO CULT: STREGTOCOCCUS GROUP A AG SCREEN: DETECTED — AB

## 2015-12-30 NOTE — Addendum Note (Signed)
Addended by: Alean Rinne A on: 12/30/2015 03:44 PM   Modules accepted: Orders

## 2016-04-08 ENCOUNTER — Encounter (HOSPITAL_COMMUNITY): Payer: Self-pay | Admitting: Emergency Medicine

## 2016-04-08 ENCOUNTER — Emergency Department (HOSPITAL_COMMUNITY)
Admission: EM | Admit: 2016-04-08 | Discharge: 2016-04-08 | Disposition: A | Discharge status: Home / Self Care | Payer: Medicaid Other | Attending: Emergency Medicine | Admitting: Emergency Medicine

## 2016-04-08 DIAGNOSIS — F17210 Nicotine dependence, cigarettes, uncomplicated: Secondary | ICD-10-CM | POA: Diagnosis not present

## 2016-04-08 DIAGNOSIS — Z79899 Other long term (current) drug therapy: Secondary | ICD-10-CM | POA: Diagnosis not present

## 2016-04-08 DIAGNOSIS — R531 Weakness: Secondary | ICD-10-CM | POA: Diagnosis not present

## 2016-04-08 DIAGNOSIS — J45909 Unspecified asthma, uncomplicated: Secondary | ICD-10-CM | POA: Insufficient documentation

## 2016-04-08 DIAGNOSIS — F419 Anxiety disorder, unspecified: Secondary | ICD-10-CM | POA: Insufficient documentation

## 2016-04-08 DIAGNOSIS — M7631 Iliotibial band syndrome, right leg: Secondary | ICD-10-CM | POA: Diagnosis not present

## 2016-04-08 DIAGNOSIS — Z91040 Latex allergy status: Secondary | ICD-10-CM | POA: Diagnosis not present

## 2016-04-08 DIAGNOSIS — M25551 Pain in right hip: Secondary | ICD-10-CM | POA: Diagnosis present

## 2016-04-08 DIAGNOSIS — F329 Major depressive disorder, single episode, unspecified: Secondary | ICD-10-CM | POA: Diagnosis not present

## 2016-04-08 MED ORDER — NAPROXEN 500 MG PO TABS: 500.0000 mg | ORAL_TABLET | Freq: Two times a day (BID) | ORAL | Status: DC

## 2016-04-08 NOTE — Discharge Instructions (Signed)
Please read and follow all provided instructions.  Your diagnoses today include:  1. IT band syndrome, right    Tests performed today include:  Vital signs. See below for your results today.   Medications prescribed:   Take as prescribed   Home care instructions:  Follow any educational materials contained in this packet.  Follow-up instructions: Please follow-up with your primary care provider in the next week for further evaluation of symptoms and treatment   Return instructions:   Please return to the Emergency Department if you do not get better, if you get worse, or new symptoms OR  - Fever (temperature greater than 101.24F)  - Bleeding that does not stop with holding pressure to the area    -Severe pain (please note that you may be more sore the day after your accident)  - Chest Pain  - Difficulty breathing  - Severe nausea or vomiting  - Inability to tolerate food and liquids  - Passing out  - Skin becoming red around your wounds  - Change in mental status (confusion or lethargy)  - New numbness or weakness     Please return if you have any other emergent concerns.  Additional Information:  Your vital signs today were: BP 111/54 mmHg   Pulse 78   Temp(Src) 98.5 F (36.9 C) (Oral)   Resp 18   Ht 5\' 8"  (1.727 m)   Wt 124.739 kg   BMI 41.82 kg/m2   SpO2 96%   LMP 03/28/2016 If your blood pressure (BP) was elevated above 135/85 this visit, please have this repeated by your doctor within one month. ---------------

## 2016-04-08 NOTE — ED Notes (Signed)
C/o right hip pain x "a few weeks". No known injury. Has not taken any Ibuprofen or Tylenol.

## 2016-04-08 NOTE — ED Provider Notes (Signed)
CSN: [id number]     Arrival date & time 04/08/16  0908 History  By signing my name below, I, [name], attest that this documentation has been prepared under the direction and in the presence of Michele Piliyler Elianny Buxbaum, PA-C. Electronically Signed: Placido SouLogan Oconnor, ED Scribe. 04/08/2016. 9:45 AM.   Chief Complaint  Patient presents with  . Hip Pain   The history is provided by the patient. No language interpreter was used.    HPI Comments: [name] is a 35 y.o. female who presents to the Emergency Department complaining of worsening, mild at rest and 7/10 with movement, atraumatic, non-radiating, right lateral hip pain x 2 weeks. Pt states that she her pain worsens throughout the day into the evening and will experience associated, intermittent, RLE weakness when ambulating that causes her to nearly fall as well as mild, intermittent, paraesthesia. Pt denies having taken any medications for her symptoms. She denies a PMHx of kidney or liver issues. She denies pelvic pain, dysuria, fevers, chills, n/v or any other associated symptoms at this time.   Past Medical History  Diagnosis Date  . Anxiety   . Irritable bowel syndrome (IBS) .  . Ectopic pregnancy   . Asthma   . Depression    Past Surgical History  Procedure Laterality Date  . Tubal ligation     No family history on file. Social History  Substance Use Topics  . Smoking status: Current Every Day Smoker -- 0.50 packs/day    Types: Cigarettes  . Smokeless tobacco: Never Used  . Alcohol Use: Yes     Comment: social   OB History    No data available     Review of Systems  Constitutional: Negative for fever and chills.  Gastrointestinal: Negative for nausea and vomiting.  Genitourinary: Negative for dysuria and pelvic pain.  Musculoskeletal: Positive for myalgias and arthralgias.  Neurological: Positive for weakness.   Allergies  Latex  Home Medications   Prior to Admission medications   Medication Sig Start Date End Date  Taking? Authorizing Provider  amoxicillin (AMOXIL) 875 MG tablet Take 1 tablet (875 mg total) by mouth 2 (two) times daily. 12/24/15   Patriciaann ClanMary B Dixon, PA-C  DULoxetine (CYMBALTA) 30 MG capsule Take 3 capsules (90 mg total) by mouth daily. For depression 11/19/15   Sanjuana KavaAgnes I Nwoko, NP  fluconazole (DIFLUCAN) 150 MG tablet Take 1 tablet (150 mg total) by mouth once. 12/28/15   Patriciaann ClanMary B Dixon, PA-C  hydrOXYzine (ATARAX/VISTARIL) 25 MG tablet Take 1 tablet (25 mg total) by mouth every 6 (six) hours as needed for anxiety. 11/19/15   Sanjuana KavaAgnes I Nwoko, NP  nicotine (NICODERM CQ - DOSED IN MG/24 HOURS) 21 mg/24hr patch Place 1 patch (21 mg total) onto the skin daily. For smoking cessation Patient not taking: Reported on 12/24/2015 11/19/15   Sanjuana KavaAgnes I Nwoko, NP  traZODone (DESYREL) 150 MG tablet Take 1 tablet (150 mg total) by mouth at bedtime. For sleep 11/19/15   Sanjuana KavaAgnes I Nwoko, NP   BP 111/54 mmHg  Pulse 78  Temp(Src) 98.5 F (36.9 C) (Oral)  Resp 18  Ht 5\' 8"  (1.727 m)  Wt 275 lb (124.739 kg)  BMI 41.82 kg/m2  SpO2 96%  LMP 03/28/2016    Physical Exam  Constitutional: She is oriented to person, place, and time. She appears well-developed and well-nourished.  HENT:  Head: Normocephalic and atraumatic.  Eyes: EOM are normal.  Neck: Normal range of motion.  Cardiovascular: Normal rate and regular rhythm.  Pulmonary/Chest: Effort normal. No respiratory distress.  Abdominal: Soft.  Musculoskeletal: Normal range of motion.       Right hip: Normal. She exhibits normal range of motion, normal strength, no tenderness, no bony tenderness, no swelling, no crepitus, no deformity and no laceration.  Full ROM of hip join. Internal/external rotation without pain. No crepitus. Neurovascularly intact.   Neurological: She is alert and oriented to person, place, and time.  Skin: Skin is warm and dry.  Psychiatric: She has a normal mood and affect. Her behavior is normal. Thought content normal.  Nursing note and  vitals reviewed.  ED Course  Procedures  DIAGNOSTIC STUDIES: Oxygen Saturation is 96% on RA, normal by my interpretation.    COORDINATION OF CARE: 9:42 AM Discussed next steps with pt. She verbalized understanding and is agreeable with the plan.   Labs Review Labs Reviewed - No data to display  Imaging Review No results found.   EKG Interpretation None      MDM  I have reviewed the relevant previous healthcare records. I obtained HPI from historian.  ED Course:  Assessment: Pt is a 35yF who presents with right hip pain. On exam, pt in NAD. Nontoxic/nonseptic appearing. VSS. Afebrile. Full ROM of right hip without discomfort. No crepitus. Neurovascularly intact. Suspect IT band. Pt is obese and may play a role in this. Given Naprosyn in ED. Plan is to DC home with follow up to PCP. Counseled on stretching and use of foam roller At time of discharge, Patient is in no acute distress. Vital Signs are stable. Patient is able to ambulate. Patient able to tolerate PO.    Disposition/Plan:  DC Home Additional Verbal discharge instructions given and discussed with patient.  Pt Instructed to f/u with PCP in the next week for evaluation and treatment of symptoms. Return precautions given Pt acknowledges and agrees with plan  Supervising Physician Benjiman Core, MD   Final diagnoses:  IT band syndrome, right    I personally performed the services described in this documentation, which was scribed in my presence. The recorded information has been reviewed and is accurate.   Michele Pili, PA-C 04/08/16 1914  Benjiman Core, MD 04/08/16 1525

## 2016-07-13 ENCOUNTER — Encounter (HOSPITAL_COMMUNITY): Payer: Self-pay

## 2016-07-13 DIAGNOSIS — R350 Frequency of micturition: Secondary | ICD-10-CM | POA: Insufficient documentation

## 2016-07-13 DIAGNOSIS — J45909 Unspecified asthma, uncomplicated: Secondary | ICD-10-CM | POA: Diagnosis not present

## 2016-07-13 DIAGNOSIS — Z91040 Latex allergy status: Secondary | ICD-10-CM | POA: Insufficient documentation

## 2016-07-13 DIAGNOSIS — F17210 Nicotine dependence, cigarettes, uncomplicated: Secondary | ICD-10-CM | POA: Diagnosis not present

## 2016-07-13 DIAGNOSIS — Z79899 Other long term (current) drug therapy: Secondary | ICD-10-CM | POA: Diagnosis not present

## 2016-07-13 LAB — URINALYSIS, ROUTINE W REFLEX MICROSCOPIC
Glucose, UA: NEGATIVE mg/dL
HGB URINE DIPSTICK: NEGATIVE
Ketones, ur: NEGATIVE mg/dL
Leukocytes, UA: NEGATIVE
Nitrite: NEGATIVE
PH: 6.5 (ref 5.0–8.0)
Protein, ur: NEGATIVE mg/dL
SPECIFIC GRAVITY, URINE: 1.028 (ref 1.005–1.030)

## 2016-07-13 NOTE — ED Triage Notes (Signed)
Pt reports urinary frequency for three days. She denies dysuria or hematuria. Denies vaginal bleeding or discharge. Denies flank pain.

## 2016-07-14 ENCOUNTER — Emergency Department (HOSPITAL_COMMUNITY)
Admission: EM | Admit: 2016-07-14 | Discharge: 2016-07-14 | Disposition: A | Discharge status: Home / Self Care | Payer: Medicaid Other | Attending: Emergency Medicine | Admitting: Emergency Medicine

## 2016-07-14 DIAGNOSIS — R350 Frequency of micturition: Secondary | ICD-10-CM

## 2016-07-14 LAB — PREGNANCY, URINE: Preg Test, Ur: NEGATIVE

## 2016-07-14 MED ORDER — PHENAZOPYRIDINE HCL 200 MG PO TABS: 200.0000 mg | ORAL_TABLET | Freq: Three times a day (TID) | ORAL | 0 refills | Status: DC

## 2016-07-14 NOTE — ED Notes (Signed)
Pt provided with d/c instructions at this time.  Pt verbalizes understanding of d/c instructions as well as follow up procedure after d/c.  Pt provided with RX for pyridium at this time. Pt verbalizes understanding of RX directions. Pt in no apparent distress at this time. Pt ambulatory at time of d/c.

## 2016-07-14 NOTE — Discharge Instructions (Signed)
As we discussed your urine test was normal. Pyridium will help with your frequency-- however this is temporary.  This medication will also turn her urine orange/red which is normal. Recommend that you increase your free water intake as soda, coffee, and tea contain caffeine which works as a diuretic and will make you urinate frequently. You may follow-up with urology if issues with frequency do not improve with supportive measures. May also wish to follow-up with your primary care doctor. Return here for any new or worsening symptoms.

## 2016-07-14 NOTE — ED Notes (Signed)
Pt reports increased "pressure" during urination that has been going on for 3 days.  Pt states that she has urinary frequency as well over the same time frame.   Chief Complaint  Patient presents with  . Urinary Frequency   Past Medical History:  Diagnosis Date  . Anxiety   . Asthma   . Depression   . Ectopic pregnancy   . Irritable bowel syndrome (IBS) .

## 2016-07-14 NOTE — ED Provider Notes (Signed)
[facility]-EMERGENCY DEPT Provider Note   CSN: [id number] Arrival date & time: 07/13/16  2236     History   Chief Complaint Chief Complaint  Patient presents with  . Urinary Frequency    HPI [name] is a 35 y.o. female.  The history is provided by the patient and medical records.  Urinary Frequency    35 year old female with history of anxiety, asthma, depression, IBS, presenting to the ED for urinary frequency. States this is been ongoing for the past 3 days. She states she feels "pressure" and "fullness" in her suprapubic region and sensation of needing to urinate very often.  She denies any dysuria or hematuria. She denies any flank pain, fever, or chills. No pelvic pain or vaginal discharge. Patient is not diabetic.  Of note, patient reports she exclusively drinks soda and tea.  States she almost never drinks water as she does not like the taste.  Past Medical History:  Diagnosis Date  . Anxiety   . Asthma   . Depression   . Ectopic pregnancy   . Irritable bowel syndrome (IBS) .    Patient Active Problem List   Diagnosis Date Noted  . MDD (major depressive disorder), recurrent episode, severe (HCC) 11/16/2015  . Marijuana abuse     Past Surgical History:  Procedure Laterality Date  . TONSILLECTOMY    . TUBAL LIGATION      OB History    No data available       Home Medications    Prior to Admission medications   Medication Sig Start Date End Date Taking? Authorizing Provider  amoxicillin (AMOXIL) 875 MG tablet Take 1 tablet (875 mg total) by mouth 2 (two) times daily. 12/24/15   Patriciaann ClanMary B Dixon, PA-C  DULoxetine (CYMBALTA) 30 MG capsule Take 3 capsules (90 mg total) by mouth daily. For depression 11/19/15   Sanjuana KavaAgnes I Nwoko, NP  fluconazole (DIFLUCAN) 150 MG tablet Take 1 tablet (150 mg total) by mouth once. 12/28/15   Patriciaann ClanMary B Dixon, PA-C  hydrOXYzine (ATARAX/VISTARIL) 25 MG tablet Take 1 tablet (25 mg total) by mouth every 6 (six) hours as needed for anxiety. 11/19/15    Sanjuana KavaAgnes I Nwoko, NP  naproxen (NAPROSYN) 500 MG tablet Take 1 tablet (500 mg total) by mouth 2 (two) times daily. 04/08/16   Audry Piliyler Mohr, PA-C  nicotine (NICODERM CQ - DOSED IN MG/24 HOURS) 21 mg/24hr patch Place 1 patch (21 mg total) onto the skin daily. For smoking cessation Patient not taking: Reported on 12/24/2015 11/19/15   Sanjuana KavaAgnes I Nwoko, NP  traZODone (DESYREL) 150 MG tablet Take 1 tablet (150 mg total) by mouth at bedtime. For sleep 11/19/15   Sanjuana KavaAgnes I Nwoko, NP    Family History No family history on file.  Social History Social History  Substance Use Topics  . Smoking status: Current Every Day Smoker    Packs/day: 0.50    Types: Cigarettes  . Smokeless tobacco: Never Used  . Alcohol use Yes     Comment: social     Allergies   Latex   Review of Systems Review of Systems  Genitourinary: Positive for frequency.  All other systems reviewed and are negative.    Physical Exam Updated Vital Signs BP 99/69   Pulse 78   Temp 98.4 F (36.9 C) (Oral)   Resp 16   LMP 06/26/2016   SpO2 98%   Physical Exam  Constitutional: She is oriented to person, place, and time. She appears well-developed and well-nourished.  HENT:  Head: Normocephalic and atraumatic.  Mouth/Throat: Oropharynx is clear and moist.  Eyes: Conjunctivae and EOM are normal. Pupils are equal, round, and reactive to light.  Neck: Normal range of motion.  Cardiovascular: Normal rate, regular rhythm and normal heart sounds.   Pulmonary/Chest: Effort normal and breath sounds normal.  Abdominal: Soft. Bowel sounds are normal. She exhibits no distension. There is no tenderness. There is no guarding.  No CVA tenderness  Musculoskeletal: Normal range of motion.  Neurological: She is alert and oriented to person, place, and time.  Skin: Skin is warm and dry.  Psychiatric: She has a normal mood and affect.  Nursing note and vitals reviewed.    ED Treatments / Results  Labs (all labs ordered are listed, but  only abnormal results are displayed) Labs Reviewed  URINALYSIS, ROUTINE W REFLEX MICROSCOPIC (NOT AT [facility]) - Abnormal; Notable for the following:       Result Value   Bilirubin Urine SMALL (*)    All other components within normal limits  PREGNANCY, URINE    EKG  EKG Interpretation None       Radiology No results found.  Procedures Procedures (including critical care time)  Medications Ordered in ED Medications - No data to display   Initial Impression / Assessment and Plan / ED Course  I have reviewed the triage vital signs and the nursing notes.  Pertinent labs & imaging results that were available during my care of the patient were reviewed by me and considered in my medical decision making (see chart for details).  Clinical Course   35 year old female here with urinary frequency. She denies any dysuria or hematuria. No flank pain, fever, or chills. She is afebrile and nontoxic. Abdominal exam is benign, no CVA tenderness. Urine pregnancy test is negative. UA without any signs of infection. No glucosuria, patient is not diabetic.  Her urinary frequency may be due to her excessive intake of caffeinated drinks which I discussed with her can act as a diuretic and make her urinate more frequently. Encouraged her to increase her free water intake.  Pyridium script given for a few days for temporary relief.  Given urology follow-up if not improving with supportive measures.  Discussed plan with patient, she acknowledged understanding and agreed with plan of care.  Return precautions given for new or worsening symptoms.  Final Clinical Impressions(s) / ED Diagnoses   Final diagnoses:  Urinary frequency    New Prescriptions New Prescriptions   PHENAZOPYRIDINE (PYRIDIUM) 200 MG TABLET    Take 1 tablet (200 mg total) by mouth 3 (three) times daily.     Garlon HatchetLisa M Keondria Siever, PA-C 07/14/16 0606    Garlon HatchetLisa M Donielle Kaigler, PA-C 07/14/16 [id number]    Layla MawKristen N Ward, DO 07/14/16 0730

## 2016-07-18 ENCOUNTER — Telehealth: Payer: Self-pay | Admitting: Physician Assistant

## 2016-07-18 ENCOUNTER — Encounter: Payer: Self-pay | Admitting: Physician Assistant

## 2016-07-18 ENCOUNTER — Ambulatory Visit (INDEPENDENT_AMBULATORY_CARE_PROVIDER_SITE_OTHER): Payer: Medicaid Other | Admitting: Physician Assistant

## 2016-07-18 VITALS — BP 120/74 | HR 80 | Temp 98.2°F | Resp 18 | Ht 68.5 in | Wt 286.0 lb

## 2016-07-18 DIAGNOSIS — F332 Major depressive disorder, recurrent severe without psychotic features: Secondary | ICD-10-CM | POA: Diagnosis not present

## 2016-07-18 DIAGNOSIS — R102 Pelvic and perineal pain: Secondary | ICD-10-CM | POA: Diagnosis not present

## 2016-07-18 DIAGNOSIS — Z23 Encounter for immunization: Secondary | ICD-10-CM

## 2016-07-18 DIAGNOSIS — Z0000 Encounter for general adult medical examination without abnormal findings: Secondary | ICD-10-CM

## 2016-07-18 DIAGNOSIS — B9689 Other specified bacterial agents as the cause of diseases classified elsewhere: Secondary | ICD-10-CM

## 2016-07-18 DIAGNOSIS — N760 Acute vaginitis: Secondary | ICD-10-CM

## 2016-07-18 DIAGNOSIS — A499 Bacterial infection, unspecified: Secondary | ICD-10-CM

## 2016-07-18 DIAGNOSIS — N898 Other specified noninflammatory disorders of vagina: Secondary | ICD-10-CM | POA: Diagnosis not present

## 2016-07-18 LAB — CBC WITH DIFFERENTIAL/PLATELET
BASOS ABS: 0 {cells}/uL (ref 0–200)
Basophils Relative: 0 %
EOS PCT: 3 %
Eosinophils Absolute: 234 cells/uL (ref 15–500)
HEMATOCRIT: 40.1 % (ref 35.0–45.0)
HEMOGLOBIN: 13.3 g/dL (ref 12.0–15.0)
Lymphocytes Relative: 23 %
Lymphs Abs: 1794 cells/uL (ref 850–3900)
MCH: 27.6 pg (ref 27.0–33.0)
MCHC: 33.2 g/dL (ref 32.0–36.0)
MCV: 83.2 fL (ref 80.0–100.0)
MPV: 10.2 fL (ref 7.5–12.5)
Monocytes Absolute: 468 cells/uL (ref 200–950)
Monocytes Relative: 6 %
NEUTROS ABS: 5304 {cells}/uL (ref 1500–7800)
NEUTROS PCT: 68 %
Platelets: 174 10*3/uL (ref 140–400)
RBC: 4.82 MIL/uL (ref 3.80–5.10)
RDW: 13.8 % (ref 11.0–15.0)
WBC: 7.8 10*3/uL (ref 3.8–10.8)

## 2016-07-18 LAB — COMPLETE METABOLIC PANEL WITH GFR
ALBUMIN: 3.9 g/dL (ref 3.6–5.1)
ALK PHOS: 73 U/L (ref 33–115)
ALT: 34 U/L — ABNORMAL HIGH (ref 6–29)
AST: 15 U/L (ref 10–30)
BUN: 7 mg/dL (ref 7–25)
CALCIUM: 8.7 mg/dL (ref 8.6–10.2)
CO2: 25 mmol/L (ref 20–31)
Chloride: 104 mmol/L (ref 98–110)
Creat: 0.61 mg/dL (ref 0.50–1.10)
Glucose, Bld: 89 mg/dL (ref 70–99)
POTASSIUM: 4 mmol/L (ref 3.5–5.3)
Sodium: 139 mmol/L (ref 135–146)
Total Bilirubin: 0.4 mg/dL (ref 0.2–1.2)
Total Protein: 6.3 g/dL (ref 6.1–8.1)

## 2016-07-18 LAB — WET PREP FOR TRICH, YEAST, CLUE
Trich, Wet Prep: NONE SEEN
Yeast Wet Prep HPF POC: NONE SEEN

## 2016-07-18 LAB — LIPID PANEL
CHOL/HDL RATIO: 4.7 ratio (ref <=5.0)
CHOLESTEROL: 174 mg/dL (ref 125–200)
HDL: 37 mg/dL — AB (ref >=46)
LDL Cholesterol: 112 mg/dL (ref <130)
Triglycerides: 127 mg/dL (ref <150)
VLDL: 25 mg/dL (ref <30)

## 2016-07-18 LAB — TSH: TSH: 2.12 mIU/L

## 2016-07-18 MED ORDER — METRONIDAZOLE 500 MG PO TABS: 500.0000 mg | ORAL_TABLET | Freq: Two times a day (BID) | ORAL | 0 refills | Status: DC

## 2016-07-18 NOTE — Telephone Encounter (Signed)
Omeprazole 20 mg 1 by mouth daily when necessary #30+5 refills

## 2016-07-18 NOTE — Progress Notes (Signed)
Patient ID: Michele BorsDana D Oconnor MRN: [id number], DOB: Aug 07, 1981, 35 y.o. Date of Encounter: 07/18/2016,   Chief Complaint: Physical (CPE)  HPI: 35 y.o. y/o white female  here for CPE.   Also she reports that she went to the ER this past Wednesday because she was having urinary frequency. Says that they did a urine test and told her that she was not pregnant and that she did not have a urine infection. Says that they gave her some AZO and told her to follow-up with us. Says that she has not been noticing any vaginal irritation or vaginal discharge. No pelvic pain just some pressure sensation there and the urinary frequency.  Says that she is seeing a psychiatrist now as recommended at last visit here. Says it is called the neuropsychiatric care center and that it is on [facility] [facility] [address] [place]. Says she has been going there since January and that they do medicines and therapy/counseling.  She has not been seeing a gynecologist. Her last Pap smear was performed here 11/2014 and was negative HPV negative cytology.  No other complaints or concerns today.   Review of Systems: Consitutional: No fever, chills, fatigue, night sweats, lymphadenopathy. No significant/unexplained weight changes. Eyes: No visual changes, eye redness, or discharge. ENT/Mouth: No ear pain, sore throat, nasal drainage, or sinus pain. Cardiovascular: No chest pressure,heaviness, tightness or squeezing, even with exertion. No increased shortness of breath or dyspnea on exertion.No palpitations, edema, orthopnea, PND. Respiratory: No cough, hemoptysis, SOB, or wheezing. Gastrointestinal: No anorexia, dysphagia, reflux, pain, nausea, vomiting, hematemesis, diarrhea, constipation, BRBPR, or melena. Breast: No mass, nodules, bulging, or retraction. No skin changes or inflammation. No nipple discharge. No lymphadenopathy. Genitourinary: No dysuria, hematuria, incontinence, vaginal discharge, pruritis, burning,  abnormal bleeding, or pain. Musculoskeletal: No decreased ROM, No joint pain or swelling. No significant pain in neck, back, or extremities. Skin: No rash, pruritis, or concerning lesions. Neurological: No headache, dizziness, syncope, seizures, tremors, memory loss, coordination problems, or paresthesias. Psychological: No anxiety, depression, hallucinations, SI/HI. Endocrine: No polydipsia, polyphagia, polyuria, or known diabetes.No increased fatigue. No palpitations/rapid heart rate. No significant/unexplained weight change. All other systems were reviewed and are otherwise negative.  Past Medical History:  Diagnosis Date  . Anxiety   . Asthma   . Depression   . Ectopic pregnancy   . Irritable bowel syndrome (IBS) .     Past Surgical History:  Procedure Laterality Date  . TONSILLECTOMY    . TUBAL LIGATION      Home Meds:  Outpatient Medications Prior to Visit  Medication Sig Dispense Refill  . DULoxetine (CYMBALTA) 30 MG capsule Take 3 capsules (90 mg total) by mouth daily. For depression 90 capsule 0  . hydrOXYzine (ATARAX/VISTARIL) 25 MG tablet Take 1 tablet (25 mg total) by mouth every 6 (six) hours as needed for anxiety. 60 tablet 0  . phenazopyridine (PYRIDIUM) 200 MG tablet Take 1 tablet (200 mg total) by mouth 3 (three) times daily. (Patient not taking: Reported on 07/18/2016) 6 tablet 0  . traZODone (DESYREL) 150 MG tablet Take 1 tablet (150 mg total) by mouth at bedtime. For sleep (Patient not taking: Reported on 07/18/2016) 30 tablet 0  . amoxicillin (AMOXIL) 875 MG tablet Take 1 tablet (875 mg total) by mouth 2 (two) times daily. 20 tablet 0  . fluconazole (DIFLUCAN) 150 MG tablet Take 1 tablet (150 mg total) by mouth once. 1 tablet 0  . naproxen (NAPROSYN) 500 MG tablet Take 1  tablet (500 mg total) by mouth 2 (two) times daily. 20 tablet 0  . nicotine (NICODERM CQ - DOSED IN MG/24 HOURS) 21 mg/24hr patch Place 1 patch (21 mg total) onto the skin daily. For smoking  cessation (Patient not taking: Reported on 12/24/2015) 28 patch 0   No facility-administered medications prior to visit.     Allergies:  Allergies  Allergen Reactions  . Latex Rash    Reaction to gloves    Social History   Social History  . Marital status: Single    Spouse name: N/A  . Number of children: N/A  . Years of education: N/A   Occupational History  . Not on file.   Social History Main Topics  . Smoking status: Current Every Day Smoker    Packs/day: 0.50    Types: Cigarettes  . Smokeless tobacco: Never Used  . Alcohol use Yes     Comment: social  . Drug use:     Types: Marijuana  . Sexual activity: Yes    Birth control/ protection: None   Other Topics Concern  . Not on file   Social History Narrative  . No narrative on file    History reviewed. No pertinent family history.  Physical Exam: Blood pressure 120/74, pulse 80, temperature 98.2 F (36.8 C), temperature source Oral, resp. rate 18, height 5' 8.5" (1.74 m), weight 286 lb (129.7 kg), last menstrual period 06/26/2016., Body mass index is 42.85 kg/m. General: Obese WF. Appears in no acute distress. HEENT: Normocephalic, atraumatic. Conjunctiva pink, sclera non-icteric. Pupils 2 mm constricting to 1 mm, round, regular, and equally reactive to light and accomodation. EOMI. Internal auditory canal clear. TMs with good cone of light and without pathology. Nasal mucosa pink. Nares are without discharge. No sinus tenderness. Oral mucosa pink.  Pharynx without exudate.   Neck: Supple. Trachea midline. No thyromegaly. Full ROM. No lymphadenopathy.No Carotid Bruits. Lungs: Clear to auscultation bilaterally without wheezes, rales, or rhonchi. Breathing is of normal effort and unlabored. Cardiovascular: RRR with S1 S2. No murmurs, rubs, or gallops. Distal pulses 2+ symmetrically. No carotid or abdominal bruits. Breast: Symmetrical. No masses. Nipples without discharge. Abdomen: Soft, non-tender, non-distended  with normoactive bowel sounds. No hepatosplenomegaly or masses. No rebound/guarding. No CVA tenderness. No hernias.  Genitourinary:  External genitalia without lesions. Vaginal mucosa pink. Cervix pink and without discharge. No cervical tenderness.Normal uterus size. No adnexal mass or tenderness. Large amount of discharge present. No cervical motion tenderness.   Musculoskeletal: Full range of motion and 5/5 strength throughout.  Skin: Warm and moist without erythema, ecchymosis, wounds, or rash. Neuro: A+Ox3. CN II-XII grossly intact. Moves all extremities spontaneously. Full sensation throughout. Normal gait. DTR 2+ throughout upper and lower extremities. Finger to nose intact. Psych:  Responds to questions appropriately with a normal affect.   Assessment/Plan:  35 y.o. y/o female here for CPE  1. Encounter for preventive health examination  A. Screening Labs: - CBC with Differential/Platelet - COMPLETE METABOLIC PANEL WITH GFR - Lipid panel - TSH  B. Pap: Pap smear performed here 11/2014--- HPV negative. Cytology negative.  C. Screening Mammogram: Will start this at age 35.  D. DEXA/BMD:  Will discuss this at age 35.  E. Colorectal Cancer Screening: Will discuss this at age 35.  F. Immunizations:  Influenza:--------------------N/A Tetanus:-------------------- she reports that she has had no tetanus in > 10 years. Agreeable to update today. T dap given here 07/18/2016 Pneumococcal:------------ No indication to require this until age 35. Zostavax:------------------- will discuss  at age 61   2. Severe episode of recurrent major depressive disorder, without psychotic features (HCC) ----Managed by Psychiatry  3. Vaginal discharge - GC/Chlamydia Probe Amp - WET PREP FOR TRICH, YEAST, CLUE  4. Pelvic pain in female - GC/Chlamydia Probe Amp - WET PREP FOR TRICH, YEAST, CLUE  5. Need for diphtheria-tetanus-pertussis (Tdap) vaccine - Tdap vaccine greater than or equal to 7yo  IM  6. Bacterial vaginosis - metroNIDAZOLE (FLAGYL) 500 MG tablet; Take 1 tablet (500 mg total) by mouth 2 (two) times daily.  Dispense: 14 tablet; Refill: 0 Wet prep does show clue cells. Suspect that BV is the cause of her recent symptoms. She is to take the Flagyl as directed. Follow-up if symptoms do not resolve after completion of this.     [address] [place], [place], [facility] 07/18/2016 12:35 PM

## 2016-07-18 NOTE — Telephone Encounter (Signed)
Patient is calling because she forgot to mention that she is having acid reflux could you call something in for her? Pharmacy [facility]

## 2016-07-19 LAB — GC/CHLAMYDIA PROBE AMP
CT Probe RNA: NOT DETECTED
GC Probe RNA: NOT DETECTED

## 2016-07-19 MED ORDER — OMEPRAZOLE 20 MG PO CPDR: 20.0000 mg | DELAYED_RELEASE_CAPSULE | Freq: Every day | ORAL | 1 refills | Status: DC | PRN

## 2016-07-19 NOTE — Telephone Encounter (Signed)
Rx to pharmacy and pt made aware. 

## 2016-08-02 ENCOUNTER — Telehealth: Payer: Self-pay

## 2016-08-02 NOTE — Telephone Encounter (Signed)
Last OV 07-18-16 Pt  States she was treated for bacterial infection and now has a yeast infection which pt states she  normally gets after taking an antibiotic. Pt is asking if a prescription can be sent in for her yeast infection?

## 2016-08-02 NOTE — Telephone Encounter (Signed)
Returned pt call regarding an RX she wants called in. No answer left message for pt to call bck

## 2016-08-05 ENCOUNTER — Telehealth: Payer: Self-pay

## 2016-08-05 NOTE — Telephone Encounter (Signed)
Pt called for rx to be called in. lmtcb

## 2016-08-05 NOTE — Telephone Encounter (Signed)
Pt called in wanting antibiotic for yeast infection has been having symptoms for two weeks. Offered to have an appt sch for her patient stated she would get something OTC, then explained if symptoms worsened to go to Urgent care over the weekend.

## 2016-08-20 ENCOUNTER — Encounter (HOSPITAL_COMMUNITY): Payer: Self-pay | Admitting: Emergency Medicine

## 2016-08-20 ENCOUNTER — Ambulatory Visit (HOSPITAL_COMMUNITY)
Admission: EM | Admit: 2016-08-20 | Discharge: 2016-08-20 | Disposition: A | Discharge status: Home / Self Care | Payer: Medicaid Other | Attending: Internal Medicine | Admitting: Internal Medicine

## 2016-08-20 DIAGNOSIS — M940 Chondrocostal junction syndrome [Tietze]: Secondary | ICD-10-CM

## 2016-08-20 DIAGNOSIS — R0789 Other chest pain: Secondary | ICD-10-CM | POA: Diagnosis not present

## 2016-08-20 MED ORDER — NAPROXEN 375 MG PO TABS: 375.0000 mg | ORAL_TABLET | Freq: Two times a day (BID) | ORAL | 0 refills | Status: DC

## 2016-08-20 NOTE — Discharge Instructions (Signed)
Apply ice to sore areas, Limit movements that make pain worse. This may last a few weeks.

## 2016-08-20 NOTE — ED Triage Notes (Signed)
Pt c/o intermittent CP onset 2 weeks associated w/diaphoresis... Reports pain will radiate to right side  Sates CP increases w/prod cough   Smokes 0.5 ppd   Also reports external stress factors: new job and going to school  Hx of anxiety  A&O x4... NAD

## 2016-08-20 NOTE — ED Provider Notes (Signed)
CSN: [id number]     Arrival date & time 08/20/16  1600 History   First MD Initiated Contact with Patient 08/20/16 1817     Chief Complaint  Patient presents with  . Chest Pain   (Consider location/radiation/quality/duration/timing/severity/associated sxs/prior Treatment) 35 year old female complaining of chest pain for 2 weeks. Pain is located parasternally and to the left upper anterior chest. Pain is exacerbated or elicited by cough, movement and the act of lying down or sitting up. She states that the cough just developed yesterday and has only rare to occasional. Denies fever, chills, shortness of breath, earache or sore throat.      Past Medical History:  Diagnosis Date  . Anxiety   . Asthma   . Depression   . Ectopic pregnancy   . Irritable bowel syndrome (IBS) .   Past Surgical History:  Procedure Laterality Date  . TONSILLECTOMY    . TUBAL LIGATION     History reviewed. No pertinent family history. Social History  Substance Use Topics  . Smoking status: Current Every Day Smoker    Packs/day: 0.50    Types: Cigarettes  . Smokeless tobacco: Never Used  . Alcohol use Yes     Comment: social   OB History    No data available     Review of Systems  Constitutional: Negative.   HENT: Positive for postnasal drip.   Respiratory: Positive for cough. Negative for chest tightness, shortness of breath and wheezing.   Cardiovascular: Positive for chest pain. Negative for leg swelling.  Gastrointestinal: Negative.   Musculoskeletal: Negative.   Neurological: Negative.   All other systems reviewed and are negative.   Allergies  Latex  Home Medications   Prior to Admission medications   Medication Sig Start Date End Date Taking? Authorizing Provider  busPIRone (BUSPAR) 15 MG tablet TAKE 1 TABLET TWICE A DAY AS NEEDED FOR ANXIETY 06/17/16  Yes Historical Provider, MD  DULoxetine (CYMBALTA) 30 MG capsule Take 3 capsules (90 mg total) by mouth daily. For depression  11/19/15  Yes Sanjuana KavaAgnes I Nwoko, NP  Cranberry-Vitamin C (AZO CRANBERRY URINARY TRACT PO) Take 2 tablets by mouth daily.    Historical Provider, MD  hydrOXYzine (ATARAX/VISTARIL) 25 MG tablet Take 1 tablet (25 mg total) by mouth every 6 (six) hours as needed for anxiety. 11/19/15   Sanjuana KavaAgnes I Nwoko, NP  metroNIDAZOLE (FLAGYL) 500 MG tablet Take 1 tablet (500 mg total) by mouth 2 (two) times daily. 07/18/16   Patriciaann ClanMary B Dixon, PA-C  naproxen (NAPROSYN) 375 MG tablet Take 1 tablet (375 mg total) by mouth 2 (two) times daily. 08/20/16   Hayden Rasmussenavid Ilona Colley, NP  omeprazole (PRILOSEC) 20 MG capsule Take 1 capsule (20 mg total) by mouth daily as needed. 07/19/16   Patriciaann ClanMary B Dixon, PA-C  phenazopyridine (PYRIDIUM) 200 MG tablet Take 1 tablet (200 mg total) by mouth 3 (three) times daily. Patient not taking: Reported on 08/20/2016 07/14/16   Garlon HatchetLisa M Sanders, PA-C  traZODone (DESYREL) 150 MG tablet Take 1 tablet (150 mg total) by mouth at bedtime. For sleep Patient not taking: Reported on 08/20/2016 11/19/15   Sanjuana KavaAgnes I Nwoko, NP   Meds Ordered and Administered this Visit  Medications - No data to display  BP 118/84 (BP Location: Left Arm)   Pulse 80   Temp 98 F (36.7 C) (Oral)   Resp 18   SpO2 97%  No data found.   Physical Exam  Constitutional: She is oriented to person, place, and time. She appears  well-developed and well-nourished. No distress.  HENT:  Head: Normocephalic and atraumatic.  Oropharynx with mild to moderate amount of clear PND and cobblestoning. No exudates.  Eyes: EOM are normal.  Neck: Normal range of motion. Neck supple.  Cardiovascular: Normal rate, regular rhythm, normal heart sounds and intact distal pulses.   No murmur heard. Pulmonary/Chest: Effort normal and breath sounds normal. She exhibits tenderness.  Mid parasternal chest wall tenderness, tenderness to the right lower costal margin.  Musculoskeletal: Normal range of motion.  Neurological: She is alert and oriented to person, place, and  time.  Skin: Skin is warm and dry.  Nursing note and vitals reviewed.   Urgent Care Course   Clinical Course    Procedures (including critical care time)  Labs Review Labs Reviewed - No data to display  Imaging Review No results found.   Visual Acuity Review  Right Eye Distance:   Left Eye Distance:   Bilateral Distance:    Right Eye Near:   Left Eye Near:    Bilateral Near:         MDM   1. Chest wall pain   2. Costochondritis    Apply ice to sore areas, Limit movements that make pain worse. This may last a few weeks. Meds ordered this encounter  Medications  . naproxen (NAPROSYN) 375 MG tablet    Sig: Take 1 tablet (375 mg total) by mouth 2 (two) times daily.    Dispense:  20 tablet    Refill:  0    Order Specific Question:   Supervising Provider    Answer:   Eustace Moore [[id number]]       Hayden Rasmussen, NP 08/20/16 1836

## 2016-09-10 ENCOUNTER — Other Ambulatory Visit: Payer: Self-pay | Admitting: Physician Assistant

## 2016-09-10 DIAGNOSIS — N760 Acute vaginitis: Principal | ICD-10-CM

## 2016-09-10 DIAGNOSIS — B9689 Other specified bacterial agents as the cause of diseases classified elsewhere: Secondary | ICD-10-CM

## 2016-09-15 ENCOUNTER — Encounter: Payer: Self-pay | Admitting: Physician Assistant

## 2016-09-15 ENCOUNTER — Ambulatory Visit (INDEPENDENT_AMBULATORY_CARE_PROVIDER_SITE_OTHER): Payer: Medicaid Other | Admitting: Physician Assistant

## 2016-09-15 VITALS — BP 118/76 | HR 88 | Temp 98.5°F | Resp 18 | Ht 68.5 in | Wt 291.0 lb

## 2016-09-15 DIAGNOSIS — B372 Candidiasis of skin and nail: Secondary | ICD-10-CM

## 2016-09-15 MED ORDER — NYSTATIN 100000 UNIT/GM EX CREA: 1.0000 "application " | TOPICAL_CREAM | Freq: Two times a day (BID) | CUTANEOUS | 2 refills | Status: DC

## 2016-09-15 NOTE — Progress Notes (Signed)
Patient ID: [name] MRN: [id number], DOB: 07-25-1981, 35 y.o. Date of Encounter: 09/15/2016, 12:53 PM    Chief Complaint:  Chief Complaint  Patient presents with  . Rash    yeasty rash to inner thighs     HPI: 35 y.o. year old obese white female presents with above.   Says that she has actually seen me for this problem in the past and I have given her some cream in the past and it had resolved. However is now out of her cream. Also says that she remembers me telling her that this is caused from moisture on the skin. Says that after her shower she make sure to dry the skin well and even uses of fan to help get the skin completely dry there. Even with these measures has recurrent rash in the groin creases and skin folds. No other complaints or concerns today.     Home Meds:   Outpatient Medications Prior to Visit  Medication Sig Dispense Refill  . busPIRone (BUSPAR) 15 MG tablet TAKE 1 TABLET TWICE A DAY AS NEEDED FOR ANXIETY  1  . DULoxetine (CYMBALTA) 30 MG capsule Take 3 capsules (90 mg total) by mouth daily. For depression 90 capsule 0  . hydrOXYzine (ATARAX/VISTARIL) 25 MG tablet Take 1 tablet (25 mg total) by mouth every 6 (six) hours as needed for anxiety. 60 tablet 0  . naproxen (NAPROSYN) 375 MG tablet Take 1 tablet (375 mg total) by mouth 2 (two) times daily. 20 tablet 0  . omeprazole (PRILOSEC) 20 MG capsule Take 1 capsule (20 mg total) by mouth daily as needed. 90 capsule 1  . traZODone (DESYREL) 150 MG tablet Take 1 tablet (150 mg total) by mouth at bedtime. For sleep 30 tablet 0  . Cranberry-Vitamin C (AZO CRANBERRY URINARY TRACT PO) Take 2 tablets by mouth daily.    . metroNIDAZOLE (FLAGYL) 500 MG tablet Take 1 tablet (500 mg total) by mouth 2 (two) times daily. (Patient not taking: Reported on 09/15/2016) 14 tablet 0  . phenazopyridine (PYRIDIUM) 200 MG tablet Take 1 tablet (200 mg total) by mouth 3 (three) times daily. (Patient not taking: Reported on 09/15/2016)  6 tablet 0   No facility-administered medications prior to visit.     Allergies:  Allergies  Allergen Reactions  . Latex Rash    Reaction to gloves      Review of Systems: See HPI for pertinent ROS. All other ROS negative.    Physical Exam: Blood pressure 118/76, pulse 88, temperature 98.5 F (36.9 C), temperature source Oral, resp. rate 18, height 5' 8.5" (1.74 m), weight 291 lb (132 kg)., Body mass index is 43.6 kg/m. General:  Obese white female Appears in no acute distress. Neck: Supple. No thyromegaly. No lymphadenopathy. Lungs: Clear bilaterally to auscultation without wheezes, rales, or rhonchi. Breathing is unlabored. Heart: Regular rhythm. No murmurs, rubs, or gallops. Msk:  Strength and tone normal for age. Skin: There is approximate 1 inch diameter area in each groin crease, bilaterally. This area is diffuse light pink. Located in the skin folds of groin creases. Neuro: Alert and oriented X 3. Moves all extremities spontaneously. Gait is normal. CNII-XII grossly in tact. Psych:  Responds to questions appropriately with a normal affect.     ASSESSMENT AND PLAN:  35 y.o. year old female with  1. Cutaneous candidiasis She is to continue drying the skin and avoiding moisture in the skin folds as best as possible. Can apply the nystatin  cream to the affected areas. - nystatin cream (MYCOSTATIN); Apply 1 application topically 2 (two) times daily.  Dispense: 30 g; Refill: 2   Signed, [address] Beth PelzerDixon, [place], [facility] 09/15/2016 12:53 PM

## 2016-12-16 ENCOUNTER — Emergency Department (HOSPITAL_COMMUNITY)
Admission: EM | Admit: 2016-12-16 | Discharge: 2016-12-16 | Disposition: A | Discharge status: Home / Self Care | Payer: Medicaid Other | Attending: Emergency Medicine | Admitting: Emergency Medicine

## 2016-12-16 ENCOUNTER — Emergency Department (HOSPITAL_COMMUNITY): Payer: Medicaid Other

## 2016-12-16 ENCOUNTER — Other Ambulatory Visit: Payer: Self-pay

## 2016-12-16 ENCOUNTER — Encounter (HOSPITAL_COMMUNITY): Payer: Self-pay | Admitting: *Deleted

## 2016-12-16 DIAGNOSIS — Y92410 Unspecified street and highway as the place of occurrence of the external cause: Secondary | ICD-10-CM | POA: Insufficient documentation

## 2016-12-16 DIAGNOSIS — R0789 Other chest pain: Secondary | ICD-10-CM | POA: Diagnosis not present

## 2016-12-16 DIAGNOSIS — Y999 Unspecified external cause status: Secondary | ICD-10-CM | POA: Diagnosis not present

## 2016-12-16 DIAGNOSIS — F17210 Nicotine dependence, cigarettes, uncomplicated: Secondary | ICD-10-CM | POA: Diagnosis not present

## 2016-12-16 DIAGNOSIS — J45909 Unspecified asthma, uncomplicated: Secondary | ICD-10-CM | POA: Diagnosis not present

## 2016-12-16 DIAGNOSIS — Y939 Activity, unspecified: Secondary | ICD-10-CM | POA: Insufficient documentation

## 2016-12-16 DIAGNOSIS — R079 Chest pain, unspecified: Secondary | ICD-10-CM

## 2016-12-16 DIAGNOSIS — R0781 Pleurodynia: Secondary | ICD-10-CM | POA: Diagnosis present

## 2016-12-16 HISTORY — DX: Post-traumatic stress disorder, unspecified: F43.10

## 2016-12-16 LAB — I-STAT TROPONIN, ED
Troponin i, poc: 0 ng/mL (ref 0.00–0.08)
Troponin i, poc: 0 ng/mL (ref 0.00–0.08)

## 2016-12-16 LAB — BASIC METABOLIC PANEL
Anion gap: 8 (ref 5–15)
BUN: 10 mg/dL (ref 6–20)
CALCIUM: 8.9 mg/dL (ref 8.9–10.3)
CO2: 24 mmol/L (ref 22–32)
CREATININE: 0.72 mg/dL (ref 0.44–1.00)
Chloride: 107 mmol/L (ref 101–111)
GFR calc Af Amer: >60 mL/min (ref >60)
GLUCOSE: 100 mg/dL — AB (ref 65–99)
POTASSIUM: 3.9 mmol/L (ref 3.5–5.1)
SODIUM: 139 mmol/L (ref 135–145)

## 2016-12-16 LAB — I-STAT BETA HCG BLOOD, ED (MC, WL, AP ONLY)

## 2016-12-16 LAB — CBC
HEMATOCRIT: 39.6 % (ref 36.0–46.0)
Hemoglobin: 13 g/dL (ref 12.0–15.0)
MCH: 27.6 pg (ref 26.0–34.0)
MCHC: 32.8 g/dL (ref 30.0–36.0)
MCV: 84.1 fL (ref 78.0–100.0)
PLATELETS: 182 10*3/uL (ref 150–400)
RBC: 4.71 MIL/uL (ref 3.87–5.11)
RDW: 13.2 % (ref 11.5–15.5)
WBC: 9.2 10*3/uL (ref 4.0–10.5)

## 2016-12-16 MED ORDER — GI COCKTAIL ~~LOC~~
30.0000 mL | Freq: Once | ORAL | Status: AC
  Administered 2016-12-16: 30 mL via ORAL
  Filled 2016-12-16: qty 30
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
## 2016-12-16 NOTE — ED Notes (Signed)
ED Provider at bedside. 

## 2016-12-16 NOTE — Discharge Instructions (Signed)
If you were given medicines take as directed.  If you are on coumadin or contraceptives realize their levels and effectiveness is altered by many different medicines.  If you have any reaction (rash, tongues swelling, other) to the medicines stop taking and see a physician.    If your blood pressure was elevated in the ER make sure you follow up for management with a primary doctor or return for chest pain, shortness of breath or stroke symptoms.  Please follow up as directed and return to the ER or see a physician for new or worsening symptoms.  Thank you. Vitals:   12/16/16 2235 12/16/16 2236 12/16/16 2237 12/16/16 2238  BP:      Pulse: 74 71 76 72  Resp: 18 20 18 16   Temp:      TempSrc:      SpO2: 99% 99% 100% 98%  Weight:      Height:

## 2017-06-08 ENCOUNTER — Encounter: Payer: Self-pay | Admitting: Physician Assistant

## 2017-06-08 ENCOUNTER — Ambulatory Visit (INDEPENDENT_AMBULATORY_CARE_PROVIDER_SITE_OTHER): Payer: Medicaid Other | Admitting: Physician Assistant

## 2017-06-08 VITALS — BP 120/78 | HR 81 | Temp 98.0°F | Resp 16 | Ht 68.0 in | Wt 304.0 lb

## 2017-06-08 DIAGNOSIS — K219 Gastro-esophageal reflux disease without esophagitis: Secondary | ICD-10-CM | POA: Diagnosis not present

## 2017-06-08 DIAGNOSIS — B372 Candidiasis of skin and nail: Secondary | ICD-10-CM

## 2017-06-08 MED ORDER — OMEPRAZOLE 20 MG PO CPDR: 20.0000 mg | DELAYED_RELEASE_CAPSULE | Freq: Every day | ORAL | 5 refills | Status: DC | PRN

## 2017-06-08 MED ORDER — NYSTATIN 100000 UNIT/GM EX CREA: 1.0000 "application " | TOPICAL_CREAM | Freq: Two times a day (BID) | CUTANEOUS | 2 refills | Status: DC

## 2017-06-08 MED ORDER — FLUCONAZOLE 150 MG PO TABS: 150.0000 mg | ORAL_TABLET | Freq: Once | ORAL | 0 refills | Status: AC

## 2017-06-08 NOTE — Progress Notes (Signed)
Patient ID: [name] MRN: [id number], DOB: 02/14/1981, 36 y.o. Date of Encounter: 06/08/2017, 9:28 AM    Chief Complaint:  Chief Complaint  Patient presents with  . Rash    on going inner left and right thighs      HPI: 36 y.o. year old female presents with above.   She says that she has had this problem/rash in the past and has had to have OV for this remotely. However, more recently she has just been trying to treat this herself with different over-the-counter creams etc. and has been reading things on the computer for home remedies.  Also is asking for refill on her Prilosec. Says that recently she has been burping and sometimes can taste acid coming up and feels that she needs to be get back on Prilosec.     Home Meds:   Outpatient Medications Prior to Visit  Medication Sig Dispense Refill  . buPROPion (WELLBUTRIN XL) 150 MG 24 hr tablet Take 150 mg by mouth daily.    . busPIRone (BUSPAR) 15 MG tablet TAKE 1 TABLET TWICE A DAY AS NEEDED FOR ANXIETY  1  . Cranberry-Vitamin C (AZO CRANBERRY URINARY TRACT PO) Take 2 tablets by mouth daily.    . DULoxetine (CYMBALTA) 30 MG capsule Take 3 capsules (90 mg total) by mouth daily. For depression 90 capsule 0  . hydrOXYzine (ATARAX/VISTARIL) 25 MG tablet Take 1 tablet (25 mg total) by mouth every 6 (six) hours as needed for anxiety. 60 tablet 0  . naproxen (NAPROSYN) 375 MG tablet Take 1 tablet (375 mg total) by mouth 2 (two) times daily. 20 tablet 0  . nystatin cream (MYCOSTATIN) Apply 1 application topically 2 (two) times daily. 30 g 2  . omeprazole (PRILOSEC) 20 MG capsule Take 1 capsule (20 mg total) by mouth daily as needed. 90 capsule 1  . traZODone (DESYREL) 150 MG tablet Take 1 tablet (150 mg total) by mouth at bedtime. For sleep 30 tablet 0   No facility-administered medications prior to visit.     Allergies:  Allergies  Allergen Reactions  . Latex Rash    Reaction to gloves      Review of Systems: See HPI for  pertinent ROS. All other ROS negative.    Physical Exam: Blood pressure 120/78, pulse 81, temperature 98 F (36.7 C), temperature source Oral, resp. rate 16, height 5\' 8"  (1.727 m), weight (!) 304 lb (137.9 kg), last menstrual period 05/24/2017, SpO2 98 %., Body mass index is 46.22 kg/m. General:  Obese WF. Appears in no acute distress. Neck: Supple. No thyromegaly. No lymphadenopathy. Lungs: Clear bilaterally to auscultation without wheezes, rales, or rhonchi. Breathing is unlabored. Heart: Regular rhythm. No murmurs, rubs, or gallops. Abdomen: Soft, non-tender, non-distended with normoactive bowel sounds. No hepatomegaly. No rebound/guarding. No obvious abdominal masses. Msk:  Strength and tone normal for age. Skin: Skin folds/skin creases in groin bilaterally with diffuse pink erythema.  Neuro: Alert and oriented X 3. Moves all extremities spontaneously. Gait is normal. CNII-XII grossly in tact. Psych:  Responds to questions appropriately with a normal affect.     ASSESSMENT AND PLAN:  36 y.o. year old female with   1. Candidiasis of skin Discussed that this is caused by moisture. Discussed making sure that she tries in these creases very well after bathing. Also if she feels that she is sweaty etc. needs to get this dry. Also says that in the past, she was given 1 Diflucan to take to  help get rid of this and it really helped. Is asking if I can send in 1 Diflucan to "jump start this ". - nystatin cream (MYCOSTATIN); Apply 1 application topically 2 (two) times daily.  Dispense: 30 g; Refill: 2 - fluconazole (DIFLUCAN) 150 MG tablet; Take 1 tablet (150 mg total) by mouth once.  Dispense: 1 tablet; Refill: 0   2. Gastroesophageal reflux disease, esophagitis presence not specified - omeprazole (PRILOSEC) 20 MG capsule; Take 1 capsule (20 mg total) by mouth daily as needed.  Dispense: 30 capsule; Refill: [address]   Signed, Bell Carbo Beth EllerslieDixon, [place], [facility] 06/08/2017 9:28 AM

## 2017-07-20 ENCOUNTER — Ambulatory Visit (INDEPENDENT_AMBULATORY_CARE_PROVIDER_SITE_OTHER): Payer: Medicaid Other | Admitting: Family Medicine

## 2017-07-20 ENCOUNTER — Encounter: Payer: Medicaid Other | Admitting: Physician Assistant

## 2017-07-20 ENCOUNTER — Encounter: Payer: Self-pay | Admitting: Family Medicine

## 2017-07-20 VITALS — BP 140/80 | HR 88 | Temp 98.7°F | Resp 18 | Ht 68.0 in | Wt 306.0 lb

## 2017-07-20 DIAGNOSIS — F321 Major depressive disorder, single episode, moderate: Secondary | ICD-10-CM

## 2017-07-20 MED ORDER — VORTIOXETINE HBR 10 MG PO TABS: 10.0000 mg | ORAL_TABLET | Freq: Every day | ORAL | 5 refills | Status: DC

## 2017-07-20 NOTE — Progress Notes (Signed)
Subjective:    Patient ID: [name], female    DOB: [birth year], 36 y.o.   MRN: [id number]  HPI Patient has previously been seen my partner. She was originally scheduled today for complete physical exam. However the physical exam today was deferred as we spent more than 30 minutes discussing her major concern which is overwhelming depression. She has a long-standing history of depression since she was a teenager. Possibly 4 months ago, the patient was taking Cymbalta which was working fair managing her depression that was causing severe depression of her libido. Therefore her psychiatrist switched her to a combination of Zoloft and Wellbutrin. Since that time, the depression has spiraled out of control. She has no desire. She has no motivation. She reports complete anhedonia. She reports depression. She reports constant anxiety. She is afraid to leave the home. She is not exercising or performing any task around the home due to lack of motivation. She is eating poorly. Today she is tearful throughout the entire encounter. She denies any suicidal thoughts or actions. She denies any hallucinations or delusions. Past Medical History:  Diagnosis Date  . Anxiety   . Asthma   . Depression   . Ectopic pregnancy   . Irritable bowel syndrome (IBS) .  Marland Kitchen PTSD (post-traumatic stress disorder)    Past Surgical History:  Procedure Laterality Date  . TONSILLECTOMY    . TUBAL LIGATION     Current Outpatient Prescriptions on File Prior to Visit  Medication Sig Dispense Refill  . buPROPion (WELLBUTRIN XL) 150 MG 24 hr tablet Take 150 mg by mouth daily.    Marland Kitchen nystatin cream (MYCOSTATIN) Apply 1 application topically 2 (two) times daily. 30 g 2  . omeprazole (PRILOSEC) 20 MG capsule Take 1 capsule (20 mg total) by mouth daily as needed. 30 capsule 5  . sertraline (ZOLOFT) 50 MG tablet Take 50 mg by mouth daily as needed.  1   No current facility-administered medications on file prior to visit.     Allergies  Allergen Reactions  . Latex Rash    Reaction to gloves   Social History   Social History  . Marital status: Single    Spouse name: N/A  . Number of children: N/A  . Years of education: N/A   Occupational History  . Not on file.   Social History Main Topics  . Smoking status: Former Smoker    Packs/day: 0.50    Types: Cigarettes  . Smokeless tobacco: Never Used  . Alcohol use Yes     Comment: social  . Drug use: Yes    Types: Marijuana  . Sexual activity: Yes    Birth control/ protection: None   Other Topics Concern  . Not on file   Social History Narrative  . No narrative on file      Review of Systems  All other systems reviewed and are negative.      Objective:   Physical Exam  Cardiovascular: Normal rate and regular rhythm.   Pulmonary/Chest: Effort normal and breath sounds normal.  Psychiatric: Her speech is normal and behavior is normal. Judgment and thought content normal. Her mood appears anxious. Cognition and memory are normal. She exhibits a depressed mood.  Vitals reviewed.         Assessment & Plan:  Depression, major, single episode, moderate (HCC)  Physical exam today was deferred to a later date. We need to get her depression under control. She is tried and failed Zoloft, Wellbutrin, and  Cymbalta. I will continue the patient on Wellbutrin. I will discontinue Zoloft. I will start the patient on Trintellix milligrams a day and then increase to 10 mg a day in one week. I will like to see the patient back in 4 weeks. I have begged the patient to exercise 5 minutes every day just to improve self-esteem, improve motivation, and work on energy. I've also asked her to begin working on diet and try to start eating healthier limiting her consumption of bread and soda and trying to eat more fruits and vegetables. Recheck in 4-6 weeks.

## 2017-07-21 ENCOUNTER — Telehealth: Payer: Self-pay | Admitting: Family Medicine

## 2017-07-21 DIAGNOSIS — F321 Major depressive disorder, single episode, moderate: Secondary | ICD-10-CM

## 2017-07-21 MED ORDER — VORTIOXETINE HBR 10 MG PO TABS: 10.0000 mg | ORAL_TABLET | Freq: Every day | ORAL | 5 refills | Status: DC

## 2017-07-21 NOTE — Telephone Encounter (Signed)
PA submitted through [employer] and approved. Pharm aware.

## 2017-08-17 ENCOUNTER — Encounter: Payer: Self-pay | Admitting: Family Medicine

## 2017-08-17 ENCOUNTER — Ambulatory Visit (INDEPENDENT_AMBULATORY_CARE_PROVIDER_SITE_OTHER): Payer: Medicaid Other | Admitting: Family Medicine

## 2017-08-17 VITALS — BP 138/78 | HR 79 | Temp 98.6°F | Resp 16 | Wt 306.0 lb

## 2017-08-17 DIAGNOSIS — F321 Major depressive disorder, single episode, moderate: Secondary | ICD-10-CM

## 2017-08-17 MED ORDER — BUPROPION HCL ER (XL) 150 MG PO TB24: ORAL_TABLET | ORAL | 1 refills | Status: DC

## 2017-08-17 NOTE — Progress Notes (Signed)
Subjective:    Patient ID: [name], female    DOB: 10-18-1981, 36 y.o.   MRN: [id number]  HPI  07/20/17 Patient has previously been seen my partner. She was originally scheduled today for complete physical exam. However the physical exam today was deferred as we spent more than 30 minutes discussing her major concern which is overwhelming depression. She has a long-standing history of depression since she was a teenager. Possibly 4 months ago, the patient was taking Cymbalta which was working fair managing her depression that was causing severe depression of her libido. Therefore her psychiatrist switched her to a combination of Zoloft and Wellbutrin. Since that time, the depression has spiraled out of control. She has no desire. She has no motivation. She reports complete anhedonia. She reports depression. She reports constant anxiety. She is afraid to leave the home. She is not exercising or performing any task around the home due to lack of motivation. She is eating poorly. Today she is tearful throughout the entire encounter. She denies any suicidal thoughts or actions. She denies any hallucinations or delusions.  At that time, my plan was: Physical exam today was deferred to a later date. We need to get her depression under control. She is tried and failed Zoloft, Wellbutrin, and Cymbalta. I will continue the patient on Wellbutrin. I will discontinue Zoloft. I will start the patient on Trintellix milligrams a day and then increase to 10 mg a day in one week. I will like to see the patient back in 4 weeks. I have begged the patient to exercise 5 minutes every day just to improve self-esteem, improve motivation, and work on energy. I've also asked her to begin working on diet and try to start eating healthier limiting her consumption of bread and soda and trying to eat more fruits and vegetables. Recheck in 4-6 weeks.  08/17/17 Patient feels that she is doing substantially better since starting  trintellix.  She states that she is not 100% better but she's noticed major improvement. She denies any side effects from the medication at this time. She is not exercising as we discussed however she continues to refrain from abusing alcohol or marijuana. She denies any symptoms of mania. She denies any insomnia. She is questioning whether she needs to stick with Wellbutrin because she is out of refills Past Medical History:  Diagnosis Date  . Anxiety   . Asthma   . Depression   . Ectopic pregnancy   . Irritable bowel syndrome (IBS) .  Marland Kitchen PTSD (post-traumatic stress disorder)    Past Surgical History:  Procedure Laterality Date  . TONSILLECTOMY    . TUBAL LIGATION     Current Outpatient Prescriptions on File Prior to Visit  Medication Sig Dispense Refill  . nystatin cream (MYCOSTATIN) Apply 1 application topically 2 (two) times daily. 30 g 2  . omeprazole (PRILOSEC) 20 MG capsule Take 1 capsule (20 mg total) by mouth daily as needed. 30 capsule 5  . vortioxetine HBr (TRINTELLIX) 10 MG TABS Take 1 tablet (10 mg total) by mouth daily. 30 tablet 5   No current facility-administered medications on file prior to visit.    Allergies  Allergen Reactions  . Latex Rash    Reaction to gloves   Social History   Social History  . Marital status: Single    Spouse name: N/A  . Number of children: N/A  . Years of education: N/A   Occupational History  . Not on file.  Social History Main Topics  . Smoking status: Former Smoker    Packs/day: 0.50    Types: Cigarettes  . Smokeless tobacco: Never Used  . Alcohol use Yes     Comment: social  . Drug use: Yes    Types: Marijuana  . Sexual activity: Yes    Birth control/ protection: None   Other Topics Concern  . Not on file   Social History Narrative  . No narrative on file      Review of Systems  Psychiatric/Behavioral: Positive for depression.  All other systems reviewed and are negative.      Objective:   Physical  Exam  Cardiovascular: Normal rate and regular rhythm.   Pulmonary/Chest: Effort normal and breath sounds normal.  Psychiatric: Her speech is normal and behavior is normal. Judgment and thought content normal. Her mood appears not anxious. Cognition and memory are normal. She does not exhibit a depressed mood.  Vitals reviewed.         Assessment & Plan:  Depression, major, single episode, moderate (HCC)  Based on our interaction today, the patient seems much improved. She is eating smiling and laughing on today's encounter. Therefore I will continue the medication for an additional 4 weeks and then reassess. I would like her to continue the Wellbutrin at this time. However I would consider weaning her off the medication at her follow-up in 4 weeks if she continues to show improvement on Trintellix.

## 2017-09-15 ENCOUNTER — Ambulatory Visit (INDEPENDENT_AMBULATORY_CARE_PROVIDER_SITE_OTHER): Payer: Medicaid Other | Admitting: Family Medicine

## 2017-09-15 ENCOUNTER — Encounter: Payer: Self-pay | Admitting: Family Medicine

## 2017-09-15 VITALS — BP 110/88 | HR 82 | Temp 98.2°F | Resp 18 | Ht 68.0 in | Wt 311.0 lb

## 2017-09-15 DIAGNOSIS — F321 Major depressive disorder, single episode, moderate: Secondary | ICD-10-CM

## 2017-09-15 MED ORDER — LAMOTRIGINE 25 MG PO TABS: 50.0000 mg | ORAL_TABLET | Freq: Every day | ORAL | 4 refills | Status: DC

## 2017-09-15 MED ORDER — BUPROPION HCL 75 MG PO TABS: 75.0000 mg | ORAL_TABLET | Freq: Every day | ORAL | 0 refills | Status: DC

## 2017-09-15 NOTE — Progress Notes (Signed)
Subjective:    Patient ID: [name], female    DOB: 1981-06-22, 36 y.o.   MRN: [id number]  Depression          07/20/17 Patient has previously been seen my partner. She was originally scheduled today for complete physical exam. However the physical exam today was deferred as we spent more than 30 minutes discussing her major concern which is overwhelming depression. She has a long-standing history of depression since she was a teenager. Possibly 4 months ago, the patient was taking Cymbalta which was working fair managing her depression that was causing severe depression of her libido. Therefore her psychiatrist switched her to a combination of Zoloft and Wellbutrin. Since that time, the depression has spiraled out of control. She has no desire. She has no motivation. She reports complete anhedonia. She reports depression. She reports constant anxiety. She is afraid to leave the home. She is not exercising or performing any task around the home due to lack of motivation. She is eating poorly. Today she is tearful throughout the entire encounter. She denies any suicidal thoughts or actions. She denies any hallucinations or delusions.  At that time, my plan was: Physical exam today was deferred to a later date. We need to get her depression under control. She is tried and failed Zoloft, Wellbutrin, and Cymbalta. I will continue the patient on Wellbutrin. I will discontinue Zoloft. I will start the patient on Trintellix milligrams a day and then increase to 10 mg a day in one week. I will like to see the patient back in 4 weeks. I have begged the patient to exercise 5 minutes every day just to improve self-esteem, improve motivation, and work on energy. I've also asked her to begin working on diet and try to start eating healthier limiting her consumption of bread and soda and trying to eat more fruits and vegetables. Recheck in 4-6 weeks.  08/17/17 Patient feels that she is doing substantially better  since starting trintellix.  She states that she is not 100% better but she's noticed major improvement. She denies any side effects from the medication at this time. She is not exercising as we discussed however she continues to refrain from abusing alcohol or marijuana. She denies any symptoms of mania. She denies any insomnia. She is questioning whether she needs to stick with Wellbutrin because she is out of refills.  At that time, my plan was: Based on our interaction today, the patient seems much improved. She is eating smiling and laughing on today's encounter. Therefore I will continue the medication for an additional 4 weeks and then reassess. I would like her to continue the Wellbutrin at this time. However I would consider weaning her off the medication at her follow-up in 4 weeks if she continues to show improvement on Trintellix.    09/15/17 Patient reports severe nausea on trintellix 10 mg a day.  Her depression seems better. She recently celebrated a birthday of her son who is deceased and she was not etc. Is tearful as she has been in the past. However she is continuing to have mood swings. She is kicked out her boyfriend twice since I last saw her without good reason. During our entire encounter today, she appears to have flight of ideas. It is hard to follow her during our conversation. Frequently while talking, she will interrupt me and discuss something completely unrelated. She will often ask me a question while in trying to answer it, she will change  the subject and talk about something else unrelated. At one moment she is laughing. The next moment she is crying. Past Medical History:  Diagnosis Date  . Anxiety   . Asthma   . Depression   . Ectopic pregnancy   . Irritable bowel syndrome (IBS) .  Marland Kitchen PTSD (post-traumatic stress disorder)    Past Surgical History:  Procedure Laterality Date  . TONSILLECTOMY    . TUBAL LIGATION     Current Outpatient Prescriptions on File Prior to  Visit  Medication Sig Dispense Refill  . buPROPion (WELLBUTRIN XL) 150 MG 24 hr tablet TAKE 1 TABLET EVERY DAY FOR DEPRESSION 30 tablet 1  . nystatin cream (MYCOSTATIN) Apply 1 application topically 2 (two) times daily. 30 g 2  . omeprazole (PRILOSEC) 20 MG capsule Take 1 capsule (20 mg total) by mouth daily as needed. 30 capsule 5  . vortioxetine HBr (TRINTELLIX) 10 MG TABS Take 1 tablet (10 mg total) by mouth daily. 30 tablet 5   No current facility-administered medications on file prior to visit.    Allergies  Allergen Reactions  . Latex Rash    Reaction to gloves   Social History   Social History  . Marital status: Single    Spouse name: N/A  . Number of children: N/A  . Years of education: N/A   Occupational History  . Not on file.   Social History Main Topics  . Smoking status: Former Smoker    Packs/day: 0.50    Types: Cigarettes  . Smokeless tobacco: Never Used  . Alcohol use Yes     Comment: social  . Drug use: Yes    Types: Marijuana  . Sexual activity: Yes    Birth control/ protection: None   Other Topics Concern  . Not on file   Social History Narrative  . No narrative on file      Review of Systems  Psychiatric/Behavioral: Positive for depression.  All other systems reviewed and are negative.      Objective:   Physical Exam  Cardiovascular: Normal rate and regular rhythm.   Pulmonary/Chest: Effort normal and breath sounds normal.  Psychiatric: Her speech is normal and behavior is normal. Judgment and thought content normal. Her mood appears not anxious. Cognition and memory are normal. She does not exhibit a depressed mood.  Vitals reviewed.         Assessment & Plan:  Depression, major, single episode, moderate (HCC)  I am concerned that the patient is demonstrating bipolar tendencies. Although she has no history of mania, she is certainly having fluctuating mood swings and some flight of ideas.I recommended decreasing trintellix to 5  mg a day to help with nausea.  I will add Lamictal 50 mg by mouth daily at bedtime as a mood stabilizer. I will begin weaning the patient off Wellbutrin decreasing 75 mg a day for 2 weeks and then 75 mg every other day for 2 weeks and then stop. I would like to see the patient back in one month to reassess.

## 2017-10-16 ENCOUNTER — Ambulatory Visit: Payer: Medicaid Other | Admitting: Family Medicine

## 2017-10-18 ENCOUNTER — Encounter: Payer: Self-pay | Admitting: Family Medicine

## 2017-10-23 ENCOUNTER — Encounter: Payer: Self-pay | Admitting: Family Medicine

## 2018-01-25 ENCOUNTER — Telehealth: Payer: Self-pay | Admitting: Family Medicine

## 2018-01-25 ENCOUNTER — Encounter: Payer: Self-pay | Admitting: Family Medicine

## 2018-01-25 ENCOUNTER — Ambulatory Visit: Payer: Medicaid Other | Admitting: Family Medicine

## 2018-01-25 VITALS — BP 130/76 | HR 90 | Temp 98.3°F | Resp 18 | Ht 68.0 in | Wt 316.0 lb

## 2018-01-25 DIAGNOSIS — F321 Major depressive disorder, single episode, moderate: Secondary | ICD-10-CM | POA: Diagnosis not present

## 2018-01-25 DIAGNOSIS — K219 Gastro-esophageal reflux disease without esophagitis: Secondary | ICD-10-CM

## 2018-01-25 DIAGNOSIS — G44209 Tension-type headache, unspecified, not intractable: Secondary | ICD-10-CM

## 2018-01-25 MED ORDER — TIZANIDINE HCL 4 MG PO TABS: 4.0000 mg | ORAL_TABLET | Freq: Four times a day (QID) | ORAL | 0 refills | Status: DC | PRN

## 2018-01-25 NOTE — Telephone Encounter (Signed)
Patient said she forgot to ask for refill on her omeprazole  Please if ok send to [facility]

## 2018-01-26 MED ORDER — OMEPRAZOLE 20 MG PO CPDR: 20.0000 mg | DELAYED_RELEASE_CAPSULE | Freq: Every day | ORAL | 3 refills | Status: DC | PRN

## 2018-01-26 NOTE — Progress Notes (Signed)
Subjective:    Patient ID: [name], female    DOB: [birth year], 37 y.o.   MRN: [id number]  Depression         Associated symptoms include headaches. Headache      07/20/17 Patient has previously been seen my partner. She was originally scheduled today for complete physical exam. However the physical exam today was deferred as we spent more than 30 minutes discussing her major concern which is overwhelming depression. She has a long-standing history of depression since she was a teenager. Possibly 4 months ago, the patient was taking Cymbalta which was working fair managing her depression that was causing severe depression of her libido. Therefore her psychiatrist switched her to a combination of Zoloft and Wellbutrin. Since that time, the depression has spiraled out of control. She has no desire. She has no motivation. She reports complete anhedonia. She reports depression. She reports constant anxiety. She is afraid to leave the home. She is not exercising or performing any task around the home due to lack of motivation. She is eating poorly. Today she is tearful throughout the entire encounter. She denies any suicidal thoughts or actions. She denies any hallucinations or delusions.  At that time, my plan was: Physical exam today was deferred to a later date. We need to get her depression under control. She is tried and failed Zoloft, Wellbutrin, and Cymbalta. I will continue the patient on Wellbutrin. I will discontinue Zoloft. I will start the patient on Trintellix milligrams a day and then increase to 10 mg a day in one week. I will like to see the patient back in 4 weeks. I have begged the patient to exercise 5 minutes every day just to improve self-esteem, improve motivation, and work on energy. I've also asked her to begin working on diet and try to start eating healthier limiting her consumption of bread and soda and trying to eat more fruits and vegetables. Recheck in 4-6  weeks.  08/17/17 Patient feels that she is doing substantially better since starting trintellix.  She states that she is not 100% better but she's noticed major improvement. She denies any side effects from the medication at this time. She is not exercising as we discussed however she continues to refrain from abusing alcohol or marijuana. She denies any symptoms of mania. She denies any insomnia. She is questioning whether she needs to stick with Wellbutrin because she is out of refills.  At that time, my plan was: Based on our interaction today, the patient seems much improved. She is eating smiling and laughing on today's encounter. Therefore I will continue the medication for an additional 4 weeks and then reassess. I would like her to continue the Wellbutrin at this time. However I would consider weaning her off the medication at her follow-up in 4 weeks if she continues to show improvement on Trintellix.    09/15/17 Patient reports severe nausea on trintellix 10 mg a day.  Her depression seems better. She recently celebrated a birthday of her son who is deceased and she was not etc. Is tearful as she has been in the past. However she is continuing to have mood swings. She is kicked out her boyfriend twice since I last saw her without good reason. During our entire encounter today, she appears to have flight of ideas. It is hard to follow her during our conversation. Frequently while talking, she will interrupt me and discuss something completely unrelated. She will often ask me a question  while in trying to answer it, she will change the subject and talk about something else unrelated. At one moment she is laughing. The next moment she is crying.  At that time, my plan was: I am concerned that the patient is demonstrating bipolar tendencies. Although she has no history of mania, she is certainly having fluctuating mood swings and some flight of ideas.I recommended decreasing trintellix to 5 mg a day to  help with nausea.  I will add Lamictal 50 mg by mouth daily at bedtime as a mood stabilizer. I will begin weaning the patient off Wellbutrin decreasing 75 mg a day for 2 weeks and then 75 mg every other day for 2 weeks and then stop. I would like to see the patient back in one month to reassess.  01/26/18 Patient is here today reporting increasing anxiety.  She reports worsening depression.  She is self-medicating with marijuana.  However the patient discontinued all medication 2 weeks after I saw her last time.  She does not think she needs the medication.  Today she has pressured speech.  Otherwise there is no evidence of mania.  However she frequently interrupts during our conversation.  It is difficult to even discussed with the patient as she constantly interrupts discussing unrelated issues.  She also reports headache for the last month.  Recently her uncle was placed in the ICU on a vegetative state.  She is the next of kin.  She is dealing with the stress, she has developed headache on both temples.  It is constant and pressure-like.  There is no photophobia or phonophobia or vomiting.  There are no visual changes or aura.  She denies any head injury.  She denies any neurologic deficits.  Headache is worse in the morning when she wakes up.  It is better with Sudafed.  She denies any concussion.  She has no past medical history of migraine Past Medical History:  Diagnosis Date  . Anxiety   . Asthma   . Depression   . Ectopic pregnancy   . Irritable bowel syndrome (IBS) .  Marland Kitchen PTSD (post-traumatic stress disorder)    Past Surgical History:  Procedure Laterality Date  . TONSILLECTOMY    . TUBAL LIGATION     Current Outpatient Medications on File Prior to Visit  Medication Sig Dispense Refill  . buPROPion (WELLBUTRIN XL) 150 MG 24 hr tablet TAKE 1 TABLET EVERY DAY FOR DEPRESSION (Patient not taking: Reported on 01/25/2018) 30 tablet 1  . buPROPion (WELLBUTRIN) 75 MG tablet Take 1 tablet (75 mg  total) by mouth daily. Take 1 pill a day for 2 weeks, the 1 pill every other day for 2 weeks then stop. (Patient not taking: Reported on 01/25/2018) 30 tablet 0  . lamoTRIgine (LAMICTAL) 25 MG tablet Take 2 tablets (50 mg total) by mouth daily. (Patient not taking: Reported on 01/25/2018) 60 tablet 4  . nystatin cream (MYCOSTATIN) Apply 1 application topically 2 (two) times daily. (Patient not taking: Reported on 01/25/2018) 30 g 2  . omeprazole (PRILOSEC) 20 MG capsule Take 1 capsule (20 mg total) by mouth daily as needed. (Patient not taking: Reported on 01/25/2018) 30 capsule 5  . vortioxetine HBr (TRINTELLIX) 10 MG TABS Take 1 tablet (10 mg total) by mouth daily. (Patient not taking: Reported on 01/25/2018) 30 tablet 5   No current facility-administered medications on file prior to visit.    Allergies  Allergen Reactions  . Latex Rash    Reaction to gloves  Social History   Socioeconomic History  . Marital status: Single    Spouse name: Not on file  . Number of children: Not on file  . Years of education: Not on file  . Highest education level: Not on file  Social Needs  . Financial resource strain: Not on file  . Food insecurity - worry: Not on file  . Food insecurity - inability: Not on file  . Transportation needs - medical: Not on file  . Transportation needs - non-medical: Not on file  Occupational History  . Not on file  Tobacco Use  . Smoking status: Former Smoker    Packs/day: 0.50    Types: Cigarettes  . Smokeless tobacco: Never Used  Substance and Sexual Activity  . Alcohol use: Yes    Comment: social  . Drug use: Yes    Types: Marijuana  . Sexual activity: Yes    Birth control/protection: None  Other Topics Concern  . Not on file  Social History Narrative  . Not on file      Review of Systems  Neurological: Positive for headaches.  Psychiatric/Behavioral: Positive for depression.  All other systems reviewed and are negative.      Objective:    Physical Exam  Constitutional: She is oriented to person, place, and time.  HENT:  Right Ear: External ear normal.  Left Ear: External ear normal.  Nose: Nose normal.  Mouth/Throat: Oropharynx is clear and moist. No oropharyngeal exudate.  Eyes: Conjunctivae and EOM are normal. Pupils are equal, round, and reactive to light.  Cardiovascular: Normal rate and regular rhythm.  Pulmonary/Chest: Effort normal and breath sounds normal.  Neurological: She is alert and oriented to person, place, and time. She has normal reflexes. She displays normal reflexes. No cranial nerve deficit. She exhibits normal muscle tone. Coordination normal.  Psychiatric: Her speech is normal and behavior is normal. Judgment and thought content normal. Her mood appears not anxious. Cognition and memory are normal. She does not exhibit a depressed mood.  Vitals reviewed.         Assessment & Plan:  Depression, major, single episode, moderate (HCC) - Plan: Ambulatory referral to Psychiatry  Tension headache  I believe the patient is suffering from depression but I I am also concerned about possible bipolar disorder.  Patient is noncompliant with medication and is not following up as recommended.  At this point I recommended referring the patient to a psychiatrist with more experience in bipolar disorder.  I see no evidence of increased intracranial pressure today on exam.  She has no previous history of migraines.  There is no evidence of a sinus infection.  I believe this is most likely a tension headache likely contributed to by stress.  Try Zanaflex 4 mg every 6 hours as needed for headache and reassess in 1 week if no better or sooner if worse

## 2018-01-26 NOTE — Telephone Encounter (Signed)
Medication called/sent to requested pharmacy  

## 2018-03-22 ENCOUNTER — Encounter: Payer: Self-pay | Admitting: Family Medicine

## 2018-03-22 ENCOUNTER — Ambulatory Visit: Payer: Medicaid Other | Admitting: Family Medicine

## 2018-03-22 VITALS — BP 100/64 | HR 100 | Temp 98.6°F | Resp 18 | Ht 68.0 in | Wt 306.0 lb

## 2018-03-22 DIAGNOSIS — M25551 Pain in right hip: Secondary | ICD-10-CM | POA: Diagnosis not present

## 2018-03-22 MED ORDER — MELOXICAM 15 MG PO TABS: 15.0000 mg | ORAL_TABLET | Freq: Every day | ORAL | 0 refills | Status: DC

## 2018-03-22 NOTE — Progress Notes (Signed)
Subjective:    Patient ID: [name], female    DOB: 1981/01/28, 37 y.o.   MRN: [id number]  HPI  Patient states she has had right hip pain for several months.  It hurts to stand.  It hurts with internal rotation.  FADIR sign elicits significant pain on the lateral aspect of the hip as well as in the anterior hip joint.  She has no pain with FABER.  She has no tenderness to palpation over the greater trochanteric bursa.  She has no pain with hip abduction against resistance.  She has no pain with hip abduction.  There is no numbness or tingling radiating down her leg.  Pain is made worse by prolonged standing.  She is now working and walking long shifts on concrete floors. Past Medical History:  Diagnosis Date  . Anxiety   . Asthma   . Depression   . Ectopic pregnancy   . Irritable bowel syndrome (IBS) .  Marland Kitchen PTSD (post-traumatic stress disorder)    Past Surgical History:  Procedure Laterality Date  . TONSILLECTOMY    . TUBAL LIGATION     Current Outpatient Medications on File Prior to Visit  Medication Sig Dispense Refill  . gabapentin (NEURONTIN) 600 MG tablet TAKE 1/2 TABLET(S) BY MOUTH TWICE A DAY FOR ANXIETY/MOOD.  1  . omeprazole (PRILOSEC) 20 MG capsule Take 20 mg by mouth daily as needed.     No current facility-administered medications on file prior to visit.    Allergies  Allergen Reactions  . Latex Rash    Reaction to gloves   Social History   Socioeconomic History  . Marital status: Single    Spouse name: Not on file  . Number of children: Not on file  . Years of education: Not on file  . Highest education level: Not on file  Occupational History  . Not on file  Social Needs  . Financial resource strain: Not on file  . Food insecurity:    Worry: Not on file    Inability: Not on file  . Transportation needs:    Medical: Not on file    Non-medical: Not on file  Tobacco Use  . Smoking status: Former Smoker    Packs/day: 0.50    Types: Cigarettes  .  Smokeless tobacco: Never Used  Substance and Sexual Activity  . Alcohol use: Yes    Comment: social  . Drug use: Yes    Types: Marijuana  . Sexual activity: Yes    Birth control/protection: None  Lifestyle  . Physical activity:    Days per week: Not on file    Minutes per session: Not on file  . Stress: Not on file  Relationships  . Social connections:    Talks on phone: Not on file    Gets together: Not on file    Attends religious service: Not on file    Active member of club or organization: Not on file    Attends meetings of clubs or organizations: Not on file    Relationship status: Not on file  . Intimate partner violence:    Fear of current or ex partner: Not on file    Emotionally abused: Not on file    Physically abused: Not on file    Forced sexual activity: Not on file  Other Topics Concern  . Not on file  Social History Narrative  . Not on file     Review of Systems  All other systems reviewed  and are negative.      Objective:   Physical Exam  Cardiovascular: Normal rate, regular rhythm and normal heart sounds.  Pulmonary/Chest: Effort normal and breath sounds normal.  Musculoskeletal:       Right hip: She exhibits decreased range of motion and tenderness. She exhibits normal strength, no bony tenderness, no swelling, no crepitus, no deformity and no laceration.       Legs: Vitals reviewed.         Assessment & Plan:  Right hip pain - Plan: DG HIP UNILAT WITH PELVIS 2-3 VIEWS RIGHT, meloxicam (MOBIC) 15 MG tablet  Given symptoms, exam, and elevated BMI, I anticipate early osteoarthritis.  Begin meloxicam 15 mg a day and check x-ray of the right hip to rule out other underlying causes

## 2018-03-26 ENCOUNTER — Ambulatory Visit
Admission: RE | Admit: 2018-03-26 | Discharge: 2018-03-26 | Disposition: A | Discharge status: Home / Self Care | Payer: Self-pay | Source: Ambulatory Visit | Attending: Family Medicine | Admitting: Family Medicine

## 2018-03-26 DIAGNOSIS — M25551 Pain in right hip: Secondary | ICD-10-CM

## 2018-04-06 ENCOUNTER — Encounter: Payer: Self-pay | Admitting: Family Medicine

## 2018-04-06 ENCOUNTER — Ambulatory Visit: Payer: Medicaid Other | Admitting: Family Medicine

## 2018-04-06 VITALS — BP 100/74 | HR 100 | Temp 98.3°F | Resp 18 | Ht 68.0 in | Wt 304.0 lb

## 2018-04-06 DIAGNOSIS — M25562 Pain in left knee: Secondary | ICD-10-CM | POA: Diagnosis not present

## 2018-04-06 DIAGNOSIS — R42 Dizziness and giddiness: Secondary | ICD-10-CM | POA: Diagnosis not present

## 2018-04-06 NOTE — Progress Notes (Signed)
Subjective:    Patient ID: [name], female    DOB: 06-20-1981, 37 y.o.   MRN: [id number]  HPI 03/22/18 Patient states she has had right hip pain for several months.  It hurts to stand.  It hurts with internal rotation.  FADIR sign elicits significant pain on the lateral aspect of the hip as well as in the anterior hip joint.  She has no pain with FABER.  She has no tenderness to palpation over the greater trochanteric bursa.  She has no pain with hip abduction against resistance.  She has no pain with hip abduction.  There is no numbness or tingling radiating down her leg.  Pain is made worse by prolonged standing.  She is now working and walking long shifts on concrete floors.  At that time, my plan was:  Given symptoms, exam, and elevated BMI, I anticipate early osteoarthritis.  Begin meloxicam 15 mg a day and check x-ray of the right hip to rule out other underlying causes  04/06/18 X-ray revealed no abnormality in the right hip.  I gave the patient meloxicam and patient states that the right hip pain went away after 1 week.  Now her right hip feels fine.  However she is developed pain on her left knee.  The pain is located underneath her kneecap.  It is worse with prolonged standing.  It hurts to stand from a seated position.  She attributes this to increase standing and walking with her new job.  She states that in the morning her knee feels fine however after a long shift, she has significant pain directly underneath the patella and subjective effusions although there is no visible swelling or effusion today.  She also reports crepitus in the knee joint.  She denies any specific injury.  She also reports occasional dizziness.  It is usually orthostatic in nature.  Her blood pressure today is slightly low and her heart rate is borderline elevated suggesting possible mild dehydration.  She admits that her job she is not given time to even get a drink of water.  She admits that she has not been  drinking enough.  However she does not appear to be severely dehydrated today. Past Medical History:  Diagnosis Date  . Anxiety   . Asthma   . Depression   . Ectopic pregnancy   . Irritable bowel syndrome (IBS) .  Marland Kitchen PTSD (post-traumatic stress disorder)    Past Surgical History:  Procedure Laterality Date  . TONSILLECTOMY    . TUBAL LIGATION     Current Outpatient Medications on File Prior to Visit  Medication Sig Dispense Refill  . gabapentin (NEURONTIN) 600 MG tablet TAKE 1/2 TABLET(S) BY MOUTH TWICE A DAY FOR ANXIETY/MOOD.  1  . omeprazole (PRILOSEC) 20 MG capsule Take 20 mg by mouth daily as needed.    . meloxicam (MOBIC) 15 MG tablet Take 1 tablet (15 mg total) by mouth daily. (Patient not taking: Reported on 04/06/2018) 30 tablet 0   No current facility-administered medications on file prior to visit.    Allergies  Allergen Reactions  . Latex Rash    Reaction to gloves   Social History   Socioeconomic History  . Marital status: Single    Spouse name: Not on file  . Number of children: Not on file  . Years of education: Not on file  . Highest education level: Not on file  Occupational History  . Not on file  Social Needs  . Financial  resource strain: Not on file  . Food insecurity:    Worry: Not on file    Inability: Not on file  . Transportation needs:    Medical: Not on file    Non-medical: Not on file  Tobacco Use  . Smoking status: Former Smoker    Packs/day: 0.50    Types: Cigarettes  . Smokeless tobacco: Never Used  Substance and Sexual Activity  . Alcohol use: Yes    Comment: social  . Drug use: Yes    Types: Marijuana  . Sexual activity: Yes    Birth control/protection: None  Lifestyle  . Physical activity:    Days per week: Not on file    Minutes per session: Not on file  . Stress: Not on file  Relationships  . Social connections:    Talks on phone: Not on file    Gets together: Not on file    Attends religious service: Not on file     Active member of club or organization: Not on file    Attends meetings of clubs or organizations: Not on file    Relationship status: Not on file  . Intimate partner violence:    Fear of current or ex partner: Not on file    Emotionally abused: Not on file    Physically abused: Not on file    Forced sexual activity: Not on file  Other Topics Concern  . Not on file  Social History Narrative  . Not on file     Review of Systems  All other systems reviewed and are negative.      Objective:   Physical Exam  Cardiovascular: Normal rate, regular rhythm and normal heart sounds.  Pulmonary/Chest: Effort normal and breath sounds normal.  Musculoskeletal:       Left knee: She exhibits decreased range of motion. She exhibits no swelling, no effusion, no ecchymosis, no deformity, no erythema, no LCL laxity, normal meniscus and no MCL laxity. Tenderness found. No medial joint line, no lateral joint line, no MCL and no LCL tenderness noted.  Vitals reviewed.   Patient has pain underneath her patella with flexion of the knee.      Assessment & Plan:  Dizziness - Plan: CBC with Differential/Platelet, COMPLETE METABOLIC PANEL WITH GFR  Acute pain of left knee I suspect patellofemoral pain of the right knee.  Recommended that she use the meloxicam that I gave her for the hip for the right knee pain.  Recommended that she rest her knee at night, apply ice to the affected joint and allow 1 week for symptoms to improve.  If no better, proceed with an x-ray of the right knee.  Dizziness I suspect is due to dehydration coupled with medications.  I have recommended trying to drink more water.  Meanwhile I will check a CBC to rule out anemia.  I will check a BMP to evaluate for signs of dehydration or other electrolyte abnormalities.

## 2018-04-07 LAB — CBC WITH DIFFERENTIAL/PLATELET
BASOS ABS: 17 {cells}/uL (ref 0–200)
Basophils Relative: 0.2 %
Eosinophils Absolute: 128 cells/uL (ref 15–500)
Eosinophils Relative: 1.5 %
HCT: 36.4 % (ref 35.0–45.0)
Hemoglobin: 12.5 g/dL (ref 11.7–15.5)
LYMPHS ABS: 2372 {cells}/uL (ref 850–3900)
MCH: 28 pg (ref 27.0–33.0)
MCHC: 34.3 g/dL (ref 32.0–36.0)
MCV: 81.6 fL (ref 80.0–100.0)
MPV: 10.9 fL (ref 7.5–12.5)
Monocytes Relative: 4.4 %
NEUTROS PCT: 66 %
Neutro Abs: 5610 cells/uL (ref 1500–7800)
Platelets: 178 10*3/uL (ref 140–400)
RBC: 4.46 10*6/uL (ref 3.80–5.10)
RDW: 12.6 % (ref 11.0–15.0)
Total Lymphocyte: 27.9 %
WBC: 8.5 10*3/uL (ref 3.8–10.8)
WBCMIX: 374 {cells}/uL (ref 200–950)

## 2018-04-07 LAB — COMPLETE METABOLIC PANEL WITH GFR
AG RATIO: 1.7 (calc) (ref 1.0–2.5)
ALKALINE PHOSPHATASE (APISO): 86 U/L (ref 33–115)
ALT: 39 U/L — ABNORMAL HIGH (ref 6–29)
AST: 35 U/L — AB (ref 10–30)
Albumin: 4 g/dL (ref 3.6–5.1)
BILIRUBIN TOTAL: 0.6 mg/dL (ref 0.2–1.2)
BUN/Creatinine Ratio: 8 (calc) (ref 6–22)
BUN: 5 mg/dL — ABNORMAL LOW (ref 7–25)
CO2: 28 mmol/L (ref 20–32)
Calcium: 9.1 mg/dL (ref 8.6–10.2)
Chloride: 106 mmol/L (ref 98–110)
Creat: 0.66 mg/dL (ref 0.50–1.10)
GFR, EST NON AFRICAN AMERICAN: 111 mL/min/{1.73_m2} (ref >=60)
GFR, Est African American: 129 mL/min/{1.73_m2} (ref >=60)
Globulin: 2.3 g/dL (calc) (ref 1.9–3.7)
Glucose, Bld: 88 mg/dL (ref 65–99)
POTASSIUM: 3.5 mmol/L (ref 3.5–5.3)
SODIUM: 142 mmol/L (ref 135–146)
Total Protein: 6.3 g/dL (ref 6.1–8.1)

## 2018-05-16 ENCOUNTER — Other Ambulatory Visit: Payer: Self-pay

## 2018-05-16 ENCOUNTER — Ambulatory Visit: Payer: Medicaid Other | Admitting: Physician Assistant

## 2018-05-16 ENCOUNTER — Encounter: Payer: Self-pay | Admitting: Physician Assistant

## 2018-05-16 VITALS — BP 118/80 | HR 93 | Resp 18 | Ht 68.0 in | Wt 297.0 lb

## 2018-05-16 DIAGNOSIS — N760 Acute vaginitis: Secondary | ICD-10-CM

## 2018-05-16 DIAGNOSIS — B9689 Other specified bacterial agents as the cause of diseases classified elsewhere: Secondary | ICD-10-CM

## 2018-05-16 LAB — WET PREP FOR TRICH, YEAST, CLUE

## 2018-05-16 MED ORDER — METRONIDAZOLE 500 MG PO TABS: 500.0000 mg | ORAL_TABLET | Freq: Two times a day (BID) | ORAL | 0 refills | Status: AC

## 2018-05-16 NOTE — Progress Notes (Signed)
    Patient ID: [name] MRN: [id number], DOB: 1981/08/21, 37 y.o. Date of Encounter: 05/16/2018, 4:18 PM    Chief Complaint:  Chief Complaint  Patient presents with  . vaginal irritation  . vaginal odor     HPI: 37 y.o. year old female presents with above.   She reports that she is having vaginal irritation and vaginal discharge. Reports that she has had bacterial vaginosis in the past so wants to check for that but also wants to check in case any other sexually transmitted disease could be causing this irritation and discharge.  Has recently had unprotected sex so does want to check for STDs that could cause vaginal irritation. However does not want to do a full panel for screening for STDs.  Says that she has had this sexual partner in the past and is not concerned about HIV etc. and does not want to do blood work.  Just wants to check for cause of vaginal irritation.  She has had no pelvic pain.  No fevers or chills.     Home Meds:   Outpatient Medications Prior to Visit  Medication Sig Dispense Refill  . gabapentin (NEURONTIN) 600 MG tablet TAKE 1/2 TABLET(S) BY MOUTH TWICE A DAY FOR ANXIETY/MOOD.  1  . omeprazole (PRILOSEC) 20 MG capsule Take 20 mg by mouth daily as needed.    . meloxicam (MOBIC) 15 MG tablet Take 1 tablet (15 mg total) by mouth daily. (Patient not taking: Reported on 04/06/2018) 30 tablet 0   No facility-administered medications prior to visit.     Allergies:  Allergies  Allergen Reactions  . Latex Rash    Reaction to gloves      Review of Systems: See HPI for pertinent ROS. All other ROS negative.    Physical Exam: Blood pressure 118/80, pulse 93, resp. rate 18, height 5\' 8"  (1.727 m), weight 134.7 kg (297 lb), last menstrual period 04/26/2018, SpO2 96 %., Body mass index is 45.16 kg/m. General:  WF. Appears in no acute distress. Neck: Supple. No thyromegaly. No lymphadenopathy. Lungs: Clear bilaterally to auscultation without wheezes, rales,  or rhonchi. Breathing is unlabored. Heart: Regular rhythm. No murmurs, rubs, or gallops. Msk:  Strength and tone normal for age. Pelvic Exam: External genitalia normal.  No skin lesions seen.  Vaginal mucosa normal.  No skin lesions seen there.  Cervix normal.  No cervical motion tenderness.  Bimanual exam is normal without no adnexal mass or tenderness.  Small amount of liquidy white discharge present. Extremities/Skin: Warm and dry.  Neuro: Alert and oriented X 3. Moves all extremities spontaneously. Gait is normal. CNII-XII grossly in tact. Psych:  Responds to questions appropriately with a normal affect.     ASSESSMENT AND PLAN:  37 y.o. year old female with   1. Bacterial vaginosis Wet Prep shows clue cells.  Will go ahead and treat BV.  Will follow-up results of gonorrhea chlamydia test as well. - metroNIDAZOLE (FLAGYL) 500 MG tablet; Take 1 tablet (500 mg total) by mouth 2 (two) times daily for 7 days.  Dispense: 14 tablet; Refill: 0  2. Vaginitis and vulvovaginitis - WET PREP FOR TRICH, YEAST, CLUE - C. TRACHOMATIS/N. GONORRHOEAE RNA ([facility])    Signed, [address]Mary Beth GarfieldDixon, [place], [facility] 05/16/2018 4:18 PM

## 2018-05-17 LAB — C. TRACHOMATIS/N. GONORRHOEAE RNA
C. trachomatis RNA, TMA: NOT DETECTED
N. gonorrhoeae RNA, TMA: NOT DETECTED

## 2018-05-23 ENCOUNTER — Emergency Department (HOSPITAL_COMMUNITY)
Admission: EM | Admit: 2018-05-23 | Discharge: 2018-05-23 | Disposition: A | Discharge status: Home / Self Care | Payer: Medicaid Other | Attending: Emergency Medicine | Admitting: Emergency Medicine

## 2018-05-23 ENCOUNTER — Other Ambulatory Visit: Payer: Self-pay

## 2018-05-23 ENCOUNTER — Encounter (HOSPITAL_COMMUNITY): Payer: Self-pay

## 2018-05-23 DIAGNOSIS — R42 Dizziness and giddiness: Secondary | ICD-10-CM | POA: Diagnosis not present

## 2018-05-23 DIAGNOSIS — Z87891 Personal history of nicotine dependence: Secondary | ICD-10-CM | POA: Insufficient documentation

## 2018-05-23 DIAGNOSIS — Z79899 Other long term (current) drug therapy: Secondary | ICD-10-CM | POA: Insufficient documentation

## 2018-05-23 DIAGNOSIS — R51 Headache: Secondary | ICD-10-CM | POA: Insufficient documentation

## 2018-05-23 DIAGNOSIS — J45909 Unspecified asthma, uncomplicated: Secondary | ICD-10-CM | POA: Diagnosis not present

## 2018-05-23 DIAGNOSIS — R0602 Shortness of breath: Secondary | ICD-10-CM | POA: Diagnosis present

## 2018-05-23 DIAGNOSIS — R519 Headache, unspecified: Secondary | ICD-10-CM

## 2018-05-23 LAB — BASIC METABOLIC PANEL
Anion gap: 7 (ref 5–15)
BUN: 6 mg/dL (ref 6–20)
CALCIUM: 8.8 mg/dL — AB (ref 8.9–10.3)
CHLORIDE: 108 mmol/L (ref 98–111)
CO2: 27 mmol/L (ref 22–32)
CREATININE: 0.65 mg/dL (ref 0.44–1.00)
GFR calc non Af Amer: >60 mL/min (ref >60)
Glucose, Bld: 117 mg/dL — ABNORMAL HIGH (ref 70–99)
Potassium: 3.4 mmol/L — ABNORMAL LOW (ref 3.5–5.1)
SODIUM: 142 mmol/L (ref 135–145)

## 2018-05-23 LAB — URINALYSIS, ROUTINE W REFLEX MICROSCOPIC
Bacteria, UA: NONE SEEN
Bilirubin Urine: NEGATIVE
Glucose, UA: NEGATIVE mg/dL
Ketones, ur: NEGATIVE mg/dL
Nitrite: NEGATIVE
PH: 6 (ref 5.0–8.0)
Protein, ur: NEGATIVE mg/dL
SPECIFIC GRAVITY, URINE: 1.017 (ref 1.005–1.030)

## 2018-05-23 LAB — I-STAT BETA HCG BLOOD, ED (MC, WL, AP ONLY)

## 2018-05-23 LAB — CBC
HCT: 40.1 % (ref 36.0–46.0)
Hemoglobin: 13.2 g/dL (ref 12.0–15.0)
MCH: 28.1 pg (ref 26.0–34.0)
MCHC: 32.9 g/dL (ref 30.0–36.0)
MCV: 85.5 fL (ref 78.0–100.0)
PLATELETS: 194 10*3/uL (ref 150–400)
RBC: 4.69 MIL/uL (ref 3.87–5.11)
RDW: 13.2 % (ref 11.5–15.5)
WBC: 8.6 10*3/uL (ref 4.0–10.5)

## 2018-05-23 MED ORDER — DIPHENHYDRAMINE HCL 50 MG/ML IJ SOLN
25.0000 mg | Freq: Once | INTRAMUSCULAR | Status: AC
  Administered 2018-05-23: 25 mg via INTRAVENOUS
  Filled 2018-05-23: qty 1

## 2018-05-23 MED ORDER — PROCHLORPERAZINE EDISYLATE 10 MG/2ML IJ SOLN
10.0000 mg | Freq: Once | INTRAMUSCULAR | Status: AC
  Administered 2018-05-23: 10 mg via INTRAVENOUS
  Filled 2018-05-23: qty 2

## 2018-05-23 MED ORDER — SODIUM CHLORIDE 0.9 % IV BOLUS
1000.0000 mL | Freq: Once | INTRAVENOUS | Status: AC
  Administered 2018-05-23: 1000 mL via INTRAVENOUS

## 2018-05-23 NOTE — Discharge Instructions (Signed)
Your laboratory work-up today was reassuring.  We feel you are safe for discharge home given the resolution of her headache.  Please stay hydrated and follow-up with your primary doctor.  Please try to avoid stress.  If any symptoms change or worsen, please return to the nearest emergency department.

## 2018-05-23 NOTE — ED Triage Notes (Addendum)
Patient c/o SOB, near syncope x 3 (during court today), anxiety, headache, insomnia, and dizziness. Patient reports sensitivity to light.

## 2018-05-23 NOTE — ED Provider Notes (Signed)
[facility]-EMERGENCY DEPT Provider Note   CSN: [id number] Arrival date & time: 05/23/18  1648     History   Chief Complaint Chief Complaint  Patient presents with  . Shortness of Breath  . Headache  . Dizziness  . Near Syncope    HPI [name] is a 37 y.o. female.  The history is provided by the patient and a parent.  Shortness of Breath  This is a new problem. The problem occurs intermittently.The problem has been resolved. Associated symptoms include headaches. Pertinent negatives include no fever, no rhinorrhea, no neck pain, no cough, no sputum production, no hemoptysis, no wheezing, no chest pain, no syncope, no vomiting, no abdominal pain, no rash, no leg pain and no leg swelling. She has tried nothing for the symptoms.  Headache   This is a recurrent problem. The current episode started 3 to 5 hours ago. The problem occurs constantly. The problem has not changed since onset.The headache is associated with bright light, loud noise, the menstrual cycle and emotional stress. The pain is located in the frontal region. The quality of the pain is described as throbbing. The pain is at a severity of 8/10. The pain is severe. Associated symptoms include near-syncope, palpitations, shortness of breath and nausea. Pertinent negatives include no fever, no malaise/fatigue, no chest pressure, no syncope and no vomiting. She has tried nothing for the symptoms. The treatment provided no relief.  Dizziness  Associated symptoms: headaches, nausea, palpitations and shortness of breath   Associated symptoms: no chest pain, no diarrhea, no syncope, no vomiting and no weakness   Near Syncope  Associated symptoms include headaches and shortness of breath. Pertinent negatives include no chest pain and no abdominal pain.    Past Medical History:  Diagnosis Date  . Anxiety   . Asthma   . Depression   . Ectopic pregnancy   . Irritable bowel syndrome (IBS) .  Marland Kitchen PTSD  (post-traumatic stress disorder)     Patient Active Problem List   Diagnosis Date Noted  . GERD (gastroesophageal reflux disease) 06/08/2017  . MDD (major depressive disorder), recurrent episode, severe (HCC) 11/16/2015  . Marijuana abuse     Past Surgical History:  Procedure Laterality Date  . TONSILLECTOMY    . TUBAL LIGATION       OB History   None      Home Medications    Prior to Admission medications   Medication Sig Start Date End Date Taking? Authorizing Provider  gabapentin (NEURONTIN) 600 MG tablet TAKE 1/2 TABLET(S) BY MOUTH TWICE A DAY FOR ANXIETY/MOOD. 02/16/18   [provider]  metroNIDAZOLE (FLAGYL) 500 MG tablet Take 1 tablet (500 mg total) by mouth 2 (two) times daily for 7 days. 05/16/18 05/23/18  Dorena Bodo, PA-C  omeprazole (PRILOSEC) 20 MG capsule Take 20 mg by mouth daily as needed.    [provider]    Family History History reviewed. No pertinent family history.  Social History Social History   Tobacco Use  . Smoking status: Former Smoker    Packs/day: 0.50    Types: Cigarettes  . Smokeless tobacco: Never Used  Substance Use Topics  . Alcohol use: Yes    Comment: social  . Drug use: Not Currently    Types: Marijuana     Allergies   Latex   Review of Systems Review of Systems  Constitutional: Negative for chills, diaphoresis, fatigue, fever and malaise/fatigue.  HENT: Negative for congestion and rhinorrhea.  Eyes: Positive for photophobia. Negative for visual disturbance.  Respiratory: Positive for shortness of breath. Negative for cough, hemoptysis, sputum production, chest tightness and wheezing.   Cardiovascular: Positive for palpitations and near-syncope. Negative for chest pain, leg swelling and syncope.  Gastrointestinal: Positive for nausea. Negative for abdominal pain, diarrhea and vomiting.  Genitourinary: Positive for vaginal bleeding (on menstrual cycle). Negative for dysuria, flank pain and  frequency.  Musculoskeletal: Negative for back pain, neck pain and neck stiffness.  Skin: Negative for rash and wound.  Neurological: Positive for light-headedness and headaches. Negative for dizziness, seizures, syncope, facial asymmetry, speech difficulty and weakness.  Psychiatric/Behavioral: Negative for agitation.  All other systems reviewed and are negative.    Physical Exam Updated Vital Signs BP (!) 141/90 (BP Location: Right Arm)   Pulse 82   Temp 98.5 F (36.9 C) (Oral)   Resp 15   Ht 5\' 8"  (1.727 m)   Wt 134.7 kg (297 lb)   LMP 05/23/2018   SpO2 95%   BMI 45.16 kg/m   Physical Exam  Constitutional: She is oriented to person, place, and time. She appears well-developed and well-nourished.  Non-toxic appearance. She does not appear ill. No distress.  HENT:  Head: Normocephalic and atraumatic.  Nose: Nose normal.  Mouth/Throat: Oropharynx is clear and moist. No oropharyngeal exudate.  Eyes: Pupils are equal, round, and reactive to light. Conjunctivae and EOM are normal.  Neck: Normal range of motion. Neck supple.  Cardiovascular: Normal rate and regular rhythm.  No murmur heard. Pulmonary/Chest: Effort normal and breath sounds normal. No respiratory distress. She has no decreased breath sounds. She has no wheezes. She has no rhonchi. She has no rales. She exhibits no tenderness.  Abdominal: Soft. There is no tenderness.  Musculoskeletal: She exhibits no edema or tenderness.       Right lower leg: She exhibits no tenderness and no edema.       Left lower leg: She exhibits no tenderness and no edema.  Neurological: She is alert and oriented to person, place, and time. No sensory deficit. She exhibits normal muscle tone.  Skin: Skin is warm and dry. Capillary refill takes less than 2 seconds. No rash noted. She is not diaphoretic. No erythema.  Psychiatric: She has a normal mood and affect.  Nursing note and vitals reviewed.    ED Treatments / Results  Labs (all  labs ordered are listed, but only abnormal results are displayed) Labs Reviewed  BASIC METABOLIC PANEL - Abnormal; Notable for the following components:      Result Value   Potassium 3.4 (*)    Glucose, Bld 117 (*)    Calcium 8.8 (*)    All other components within normal limits  URINALYSIS, ROUTINE W REFLEX MICROSCOPIC - Abnormal; Notable for the following components:   Hgb urine dipstick LARGE (*)    Leukocytes, UA TRACE (*)    All other components within normal limits  CBC  CBG MONITORING, ED  I-STAT BETA HCG BLOOD, ED ([facility], [facility], [facility] ONLY)    EKG EKG Interpretation  Date/Time:  Wednesday May 23 2018 17:15:53 EDT Ventricular Rate:  83 PR Interval:    QRS Duration: 102 QT Interval:  383 QTC Calculation: 450 R Axis:   29 Text Interpretation:  Sinus rhythm Low voltage, precordial leads When comapred to prior, no significant changes seen.  No STEMI Confirmed by Theda Belfast ([id number]) on 05/23/2018 7:21:03 PM   Radiology No results found.  Procedures Procedures (including critical care time)  Medications Ordered in ED Medications  sodium chloride 0.9 % bolus 1,000 mL (1,000 mLs Intravenous New Bag/Given 05/23/18 2007)  prochlorperazine (COMPAZINE) injection 10 mg (10 mg Intravenous Given 05/23/18 2008)  diphenhydrAMINE (BENADRYL) injection 25 mg (25 mg Intravenous Given 05/23/18 2008)     Initial Impression / Assessment and Plan / ED Course  I have reviewed the triage vital signs and the nursing notes.  Pertinent labs & imaging results that were available during my care of the patient were reviewed by me and considered in my medical decision making (see chart for details).     DENESHA BROUSE is a 37 y.o. female with a past medical history significant for depression, PTSD, asthma, severe anxiety, and irritable bowel syndrome who presents with headache, photophobia, nausea, severe anxiety, and episodes of lightheadedness and anxiety today.  Patient reports that for the last 2 weeks  she has had increase in stress.  She says that her uncle died whom she was close with and she also has recently gotten separated from a long relationship.  She reports that today in child support court, patient began feeling very anxious.  She reports feeling lightheaded.  She says she had to leave the court room multiple x2/water on her face.  She reports getting headaches.  She also reported nausea but no vomiting.  She reported lightheadedness.  She says she has not been eating or drinking much for the last few weeks and may be dehydrated.  She says that she recently quit smoking marijuana.  She says she has not been sleeping well with her increased stress.  She reports she is having occasional palpitations but denies any chest pain today.  She reports extreme photophobia and phonophobia and reports her headache is 8 out of 10 in severity and frontal.  She reports she has had some headaches in the past.  On exam, patient has no focal neurologic deficits.  Patient has clear lungs.  Chest is nontender.  Abdomen is nontender.  Normal coordination, sensation, and clear speech.  Patient overall appears well.  I am most concerned about the patient having a stress-induced headache.  Patient will be given a headache cocktail.  Given her reassuring exam, do not feel patient needs head CT at this time.  Patient will have screening laboratory testing with her reported decreased oral intake and the nausea and lightheaded spells today.  She did not syncopized.  Patient's EKG was reassuring.  Patient was given headache cocktail and fluids and reassess.  If patient is feeling better and has normal orthostatics, anticipate she will be stable for discharge home.      9:35 PM Patient reassessed and her headache is completely resolved.  Laboratory testing was overall reassuring.  Mild hypokalemia.  Patient encouraged to improve her diet.  Hemoglobin urine likely secondary to her menstrual cycle is ongoing.  Given  resolved symptoms, patient was felt stable for discharge home.  Patient will follow-up with her PCP.  Patient understood return precautions and was discharged in good condition.  Final Clinical Impressions(s) / ED Diagnoses   Final diagnoses:  Acute nonintractable headache, unspecified headache type  Lightheaded    ED Discharge Orders    None     Clinical Impression: 1. Acute nonintractable headache, unspecified headache type   2. Lightheaded     Disposition: Discharge  Condition: Good  I have discussed the results, Dx and Tx plan with the pt(& family if present). He/she/they expressed understanding and agree(s) with the plan. Discharge instructions  discussed at great length. Strict return precautions discussed and pt &/or family have verbalized understanding of the instructions. No further questions at time of discharge.    New Prescriptions   No medications on file    Follow Up: Donita Brooksickard, Warren T, MD [address] [place] [id number] [phone]     [facility] [address] [id number] [address] [place] [id number] [phone]       Tegeler, Canary Brimhristopher J, MD 05/23/18 2136

## 2018-05-30 ENCOUNTER — Ambulatory Visit: Payer: Self-pay | Admitting: Family Medicine

## 2018-07-11 ENCOUNTER — Encounter: Payer: Self-pay | Admitting: Physician Assistant

## 2018-07-11 ENCOUNTER — Ambulatory Visit: Payer: Medicaid Other | Admitting: Physician Assistant

## 2018-07-11 VITALS — BP 100/82 | HR 101 | Temp 98.4°F | Resp 18 | Ht 68.0 in | Wt 287.0 lb

## 2018-07-11 DIAGNOSIS — B3731 Acute candidiasis of vulva and vagina: Secondary | ICD-10-CM

## 2018-07-11 DIAGNOSIS — Z113 Encounter for screening for infections with a predominantly sexual mode of transmission: Secondary | ICD-10-CM | POA: Diagnosis not present

## 2018-07-11 DIAGNOSIS — N760 Acute vaginitis: Secondary | ICD-10-CM | POA: Diagnosis not present

## 2018-07-11 DIAGNOSIS — B3789 Other sites of candidiasis: Secondary | ICD-10-CM

## 2018-07-11 DIAGNOSIS — B373 Candidiasis of vulva and vagina: Secondary | ICD-10-CM | POA: Diagnosis not present

## 2018-07-11 LAB — WET PREP FOR TRICH, YEAST, CLUE

## 2018-07-11 MED ORDER — NYSTATIN 100000 UNIT/GM EX POWD: Freq: Four times a day (QID) | CUTANEOUS | 0 refills | Status: DC

## 2018-07-11 MED ORDER — FLUCONAZOLE 150 MG PO TABS: 150.0000 mg | ORAL_TABLET | Freq: Once | ORAL | 0 refills | Status: AC

## 2018-07-11 NOTE — Progress Notes (Signed)
    Patient ID: [name] MRN: [id number], DOB: 04/02/1981, 37 y.o. Date of Encounter: 07/11/2018, 4:02 PM    Chief Complaint:  Chief Complaint  Patient presents with  . Vaginal Discharge    odor   . raw area under breast     HPI: 37 y.o. year old female presents with above.   Reports that the person she has been in a relationship with --- she recently found out has been with someone else recently. Therefore she is wanting full STD screen.  Also she has been having some vaginal discharge so wants to check for infections regarding that.  Also she has some rash under her breasts.  No other concerns to address today.     Home Meds:   Outpatient Medications Prior to Visit  Medication Sig Dispense Refill  . gabapentin (NEURONTIN) 600 MG tablet TAKE 1/2 TABLET(S) BY MOUTH TWICE A DAILY PRN FOR ANXIETY/MOOD.  1  . omeprazole (PRILOSEC) 20 MG capsule Take 20 mg by mouth daily as needed (indigestion).     . Vitamin D, Cholecalciferol, 400 units CAPS Take 800 Units by mouth daily.     No facility-administered medications prior to visit.     Allergies:  Allergies  Allergen Reactions  . Latex Rash    Reaction to gloves      Review of Systems: See HPI for pertinent ROS. All other ROS negative.    Physical Exam: Blood pressure 100/82, pulse (!) 101, temperature 98.4 F (36.9 C), temperature source Oral, resp. rate 18, height 5\' 8"  (1.727 m), weight 130.2 kg, SpO2 98 %., Body mass index is 43.64 kg/m. General: WF.   Appears in no acute distress. Neck: Supple. No thyromegaly. No lymphadenopathy. Lungs: Clear bilaterally to auscultation without wheezes, rales, or rhonchi. Breathing is unlabored. Heart: Regular rhythm. No murmurs, rubs, or gallops. Msk:  Strength and tone normal for age. Pelvic Exam: External genitalia normal.  Vaginal mucosa appears normal.  Cervix appears normal.  No cervical motion tenderness.  No adnexal mass. Skin: Has some light pink diffuse erythema  and skin folds under breasts bilaterally.  Mild. Neuro: Alert and oriented X 3. Moves all extremities spontaneously. Gait is normal. CNII-XII grossly in tact. Psych:  Responds to questions appropriately with a normal affect.     ASSESSMENT AND PLAN:  37 y.o. year old female with    1. Vaginitis and vulvovaginitis - C. trachomatis/N. gonorrhoeae RNA - WET PREP FOR TRICH, YEAST, CLUE  2. Vaginal candidiasis Wet Prep shows yeast but otherwise is normal.  Will treat with Diflucan. - fluconazole (DIFLUCAN) 150 MG tablet; Take 1 tablet (150 mg total) by mouth once for 1 dose.  Dispense: 1 tablet; Refill: 0  3. Screening for STDs (sexually transmitted diseases) Will send full STD screen.  Also gonorrhea chlamydia and wet prep are listed under #1 above. - Hepatitis panel, acute - HIV antibody - HSV(herpes smplx)abs-1+2(IgG+IgM)-bld - RPR - HSV(herpes simplex vrs) 1+2 ab-IgG  4. Candidiasis of breast The Diflucan that is prescribed #2 will help with this.  Also will give nystatin powder to use.  Discussed trying to avoid moisture and sweat and skin folds and change to dry clothing as frequently as possible. - nystatin (MYCOSTATIN/NYSTOP) powder; Apply topically 4 (four) times daily.  Dispense: 15 g; Refill: 0     Signed, [address]Jerrie Schussler Beth LyonsDixon, [place], [facility] 07/11/2018 4:02 PM

## 2018-07-12 LAB — C. TRACHOMATIS/N. GONORRHOEAE RNA
C. trachomatis RNA, TMA: NOT DETECTED
N. gonorrhoeae RNA, TMA: NOT DETECTED

## 2018-07-12 LAB — HEPATITIS PANEL, ACUTE
HEP B C IGM: NONREACTIVE
HEP B S AG: NONREACTIVE
HEP C AB: NONREACTIVE
Hep A IgM: NONREACTIVE
SIGNAL TO CUT-OFF: 0.02 (ref <1.00)

## 2018-07-12 LAB — HIV ANTIBODY (ROUTINE TESTING W REFLEX): HIV: NONREACTIVE

## 2018-07-12 LAB — HSV(HERPES SIMPLEX VRS) I + II AB-IGG
HAV 1 IGG,TYPE SPECIFIC AB: <0.9 index
HSV 2 IGG,TYPE SPECIFIC AB: <0.9 index

## 2018-07-12 LAB — RPR: RPR: NONREACTIVE

## 2018-07-16 ENCOUNTER — Telehealth: Payer: Self-pay

## 2018-07-16 NOTE — Telephone Encounter (Signed)
Recommend over-the-counter treatment for residual symptoms--  Topical cream to apply to vaginal area--for  Yeast.

## 2018-07-16 NOTE — Telephone Encounter (Signed)
Call was placed to patient to discuss her results, Patient states she took the diflucan that was prescribed at her last office visit on 8/14 and is still having vaginal irritation and would like something called in to help with the symptoms. Pls advise

## 2018-07-16 NOTE — Telephone Encounter (Signed)
Call placed to patient she is aware of provider recommendations  

## 2018-08-01 ENCOUNTER — Ambulatory Visit (INDEPENDENT_AMBULATORY_CARE_PROVIDER_SITE_OTHER): Payer: Medicaid Other

## 2018-08-01 ENCOUNTER — Ambulatory Visit (HOSPITAL_COMMUNITY)
Admission: EM | Admit: 2018-08-01 | Discharge: 2018-08-01 | Disposition: A | Discharge status: Home / Self Care | Payer: Medicaid Other | Attending: Family Medicine | Admitting: Family Medicine

## 2018-08-01 ENCOUNTER — Encounter (HOSPITAL_COMMUNITY): Payer: Self-pay | Admitting: Emergency Medicine

## 2018-08-01 DIAGNOSIS — S99921A Unspecified injury of right foot, initial encounter: Secondary | ICD-10-CM

## 2018-08-01 DIAGNOSIS — W228XXA Striking against or struck by other objects, initial encounter: Secondary | ICD-10-CM

## 2018-08-01 MED ORDER — NAPROXEN 500 MG PO TABS: 500.0000 mg | ORAL_TABLET | Freq: Two times a day (BID) | ORAL | 0 refills | Status: DC

## 2018-08-01 NOTE — ED Triage Notes (Signed)
Pt sts injured right 4th toe 3 days ago and still having pain

## 2018-08-01 NOTE — Medical Student Note (Signed)
[name], PA and NP students only and not part of the legal medical record.   CSN: [id number] Arrival date & time: 08/01/18  1021     History   Chief Complaint Chief Complaint  Patient presents with  . Toe Injury    HPI [name] is a 37 y.o. female. Pt presents complaining of...  O- about a week ago she woke up in the middle of the night & stubbed her toe on the bedroom doorframe L- 4th metatarsal D- hurts when body weight is applied to it  C- combo of achy/throbbing  A- weight bearing activities, [name]   Past Medical History:  Diagnosis Date  . Anxiety   . Asthma   . Depression   . Ectopic pregnancy   . Irritable bowel syndrome (IBS) .  Marland Kitchen PTSD (post-traumatic stress disorder)     Patient Active Problem List   Diagnosis Date Noted  . GERD (gastroesophageal reflux disease) 06/08/2017  . MDD (major depressive disorder), recurrent episode, severe (HCC) 11/16/2015  . Marijuana abuse     Past Surgical History:  Procedure Laterality Date  . TONSILLECTOMY    . TUBAL LIGATION      OB History   None      Home Medications    Prior to Admission medications   Medication Sig Start Date End Date Taking? Authorizing Provider  gabapentin (NEURONTIN) 600 MG tablet TAKE 1/2 TABLET(S) BY MOUTH TWICE A DAILY PRN FOR ANXIETY/MOOD. 02/16/18   [provider]  naproxen (NAPROSYN) 500 MG tablet Take 1 tablet (500 mg total) by mouth 2 (two) times daily with a meal. 08/01/18   Mardella Layman, MD  nystatin (MYCOSTATIN/NYSTOP) powder Apply topically 4 (four) times daily. 07/11/18   Dorena Bodo, PA-C  omeprazole (PRILOSEC) 20 MG capsule Take 20 mg by mouth daily as needed (indigestion).     [provider]  Vitamin D, Cholecalciferol, 400 units CAPS Take 800 Units by mouth daily.    [provider]    Family  History History reviewed. No pertinent family history.  Social History Social History   Tobacco Use  . Smoking status: Former Smoker    Packs/day: 0.50    Types: Cigarettes  . Smokeless tobacco: Never Used  Substance Use Topics  . Alcohol use: Yes    Comment: social  . Drug use: Not Currently    Types: Marijuana     Allergies   Latex   Review of Systems Review of Systems  Constitutional: Positive for activity change.  Musculoskeletal: Positive for joint swelling.       Complaints of right 4th metatarsal pain and swelling. States there was bruising initially.   Skin: Negative for color change.     Physical Exam Updated Vital Signs BP 107/76 (BP Location: Left Arm)   Pulse 72   Temp 98.2 F (36.8 C) (Oral)   Resp 16   LMP 07/12/2018 (Exact Date) Comment: tubligation   SpO2 100%   Physical Exam  Constitutional: She appears well-developed and well-nourished.  Musculoskeletal:       Feet:  Skin: Skin is warm and intact. No bruising and no ecchymosis noted. No erythema.  Nursing note and vitals reviewed.    ED Treatments / Results  Labs (all labs ordered are listed, but only abnormal results are displayed) Labs Reviewed - No data to display  EKG None Radiology No results found.  Procedures Procedures (including critical care time)  Medications Ordered in ED Medications - No data to display   Initial Impression / Assessment and Plan / ED Course  I have reviewed the triage vital signs and the nursing notes.  Pertinent labs & imaging results that were available during my care of the patient were reviewed by me and considered in my medical decision making (see chart for details).     Injury to R foot  Xray ordered. No signs of fracture.  RICE Buddy tape to right 4th/5th metatarsal. Refused walking boot.  WBAT to R foot  Work note provided for 3 days out of work.  Naproxen 500 mg daily with meal  Final Clinical Impressions(s) / ED Diagnoses    Final diagnoses:  Injury of right foot, initial encounter    New Prescriptions New Prescriptions   NAPROXEN (NAPROSYN) 500 MG TABLET    Take 1 tablet (500 mg total) by mouth 2 (two) times daily with a meal.

## 2018-08-01 NOTE — ED Provider Notes (Signed)
[facility]   [id number] 08/01/18 Arrival Time: 1021  ASSESSMENT & PLAN:  1. Injury of right foot, initial encounter     Imaging: Dg Foot Complete Right  Result Date: 08/01/2018 CLINICAL DATA:  Right fourth ray pain after injury EXAM: RIGHT FOOT COMPLETE - 3+ VIEW COMPARISON:  None. FINDINGS: There is no evidence of fracture or dislocation. There is no evidence of arthropathy or other focal bone abnormality. Soft tissues are unremarkable. IMPRESSION: No fracture or dislocation in the right foot. Electronically Signed   By: Delbert Phenix M.D.   On: 08/01/2018 12:05   Meds ordered this encounter  Medications  . naproxen (NAPROSYN) 500 MG tablet    Sig: Take 1 tablet (500 mg total) by mouth 2 (two) times daily with a meal.    Dispense:  20 tablet    Refill:  0    Follow-up Information    Donita Brooks, MD.   Specialty:  Family Medicine Why:  As needed. Contact information: [address] [place] [address] [phone]          Prefers buddy-taping toes over cast shoe. NSAID as above. Work note given.  Reviewed expectations re: course of current medical issues. Questions answered. Outlined signs and symptoms indicating need for more acute intervention. Patient verbalized understanding. After Visit Summary given.  SUBJECTIVE: History from: patient. [name] is a 37 y.o. female who reports persistent mild to moderate pain of her right foot (mostly over 4th toe) that has stabilized since beginning; described as aching without radiation. Onset: abrupt, 3-4 days ago. Injury/trama: yes, reports hitting toe on something. Relieved by: rest. Worsened by: weight bearing. Associated symptoms: none reported. Extremity sensation changes or weakness: none. Self treatment: has not tried OTCs for relief of pain. History of similar: no  ROS: As per HPI.   OBJECTIVE:  Vitals:   08/01/18 1048  BP: 107/76  Pulse: 72  Resp: 16  Temp: 98.2 F (36.8  C)  TempSrc: Oral  SpO2: 100%    General appearance: alert; no distress Extremities: warm and well perfused; symmetrical with no gross deformities; localized tenderness over her right 4th toe extending toward 4th metatarsal with mild swelling and no bruising; ROM around area or areas of discomfort: normal CV: brisk extremity capillary refill Skin: warm and dry Neurologic: normal gait; normal symmetric reflexes in all extremities; normal sensation in all extremities Psychological: alert and cooperative; normal mood and affect  Allergies  Allergen Reactions  . Latex Rash    Reaction to gloves    Past Medical History:  Diagnosis Date  . Anxiety   . Asthma   . Depression   . Ectopic pregnancy   . Irritable bowel syndrome (IBS) .  Marland Kitchen PTSD (post-traumatic stress disorder)    Social History   Socioeconomic History  . Marital status: Single    Spouse name: Not on file  . Number of children: Not on file  . Years of education: Not on file  . Highest education level: Not on file  Occupational History  . Not on file  Social Needs  . Financial resource strain: Not on file  . Food insecurity:    Worry: Not on file    Inability: Not on file  . Transportation needs:    Medical: Not on file    Non-medical: Not on file  Tobacco Use  . Smoking status: Former Smoker    Packs/day: 0.50    Types: Cigarettes  . Smokeless tobacco:  Never Used  Substance and Sexual Activity  . Alcohol use: Yes    Comment: social  . Drug use: Not Currently    Types: Marijuana  . Sexual activity: Yes    Birth control/protection: None  Lifestyle  . Physical activity:    Days per week: Not on file    Minutes per session: Not on file  . Stress: Not on file  Relationships  . Social connections:    Talks on phone: Not on file    Gets together: Not on file    Attends religious service: Not on file    Active member of club or organization: Not on file    Attends meetings of clubs or organizations: Not  on file    Relationship status: Not on file  Other Topics Concern  . Not on file  Social History Narrative  . Not on file   History reviewed. No pertinent family history. Past Surgical History:  Procedure Laterality Date  . TONSILLECTOMY    . TUBAL LIGATION        Mardella Layman, MD 08/01/18 1212

## 2018-08-02 ENCOUNTER — Ambulatory Visit: Payer: Medicaid Other | Admitting: Physician Assistant

## 2018-08-07 ENCOUNTER — Encounter: Payer: Self-pay | Admitting: Family Medicine

## 2018-12-05 ENCOUNTER — Encounter (HOSPITAL_COMMUNITY): Payer: Self-pay

## 2018-12-05 ENCOUNTER — Emergency Department (HOSPITAL_COMMUNITY): Payer: Medicaid Other

## 2018-12-05 ENCOUNTER — Emergency Department (HOSPITAL_COMMUNITY)
Admission: EM | Admit: 2018-12-05 | Discharge: 2018-12-06 | Disposition: A | Discharge status: Home / Self Care | Payer: Medicaid Other | Attending: Emergency Medicine | Admitting: Emergency Medicine

## 2018-12-05 DIAGNOSIS — E876 Hypokalemia: Secondary | ICD-10-CM | POA: Diagnosis not present

## 2018-12-05 DIAGNOSIS — Z91040 Latex allergy status: Secondary | ICD-10-CM | POA: Insufficient documentation

## 2018-12-05 DIAGNOSIS — R0789 Other chest pain: Secondary | ICD-10-CM | POA: Diagnosis not present

## 2018-12-05 DIAGNOSIS — G245 Blepharospasm: Secondary | ICD-10-CM

## 2018-12-05 DIAGNOSIS — R079 Chest pain, unspecified: Secondary | ICD-10-CM | POA: Diagnosis present

## 2018-12-05 DIAGNOSIS — Z79899 Other long term (current) drug therapy: Secondary | ICD-10-CM | POA: Diagnosis not present

## 2018-12-05 DIAGNOSIS — Z87891 Personal history of nicotine dependence: Secondary | ICD-10-CM | POA: Diagnosis not present

## 2018-12-05 DIAGNOSIS — J45909 Unspecified asthma, uncomplicated: Secondary | ICD-10-CM | POA: Insufficient documentation

## 2018-12-05 DIAGNOSIS — H5789 Other specified disorders of eye and adnexa: Secondary | ICD-10-CM | POA: Insufficient documentation

## 2018-12-05 LAB — CBC
HEMATOCRIT: 38 % (ref 36.0–46.0)
HEMOGLOBIN: 12.3 g/dL (ref 12.0–15.0)
MCH: 28.3 pg (ref 26.0–34.0)
MCHC: 32.4 g/dL (ref 30.0–36.0)
MCV: 87.4 fL (ref 80.0–100.0)
NRBC: 0 % (ref 0.0–0.2)
Platelets: 205 10*3/uL (ref 150–400)
RBC: 4.35 MIL/uL (ref 3.87–5.11)
RDW: 12.7 % (ref 11.5–15.5)
WBC: 8.4 10*3/uL (ref 4.0–10.5)

## 2018-12-05 LAB — BASIC METABOLIC PANEL
Anion gap: 9 (ref 5–15)
BUN: 10 mg/dL (ref 6–20)
CHLORIDE: 104 mmol/L (ref 98–111)
CO2: 26 mmol/L (ref 22–32)
Calcium: 8.8 mg/dL — ABNORMAL LOW (ref 8.9–10.3)
Creatinine, Ser: 0.85 mg/dL (ref 0.44–1.00)
GFR calc non Af Amer: >60 mL/min (ref >60)
Glucose, Bld: 99 mg/dL (ref 70–99)
POTASSIUM: 3.4 mmol/L — AB (ref 3.5–5.1)
Sodium: 139 mmol/L (ref 135–145)

## 2018-12-05 LAB — I-STAT BETA HCG BLOOD, ED (MC, WL, AP ONLY)

## 2018-12-05 LAB — I-STAT TROPONIN, ED: Troponin i, poc: 0 ng/mL (ref 0.00–0.08)

## 2018-12-05 NOTE — ED Triage Notes (Signed)
Pt reports central chest pain and right eye twitching for 1 week. Describes chest pain as pressure. Pt a.o, nad noted

## 2018-12-06 ENCOUNTER — Emergency Department (HOSPITAL_COMMUNITY): Payer: Medicaid Other

## 2018-12-06 LAB — MAGNESIUM: Magnesium: 2.1 mg/dL (ref 1.7–2.4)

## 2018-12-06 LAB — D-DIMER, QUANTITATIVE (NOT AT ARMC): D DIMER QUANT: 0.53 ug{FEU}/mL — AB (ref 0.00–0.50)

## 2018-12-06 MED ORDER — IOPAMIDOL (ISOVUE-370) INJECTION 76%
100.0000 mL | Freq: Once | INTRAVENOUS | Status: AC | PRN
  Administered 2018-12-06: 100 mL via INTRAVENOUS

## 2018-12-06 MED ORDER — CYCLOBENZAPRINE HCL 10 MG PO TABS: 10.0000 mg | ORAL_TABLET | Freq: Three times a day (TID) | ORAL | 0 refills | Status: DC | PRN

## 2018-12-06 MED ORDER — POTASSIUM CHLORIDE CRYS ER 20 MEQ PO TBCR
40.0000 meq | EXTENDED_RELEASE_TABLET | Freq: Once | ORAL | Status: AC
  Administered 2018-12-06: 40 meq via ORAL
  Filled 2018-12-06: qty 2

## 2018-12-06 MED ORDER — POTASSIUM CHLORIDE CRYS ER 20 MEQ PO TBCR: 20.0000 meq | EXTENDED_RELEASE_TABLET | Freq: Two times a day (BID) | ORAL | 0 refills | Status: DC

## 2018-12-06 MED ORDER — IOPAMIDOL (ISOVUE-370) INJECTION 76%
INTRAVENOUS | Status: AC
  Filled 2018-12-06: qty 100

## 2018-12-06 NOTE — ED Provider Notes (Signed)
[facility]   CSN: [id number] Arrival date & time: 12/05/18  2002     History   Chief Complaint Chief Complaint  Patient presents with  . Chest Pain  . Eye Problem    HPI [name] is a 38 y.o. female.  The history is provided by the patient.  Chest Pain  Eye Problem  She has history of asthma, anxiety, depression, GERD and comes in complaining of some intermittent chest discomfort and twitching of her right eye.  Symptoms of been present for the last week and has been getting worse.  Today, she notes that her eye has been twitching almost constantly.  She denies any difficulty with her vision denies any pain.  Chest discomfort is described as a heavy feeling which comes on mainly with times of stress.  It will last 2 to 3 minutes before resolving.  There is associated dyspnea, nausea, diaphoresis.  There has been no exertional component.  She is also concerned that there were 2 occasions in the last month where she woke up with sense that she could not breathe.  She is a former smoker having quit 18 months ago.  She denies history of hypertension, diabetes, hyperlipidemia.  There is no family history of premature coronary atherosclerosis.  She is not on birth control pills and denies recent surgery or travel.  She does admit to marijuana use.  Past Medical History:  Diagnosis Date  . Anxiety   . Asthma   . Depression   . Ectopic pregnancy   . Irritable bowel syndrome (IBS) .  Marland Kitchen. PTSD (post-traumatic stress disorder)     Patient Active Problem List   Diagnosis Date Noted  . GERD (gastroesophageal reflux disease) 06/08/2017  . MDD (major depressive disorder), recurrent episode, severe (HCC) 11/16/2015  . Marijuana abuse     Past Surgical History:  Procedure Laterality Date  . TONSILLECTOMY    . TUBAL LIGATION       OB History   No obstetric history on file.      Home Medications    Prior to Admission  medications   Medication Sig Start Date End Date Taking? Authorizing Provider  gabapentin (NEURONTIN) 600 MG tablet TAKE 1/2 TABLET(S) BY MOUTH TWICE A DAILY PRN FOR ANXIETY/MOOD. 02/16/18   [provider]  naproxen (NAPROSYN) 500 MG tablet Take 1 tablet (500 mg total) by mouth 2 (two) times daily with a meal. 08/01/18   Mardella LaymanHagler, Brian, MD  nystatin (MYCOSTATIN/NYSTOP) powder Apply topically 4 (four) times daily. 07/11/18   Dorena Bodoixon, Mary B, PA-C  omeprazole (PRILOSEC) 20 MG capsule Take 20 mg by mouth daily as needed (indigestion).     [provider]  Vitamin D, Cholecalciferol, 400 units CAPS Take 800 Units by mouth daily.    [provider]    Family History No family history on file.  Social History Social History   Tobacco Use  . Smoking status: Former Smoker    Packs/day: 0.50    Types: Cigarettes  . Smokeless tobacco: Never Used  Substance Use Topics  . Alcohol use: Yes    Comment: social  . Drug use: Not Currently    Types: Marijuana     Allergies   Latex   Review of Systems Review of Systems  Cardiovascular: Positive for chest pain.  All other systems reviewed and are negative.    Physical Exam Updated Vital Signs BP (!) 122/96 (BP Location: Right Arm)   Pulse  74   Temp 98.2 F (36.8 C) (Oral)   Resp 16   LMP 12/04/2018   SpO2 98%   Physical Exam Vitals signs and nursing note reviewed.    38 year old female, resting comfortably and in no acute distress. Vital signs are significant for elevated diastolic blood pressure. Oxygen saturation is 98%, which is normal. Head is normocephalic and atraumatic. PERRLA, EOMI. Oropharynx is clear. Neck is nontender and supple without adenopathy or JVD. Back is nontender and there is no CVA tenderness. Lungs are clear without rales, wheezes, or rhonchi. Chest is nontender. Heart has regular rate and rhythm without murmur. Abdomen is soft, flat, nontender without masses or hepatosplenomegaly  and peristalsis is normoactive. Extremities have no cyanosis or edema, full range of motion is present. Skin is warm and dry without rash. Neurologic: Mental status is normal, cranial nerves are intact, there are no motor or sensory deficits.  ED Treatments / Results  Labs (all labs ordered are listed, but only abnormal results are displayed) Labs Reviewed  BASIC METABOLIC PANEL - Abnormal; Notable for the following components:      Result Value   Potassium 3.4 (*)    Calcium 8.8 (*)    All other components within normal limits  D-DIMER, QUANTITATIVE (NOT AT [facility]) - Abnormal; Notable for the following components:   D-Dimer, Quant 0.53 (*)    All other components within normal limits  CBC  MAGNESIUM  I-STAT TROPONIN, ED  I-STAT BETA HCG BLOOD, ED ([facility], [facility], [facility] ONLY)    EKG EKG Interpretation  Date/Time:  Wednesday December 05 2018 20:08:35 EST Ventricular Rate:  85 PR Interval:  150 QRS Duration: 86 QT Interval:  376 QTC Calculation: 447 R Axis:   48 Text Interpretation:  Normal sinus rhythm Normal ECG When compared with ECG of 05/23/2018, No significant change was found Confirmed by Dione BoozeGlick, Chlora Mcbain ([id number]) on 12/06/2018 12:33:20 AM   Radiology Dg Chest 2 View  Result Date: 12/05/2018 CLINICAL DATA:  Chest pain EXAM: CHEST - 2 VIEW COMPARISON:  12/16/2016 chest radiograph. FINDINGS: Stable cardiomediastinal silhouette with normal heart size. No pneumothorax. No pleural effusion. Lungs appear clear, with no acute consolidative airspace disease and no pulmonary edema. IMPRESSION: No active cardiopulmonary disease. Electronically Signed   By: Delbert PhenixJason A Poff M.D.   On: 12/05/2018 20:40   Ct Angio Chest Pe W And/or Wo Contrast  Result Date: 12/06/2018 CLINICAL DATA:  Central chest pain x1 week with asthma. EXAM: CT ANGIOGRAPHY CHEST WITH CONTRAST TECHNIQUE: Multidetector CT imaging of the chest was performed using the standard protocol during bolus administration of intravenous contrast.  Multiplanar CT image reconstructions and MIPs were obtained to evaluate the vascular anatomy. CONTRAST:  65 cc ISOVUE-370 IOPAMIDOL (ISOVUE-370) INJECTION 76% COMPARISON:  CXR 12/05/2018 FINDINGS: Cardiovascular: The study is of quality for the evaluation of pulmonary embolism. There are no filling defects in the central, lobar, segmental or subsegmental pulmonary artery branches to suggest acute pulmonary embolism. Great vessels are normal in course and caliber. Normal heart size. No significant pericardial fluid/thickening. Nonaneurysmal thoracic aorta without dissection Mediastinum/Nodes: No discrete thyroid nodules. Unremarkable esophagus. No pathologically enlarged axillary, mediastinal or hilar lymph nodes. Lungs/Pleura: No pneumothorax. No pleural effusion. Slight mosaic attenuation of the lungs bilaterally. Upper abdomen: Unremarkable. Musculoskeletal:  No aggressive appearing focal osseous lesions. Review of the MIP images confirms the above findings. IMPRESSION: 1. No acute pulmonary embolus. 2. Slight mosaic attenuation of the lungs bilaterally which can be seen in the setting of asthma or  small airway disease/inflammation. Electronically Signed   By: Tollie Eth M.D.   On: 12/06/2018 03:51    Procedures Procedures   Medications Ordered in ED Medications  potassium chloride SA (K-DUR,KLOR-CON) CR tablet 40 mEq (40 mEq Oral Given 12/06/18 0245)  iopamidol (ISOVUE-370) 76 % injection 100 mL (100 mLs Intravenous Contrast Given 12/06/18 0309)     Initial Impression / Assessment and Plan / ED Course  I have reviewed the triage vital signs and the nursing notes.  Pertinent labs & imaging results that were available during my care of the patient were reviewed by me and considered in my medical decision making (see chart for details).  Chest discomfort of uncertain cause.  Heart score is 0, which puts her at low risk for major adverse cardiac events in the next 6 weeks.  ECG is normal and chest  x-ray is normal.  Labs are significant for mild hypokalemia.  This could conceivably be related to her eye twitching.  We will also check her magnesium level.  She is given a dose of oral potassium.  Although no risk factors present, will screen for pulmonary embolism with d-dimer.  Old records are reviewed, and she has 2 prior ED visits for chest pain.  D-dimer has come back borderline positive.  She is in for CT angiogram of the chest which showed no evidence of pulmonary embolism.  She is reassured about this finding.  She is discharged with prescription for K-Dur, advised she use warm compresses for her right eye when it is twitching.  We will also give a trial of cyclobenzaprine to use if needed.  Follow-up with PCP.  Importance of adequate sleep was stressed.  Final Clinical Impressions(s) / ED Diagnoses   Final diagnoses:  Atypical chest pain  Hypokalemia  Eye twitch    ED Discharge Orders         Ordered    cyclobenzaprine (FLEXERIL) 10 MG tablet  3 times daily PRN     12/06/18 0414    potassium chloride SA (K-DUR,KLOR-CON) 20 MEQ tablet  2 times daily     12/06/18 0414           Dione Booze, MD 12/06/18 [phone]

## 2018-12-06 NOTE — ED Notes (Signed)
Patient verbalizes understanding of discharge instructions. Opportunity for questioning and answers were provided. Armband removed by staff, pt discharged from ED home via POV.  

## 2018-12-06 NOTE — Discharge Instructions (Signed)
Try to get enough sleep.  Apply warm compresses to your eye when it is twitching.

## 2018-12-06 NOTE — ED Notes (Signed)
Pt complains of chest pressure for 1 week with eye twitching.

## 2019-02-26 ENCOUNTER — Telehealth: Payer: Self-pay | Admitting: Family Medicine

## 2019-02-26 NOTE — Telephone Encounter (Signed)
Pt called [facility] stating that she thinks she had a sinus infection and would like something called in for this. Called pt back to inform her that we could do a telephone visit - had to leave message on vm to call back and speak to front office staff.

## 2019-02-27 ENCOUNTER — Ambulatory Visit (INDEPENDENT_AMBULATORY_CARE_PROVIDER_SITE_OTHER): Payer: Medicaid Other | Admitting: Family Medicine

## 2019-02-27 ENCOUNTER — Other Ambulatory Visit: Payer: Self-pay

## 2019-02-27 ENCOUNTER — Encounter: Payer: Self-pay | Admitting: Family Medicine

## 2019-02-27 DIAGNOSIS — J00 Acute nasopharyngitis [common cold]: Secondary | ICD-10-CM | POA: Diagnosis not present

## 2019-02-27 MED ORDER — LEVOCETIRIZINE DIHYDROCHLORIDE 5 MG PO TABS: 5.0000 mg | ORAL_TABLET | Freq: Every evening | ORAL | 1 refills | Status: DC

## 2019-02-27 MED ORDER — FLUTICASONE PROPIONATE 50 MCG/ACT NA SUSP: 2.0000 | Freq: Every day | NASAL | 6 refills | Status: DC

## 2019-02-27 MED ORDER — AMOXICILLIN-POT CLAVULANATE 875-125 MG PO TABS: 1.0000 | ORAL_TABLET | Freq: Two times a day (BID) | ORAL | 0 refills | Status: AC

## 2019-02-27 NOTE — Progress Notes (Signed)
Virtual Visit via Telephone Note  Appt arranged with  Valene Bors for 02/27/19 at 10:45 AM EDT Services provided today were via telemedicine through telephone call.  Patient reported their location during encounter was at home  Patient consented to telephone visit  I conducted telephone visit from [facility]  Referring Provider:   Donita Brooks, MD   Start of phone call:  11:00 AM  Contacted by telephone, verified that I am speaking with the correct person using two identifiers.   I discussed the limitations, risks, security and privacy concerns of performing an evaluation and management service by telephone and the availability of in person appointments. I also discussed with the patient that there may be a patient responsible charge related to this service. The patient expressed understanding and agreed to proceed.   All participants in encounter:  Danelle Berry, PA-C and pt   History of Present Illness:  3-4 days of URI/nasal sx Cold like sx, nasal congestion, drainage and post-nasal drip Pressure behind eyes with a little headache Has hx of seasonal allergies, but not on anything right now Sx are worse when she goes outside even to walk her dog right now, her head starts hurting and eyes throb more, also when going outside recently with weather changes her "breathings not good" and when asked to describe what that means she says its like a "smokers lung or something"  As a child had allergy shots and inhalers, but she's not had breathing issues (wheeze/SOB) or much seasonal allergy issues as an adult Former smoker  --  Will be 1 years in May that she has not smoked But had many years of smoking before that  No fever, no sweats, CP, sore throat, cough, wheeze No recent travel and unknown if any sick contacts or contacts with possible COVID-19    Observations/Objective: Limited due to telephone encounter No audible wheeze, no stridor, voice slightly  raspy, able to speak in full and complete sentences.  Heard to "sniff" often  Assessment and Plan:    ICD-10-CM   1. Acute rhinitis J00 levocetirizine (XYZAL) 5 MG tablet    fluticasone (FLONASE) 50 MCG/ACT nasal spray    amoxicillin-clavulanate (AUGMENTIN) 875-125 MG tablet   sounds allergic, tx allergies, if prolonged or new fever start antibiotic  Through phone visit cannot definitely r/o viral etiology of COVID - advised to self isolate if feeling ill or with fever. The given hx is very suggestive of nasal sx, pressure and possibly the mild respiratory sx being allergy mediated  Abx post-dated - can start if bothersome sx for more than 8-10 days, or can start if facial pain with tenderness to the touch of cheeks or forehead and/or fever  Would still continue allergy meds whether or not she needs abx. Follow Up Instructions:    I discussed the assessment and treatment plan with the patient. The patient was provided an opportunity to ask questions and all were answered. The patient agreed with the plan and demonstrated an understanding of the instructions.   The patient was advised to call back or seek an in-person evaluation if the symptoms worsen or if the condition fails to improve as anticipated.   11:17 AM I provided 17 minutes of non-face-to-face time during this encounter.   Danelle Berry, PA-C

## 2019-03-16 ENCOUNTER — Other Ambulatory Visit: Payer: Self-pay | Admitting: Family Medicine

## 2019-07-26 ENCOUNTER — Ambulatory Visit (INDEPENDENT_AMBULATORY_CARE_PROVIDER_SITE_OTHER): Payer: Medicaid Other | Admitting: Family Medicine

## 2019-07-26 ENCOUNTER — Encounter: Payer: Self-pay | Admitting: Family Medicine

## 2019-07-26 ENCOUNTER — Other Ambulatory Visit: Payer: Self-pay

## 2019-07-26 VITALS — BP 130/78 | HR 76 | Temp 98.1°F | Resp 16 | Ht 68.0 in | Wt 311.0 lb

## 2019-07-26 DIAGNOSIS — M25562 Pain in left knee: Secondary | ICD-10-CM | POA: Diagnosis not present

## 2019-07-26 DIAGNOSIS — J019 Acute sinusitis, unspecified: Secondary | ICD-10-CM | POA: Diagnosis not present

## 2019-07-26 DIAGNOSIS — K219 Gastro-esophageal reflux disease without esophagitis: Secondary | ICD-10-CM | POA: Diagnosis not present

## 2019-07-26 DIAGNOSIS — R42 Dizziness and giddiness: Secondary | ICD-10-CM

## 2019-07-26 DIAGNOSIS — B9689 Other specified bacterial agents as the cause of diseases classified elsewhere: Secondary | ICD-10-CM

## 2019-07-26 MED ORDER — LEVOCETIRIZINE DIHYDROCHLORIDE 5 MG PO TABS: 5.0000 mg | ORAL_TABLET | Freq: Every evening | ORAL | 0 refills | Status: DC

## 2019-07-26 MED ORDER — PANTOPRAZOLE SODIUM 40 MG PO TBEC: 40.0000 mg | DELAYED_RELEASE_TABLET | Freq: Two times a day (BID) | ORAL | 3 refills | Status: DC

## 2019-07-26 MED ORDER — FLUCONAZOLE 150 MG PO TABS: 150.0000 mg | ORAL_TABLET | Freq: Once | ORAL | 0 refills | Status: AC

## 2019-07-26 MED ORDER — AMOXICILLIN-POT CLAVULANATE 875-125 MG PO TABS: 1.0000 | ORAL_TABLET | Freq: Two times a day (BID) | ORAL | 0 refills | Status: DC

## 2019-07-26 MED ORDER — CLOTRIMAZOLE-BETAMETHASONE 1-0.05 % EX CREA: 1.0000 "application " | TOPICAL_CREAM | Freq: Two times a day (BID) | CUTANEOUS | 0 refills | Status: DC

## 2019-07-26 MED ORDER — MELOXICAM 15 MG PO TABS: 15.0000 mg | ORAL_TABLET | Freq: Every day | ORAL | 2 refills | Status: DC

## 2019-07-26 NOTE — Progress Notes (Signed)
Subjective:    Patient ID: [name], female    DOB: [birth year], 38 y.o.   MRN: [id number]  HPI Patient presents with a multitude of complaints today.  First she reports pressure-like pain in her frontal and maxillary sinuses.  This is gone on now for several months.  It seems to be triggered by allergies.  Whenever she becomes congested they hurt and ache.  Over the last few weeks they have contributed to dizziness.  If she stands up quickly she will feel very lightheaded.  She also feels off balance at times.  She reports constant pain and pressure in her maxillary and frontal sinuses.  She reports tenderness to percussion over her maxillary and frontal sinuses today.  She has been taking Flonase which is helped some with the symptoms but has not alleviated them.  She also complains of a rash in the crease between her thighs and her perineum.  There is an erythematous scaly rash in that area.  It gets worse when she sweats and when it is hot.  She is tried nystatin cream with no relief.  The rash gets better if she keeps the area dry.  She also complains of pain in her knees primarily her left knee.  The pain is located under the patella.  Hurts when she rises from a squat.  Hurts when she goes up steps.  It hurts when she walks for prolonged period of time.  Meloxicam in the past has helped the symptoms however she ran out.  She denies any erythema or effusion.  Previous x-ray has been unremarkable.  Previously thought that she had patellofemoral syndrome and early osteoarthritis contributed to by obesity.  She also complains of worsening acid reflux.  She is taking Protonix 20 mg a day with very little relief.  She reports indigestion refractory to this that occurs on a daily basis. Past Medical History:  Diagnosis Date  . Anxiety   . Asthma   . Depression   . Ectopic pregnancy   . Irritable bowel syndrome (IBS) .  Marland Kitchen PTSD (post-traumatic stress disorder)    Past Surgical History:   Procedure Laterality Date  . TONSILLECTOMY    . TUBAL LIGATION     Current Outpatient Medications on File Prior to Visit  Medication Sig Dispense Refill  . fluticasone (FLONASE) 50 MCG/ACT nasal spray Place 2 sprays into both nostrils daily. 16 g 6  . omeprazole (PRILOSEC) 20 MG capsule TAKE 1 CAPSULE (20 MG TOTAL) BY MOUTH DAILY AS NEEDED. 30 capsule 11   No current facility-administered medications on file prior to visit.    Allergies  Allergen Reactions  . Latex Rash    Reaction to gloves   Social History   Socioeconomic History  . Marital status: Single    Spouse name: Not on file  . Number of children: Not on file  . Years of education: Not on file  . Highest education level: Not on file  Occupational History  . Not on file  Social Needs  . Financial resource strain: Not on file  . Food insecurity    Worry: Not on file    Inability: Not on file  . Transportation needs    Medical: Not on file    Non-medical: Not on file  Tobacco Use  . Smoking status: Former Smoker    Packs/day: 0.50    Types: Cigarettes  . Smokeless tobacco: Never Used  Substance and Sexual Activity  . Alcohol use: Yes  Comment: social  . Drug use: Not Currently    Types: Marijuana  . Sexual activity: Yes    Birth control/protection: None  Lifestyle  . Physical activity    Days per week: Not on file    Minutes per session: Not on file  . Stress: Not on file  Relationships  . Social Herbalist on phone: Not on file    Gets together: Not on file    Attends religious service: Not on file    Active member of club or organization: Not on file    Attends meetings of clubs or organizations: Not on file    Relationship status: Not on file  . Intimate partner violence    Fear of current or ex partner: Not on file    Emotionally abused: Not on file    Physically abused: Not on file    Forced sexual activity: Not on file  Other Topics Concern  . Not on file  Social History  Narrative  . Not on file     Review of Systems  All other systems reviewed and are negative.      Objective:   Physical Exam  Constitutional: She is oriented to person, place, and time. She appears well-developed and well-nourished. No distress.  HENT:  Head: Normocephalic and atraumatic.  Right Ear: Tympanic membrane and ear canal normal.  Left Ear: Tympanic membrane and ear canal normal.  Nose: Mucosal edema and rhinorrhea present. Right sinus exhibits maxillary sinus tenderness and frontal sinus tenderness. Left sinus exhibits maxillary sinus tenderness and frontal sinus tenderness.  Eyes: Pupils are equal, round, and reactive to light. EOM are normal.  Neck: Neck supple. No JVD present.  Cardiovascular: Normal rate, regular rhythm and normal heart sounds. Exam reveals no gallop.  No murmur heard. Pulmonary/Chest: Effort normal and breath sounds normal. No respiratory distress. She has no wheezes. She has no rales.  Lymphadenopathy:    She has no cervical adenopathy.  Neurological: She is alert and oriented to person, place, and time. She displays normal reflexes. No cranial nerve deficit. She exhibits normal muscle tone. Coordination normal.  Skin: She is not diaphoretic.  Vitals reviewed.   Patient has pain underneath her patella with flexion of the knee.      Assessment & Plan:  Acute pain of left knee  Dizziness  Gastroesophageal reflux disease, esophagitis presence not specified  Acute bacterial rhinosinusitis  I believe the pain in her left knee is likely osteoarthritis coupled with patellofemoral syndrome exacerbated by obesity.  I have recommended gradual weight loss.  She can also use meloxicam 15 mg p.o. daily as needed knee pain but I explained that the use of NSAIDs will exacerbate her GERD.  I believe the dizziness that she is experiencing is likely due to bacterial sinusitis.  I have recommended Augmentin 875 mg p.o. twice daily for 10 days.  Of also  recommended Xyzal 5 mg p.o. daily for allergies which likely contributed to the sinus infection.  I believe her reflux is uncontrolled by Prilosec.  Therefore I want her to discontinue Prilosec and replace with Protonix 40 mg twice daily.  I am increasing to twice daily because I anticipate that the increased use of NSAIDs will likely only compound her acid reflux.  I believe the rash in the crease of her thigh is Candida intertrigo which I will treat with Lotrisone cream twice daily for 7 to 10 days.  I did give the patient a prescription for  Diflucan which she can use if she gets a yeast infection from the antibiotic.

## 2019-08-03 ENCOUNTER — Encounter (HOSPITAL_COMMUNITY): Payer: Self-pay

## 2019-08-03 ENCOUNTER — Ambulatory Visit (HOSPITAL_COMMUNITY)
Admission: EM | Admit: 2019-08-03 | Discharge: 2019-08-03 | Disposition: A | Discharge status: Home / Self Care | Payer: Medicaid Other

## 2019-08-03 ENCOUNTER — Other Ambulatory Visit: Payer: Self-pay

## 2019-08-03 DIAGNOSIS — M79605 Pain in left leg: Secondary | ICD-10-CM | POA: Diagnosis not present

## 2019-08-03 NOTE — ED Triage Notes (Signed)
Pt present left knee/leg pain. Pt states it feel like someone has tied her veins together in her left legs

## 2019-08-03 NOTE — Discharge Instructions (Signed)
Ice, elevate, activity as tolerated.  Please start your meloxicam daily in the morning, take it with food.  May follow up with orthopedics for persistent symptoms as they may have further recommendations for treatment.  Any increased symptoms please return to be seen or go to the ER.

## 2019-08-03 NOTE — ED Provider Notes (Signed)
[facility]    CSN: [id number] Arrival date & time: 08/03/19  1152      History   Chief Complaint Chief Complaint  Patient presents with  . Knee Pain  . Leg Pain    HPI [name] is a 38 y.o. female.   [name] presents with complaints of pain and "numbness sensation" to left leg. Started 2 days ago. Pain is primarily medial to left knee, but feels the sensation to entire lower leg. Worse when she was driving, for example. No redness or swelling. No warmth. States seh feels like she can see/ feel her veins better than typically to the affected area. No recent travel. No oral birth control or hormone therapy. No cough or shortness of breath . Doesn't smoke. Hasn't worsened but hasn't improved. Has had left knee pain, has seen her PCP for this in the past. Was provided with meloxicam, states she has been taking it at night for the past 3 days therefore unable to determine if it has helped at all. She is also currently on antibiotics  For sinusitis. No back pain. Some tingling sensation to lower leg. Ambulatory without difficulty. No known injury. History  Of anxiety, asthma, depression, IBS.     ROS per HPI, negative if not otherwise mentioned.      Past Medical History:  Diagnosis Date  . Anxiety   . Asthma   . Depression   . Ectopic pregnancy   . Irritable bowel syndrome (IBS) .  Marland Kitchen PTSD (post-traumatic stress disorder)     Patient Active Problem List   Diagnosis Date Noted  . GERD (gastroesophageal reflux disease) 06/08/2017  . MDD (major depressive disorder), recurrent episode, severe ([place]) 11/16/2015  . Marijuana abuse     Past Surgical History:  Procedure Laterality Date  . TONSILLECTOMY    . TUBAL LIGATION      OB History   No obstetric history on file.      Home Medications    Prior to Admission medications   Medication Sig Start Date End Date Taking? Authorizing Provider  amoxicillin-clavulanate (AUGMENTIN) 875-125 MG tablet Take 1  tablet by mouth 2 (two) times daily. 07/26/19   Susy Frizzle, MD  clotrimazole-betamethasone (LOTRISONE) cream Apply 1 application topically 2 (two) times daily. 07/26/19   Susy Frizzle, MD  fluticasone (FLONASE) 50 MCG/ACT nasal spray Place 2 sprays into both nostrils daily. 02/27/19   Delsa Grana, PA-C  levocetirizine (XYZAL) 5 MG tablet Take 1 tablet (5 mg total) by mouth every evening. 07/26/19   Susy Frizzle, MD  meloxicam (MOBIC) 15 MG tablet Take 1 tablet (15 mg total) by mouth daily. 07/26/19   Susy Frizzle, MD  omeprazole (PRILOSEC) 20 MG capsule TAKE 1 CAPSULE (20 MG TOTAL) BY MOUTH DAILY AS NEEDED. 03/18/19   Susy Frizzle, MD  pantoprazole (PROTONIX) 40 MG tablet Take 1 tablet (40 mg total) by mouth 2 (two) times daily. 07/26/19   Susy Frizzle, MD    Family History History reviewed. No pertinent family history.  Social History Social History   Tobacco Use  . Smoking status: Former Smoker    Packs/day: 0.50    Types: Cigarettes  . Smokeless tobacco: Never Used  Substance Use Topics  . Alcohol use: Yes    Comment: social  . Drug use: Not Currently    Types: Marijuana     Allergies   Latex   Review of Systems Review of Systems  Physical Exam Triage Vital Signs ED Triage Vitals  Enc Vitals Group     BP 08/03/19 1252 111/83     Pulse Rate 08/03/19 1252 77     Resp 08/03/19 1252 16     Temp 08/03/19 1252 98.4 F (36.9 C)     Temp Source 08/03/19 1252 Oral     SpO2 08/03/19 1252 99 %     Weight --      Height --      Head Circumference --      Peak Flow --      Pain Score 08/03/19 1253 8     Pain Loc --      Pain Edu? --      Excl. in GC? --    No data found.  Updated Vital Signs BP 111/83 (BP Location: Right Arm)   Pulse 77   Temp 98.4 F (36.9 C) (Oral)   Resp 16   SpO2 99%   Visual Acuity Right Eye Distance:   Left Eye Distance:   Bilateral Distance:    Right Eye Near:   Left Eye Near:    Bilateral Near:      Physical Exam Constitutional:      General: She is not in acute distress.    Appearance: She is well-developed.  Cardiovascular:     Rate and Rhythm: Normal rate.  Pulmonary:     Effort: Pulmonary effort is normal.  Musculoskeletal:     Left knee: She exhibits normal range of motion, no swelling, no effusion, no ecchymosis, no deformity, no laceration, no erythema, normal alignment, no LCL laxity and no bony tenderness. No tenderness found.     Left lower leg: She exhibits no tenderness, no bony tenderness, no swelling, no deformity and no laceration. No edema.     Comments: Left knee and leg without redness, warmth, swelling or redness; negative homans sign; no palpable nodules or masses; full rom of knee and leg; strong pedal pulse; gross sensation intact; ambulatory without difficulty   Skin:    General: Skin is warm and dry.  Neurological:     Mental Status: She is alert and oriented to person, place, and time.      UC Treatments / Results  Labs (all labs ordered are listed, but only abnormal results are displayed) Labs Reviewed - No data to display  EKG   Radiology No results found.  Procedures Procedures (including critical care time)  Medications Ordered in UC Medications - No data to display  Initial Impression / Assessment and Plan / UC Course  I have reviewed the triage vital signs and the nursing notes.  Pertinent labs & imaging results that were available during my care of the patient were reviewed by me and considered in my medical decision making (see chart for details).     Exam is grossly normal. Reassurance provided. No indication of DVT. Neuropathic pain? Patient has taken gabapentin in the past but was unable to tolerate. Encouraged to start meloxicam in the morning, take with food. Ice application to knee after increased use. Continue to follow up with PCP and/or orthopedics as needed. Patient verbalized understanding and agreeable to plan.  Ambulatory  out of clinic without difficulty.    Final Clinical Impressions(s) / UC Diagnoses   Final diagnoses:  Left leg pain     Discharge Instructions     Ice, elevate, activity as tolerated.  Please start your meloxicam daily in the morning, take it with food.  May follow  up with orthopedics for persistent symptoms as they may have further recommendations for treatment.  Any increased symptoms please return to be seen or go to the ER.     ED Prescriptions    None     Controlled Substance Prescriptions [place] Controlled Substance Registry consulted? Not Applicable   Georgetta Haber, NP 08/03/19 1417

## 2019-08-23 ENCOUNTER — Encounter: Payer: Medicaid Other | Admitting: Family Medicine

## 2019-08-29 ENCOUNTER — Ambulatory Visit: Payer: Medicaid Other | Admitting: Family Medicine

## 2019-08-29 ENCOUNTER — Other Ambulatory Visit: Payer: Self-pay

## 2019-08-29 ENCOUNTER — Encounter: Payer: Self-pay | Admitting: Family Medicine

## 2019-08-29 VITALS — BP 110/60 | HR 88 | Temp 98.2°F | Resp 18 | Ht 68.0 in | Wt 314.0 lb

## 2019-08-29 DIAGNOSIS — M25562 Pain in left knee: Secondary | ICD-10-CM | POA: Diagnosis not present

## 2019-08-29 NOTE — Progress Notes (Signed)
Subjective:    Patient ID: [name], female    DOB: [birth year], 38 y.o.   MRN: [id number]  HPI  07/26/19 Patient presents with a multitude of complaints today.  First she reports pressure-like pain in her frontal and maxillary sinuses.  This is gone on now for several months.  It seems to be triggered by allergies.  Whenever she becomes congested they hurt and ache.  Over the last few weeks they have contributed to dizziness.  If she stands up quickly she will feel very lightheaded.  She also feels off balance at times.  She reports constant pain and pressure in her maxillary and frontal sinuses.  She reports tenderness to percussion over her maxillary and frontal sinuses today.  She has been taking Flonase which is helped some with the symptoms but has not alleviated them.  She also complains of a rash in the crease between her thighs and her perineum.  There is an erythematous scaly rash in that area.  It gets worse when she sweats and when it is hot.  She is tried nystatin cream with no relief.  The rash gets better if she keeps the area dry.  She also complains of pain in her knees primarily her left knee.  The pain is located under the patella.  Hurts when she rises from a squat.  Hurts when she goes up steps.  It hurts when she walks for prolonged period of time.  Meloxicam in the past has helped the symptoms however she ran out.  She denies any erythema or effusion.  Previous x-ray has been unremarkable.  Previously thought that she had patellofemoral syndrome and early osteoarthritis contributed to by obesity.  She also complains of worsening acid reflux.  She is taking Protonix 20 mg a day with very little relief.  She reports indigestion refractory to this that occurs on a daily basis.  At that time, my plan was: I believe the pain in her left knee is likely osteoarthritis coupled with patellofemoral syndrome exacerbated by obesity.  I have recommended gradual weight loss.  She can also use  meloxicam 15 mg p.o. daily as needed knee pain but I explained that the use of NSAIDs will exacerbate her GERD.  I believe the dizziness that she is experiencing is likely due to bacterial sinusitis.  I have recommended Augmentin 875 mg p.o. twice daily for 10 days.  Of also recommended Xyzal 5 mg p.o. daily for allergies which likely contributed to the sinus infection.  I believe her reflux is uncontrolled by Prilosec.  Therefore I want her to discontinue Prilosec and replace with Protonix 40 mg twice daily.  I am increasing to twice daily because I anticipate that the increased use of NSAIDs will likely only compound her acid reflux.  I believe the rash in the crease of her thigh is Candida intertrigo which I will treat with Lotrisone cream twice daily for 7 to 10 days.  I did give the patient a prescription for Diflucan which she can use if she gets a yeast infection from the antibiotic.  08/29/19 Patient states the meloxicam did very little for her knee pain.  She continues to complain of pain around and under the kneecap.  It is worse with standing.  Is worse with walking.  She denies any laxity to varus or valgus stress.  She denies any locking or catching.  She denies that the knee feels like it is going to give out on her.  Today on examination she has a negative Apley grind.  There is no erythema or effusion.  She has not had a basic x-ray.  She is also interested in weight loss options as she realizes her weight is contributing to her knee pain. Past Medical History:  Diagnosis Date  . Anxiety   . Asthma   . Depression   . Ectopic pregnancy   . Irritable bowel syndrome (IBS) .  Marland Kitchen. PTSD (post-traumatic stress disorder)    Past Surgical History:  Procedure Laterality Date  . TONSILLECTOMY    . TUBAL LIGATION     Current Outpatient Medications on File Prior to Visit  Medication Sig Dispense Refill  . fluticasone (FLONASE) 50 MCG/ACT nasal spray Place 2 sprays into both nostrils daily. 16 g 6   . levocetirizine (XYZAL) 5 MG tablet Take 1 tablet (5 mg total) by mouth every evening. 30 tablet 0  . meloxicam (MOBIC) 15 MG tablet Take 1 tablet (15 mg total) by mouth daily. 30 tablet 2  . pantoprazole (PROTONIX) 40 MG tablet Take 1 tablet (40 mg total) by mouth 2 (two) times daily. 60 tablet 3   No current facility-administered medications on file prior to visit.    Allergies  Allergen Reactions  . Latex Rash    Reaction to gloves   Social History   Socioeconomic History  . Marital status: Single    Spouse name: Not on file  . Number of children: Not on file  . Years of education: Not on file  . Highest education level: Not on file  Occupational History  . Not on file  Social Needs  . Financial resource strain: Not on file  . Food insecurity    Worry: Not on file    Inability: Not on file  . Transportation needs    Medical: Not on file    Non-medical: Not on file  Tobacco Use  . Smoking status: Former Smoker    Packs/day: 0.50    Types: Cigarettes  . Smokeless tobacco: Never Used  Substance and Sexual Activity  . Alcohol use: Yes    Comment: social  . Drug use: Not Currently    Types: Marijuana  . Sexual activity: Yes    Birth control/protection: None  Lifestyle  . Physical activity    Days per week: Not on file    Minutes per session: Not on file  . Stress: Not on file  Relationships  . Social Musicianconnections    Talks on phone: Not on file    Gets together: Not on file    Attends religious service: Not on file    Active member of club or organization: Not on file    Attends meetings of clubs or organizations: Not on file    Relationship status: Not on file  . Intimate partner violence    Fear of current or ex partner: Not on file    Emotionally abused: Not on file    Physically abused: Not on file    Forced sexual activity: Not on file  Other Topics Concern  . Not on file  Social History Narrative  . Not on file     Review of Systems  All other  systems reviewed and are negative.      Objective:   Physical Exam  Constitutional: She appears well-developed and well-nourished. No distress.  HENT:  Right Ear: Tympanic membrane and ear canal normal.  Left Ear: Tympanic membrane and ear canal normal.  Eyes: EOM are normal.  Cardiovascular: Normal rate, regular rhythm and normal heart sounds. Exam reveals no gallop.  No murmur heard. Pulmonary/Chest: Effort normal and breath sounds normal. No respiratory distress. She has no wheezes. She has no rales.  Musculoskeletal:     Left knee: She exhibits decreased range of motion. She exhibits no swelling, no effusion, no LCL laxity, normal meniscus and no MCL laxity. Tenderness found. Medial joint line and lateral joint line tenderness noted.  Skin: She is not diaphoretic.  Vitals reviewed.   Patient has pain underneath her patella with flexion of the knee.      Assessment & Plan:  Acute pain of left knee - Plan: DG Knee Complete 4 Views Left I suspect osteoarthritis of the knee.  I recommended an x-ray of her left knee and if osteoarthritis is confirmed I would recommend a cortisone injection of the knee as well as possible physical therapy.  We also discussed medication options for weight loss as therapeutic lifestyle changes are proving ineffective for her.  I have recommended either phentermine versus Saxenda.  I have recommended the patient check with her insurance to see what if any options are covered and then we can discuss that would be the better option for her

## 2019-08-30 ENCOUNTER — Ambulatory Visit
Admission: RE | Admit: 2019-08-30 | Discharge: 2019-08-30 | Disposition: A | Discharge status: Home / Self Care | Payer: Medicaid Other | Source: Ambulatory Visit | Attending: Family Medicine | Admitting: Family Medicine

## 2019-08-30 DIAGNOSIS — M25562 Pain in left knee: Secondary | ICD-10-CM

## 2019-09-06 ENCOUNTER — Encounter: Payer: Self-pay | Admitting: Family Medicine

## 2019-09-06 ENCOUNTER — Ambulatory Visit (INDEPENDENT_AMBULATORY_CARE_PROVIDER_SITE_OTHER): Payer: Medicaid Other | Admitting: Family Medicine

## 2019-09-06 VITALS — BP 128/74 | HR 90 | Temp 96.6°F | Resp 18 | Ht 68.0 in | Wt 314.0 lb

## 2019-09-06 DIAGNOSIS — M25562 Pain in left knee: Secondary | ICD-10-CM

## 2019-09-06 NOTE — Progress Notes (Signed)
Subjective:    Patient ID: [name], female    DOB: [birth year], 38 y.o.   MRN: [id number]  HPI  07/26/19 Patient presents with a multitude of complaints today.  First she reports pressure-like pain in her frontal and maxillary sinuses.  This is gone on now for several months.  It seems to be triggered by allergies.  Whenever she becomes congested they hurt and ache.  Over the last few weeks they have contributed to dizziness.  If she stands up quickly she will feel very lightheaded.  She also feels off balance at times.  She reports constant pain and pressure in her maxillary and frontal sinuses.  She reports tenderness to percussion over her maxillary and frontal sinuses today.  She has been taking Flonase which is helped some with the symptoms but has not alleviated them.  She also complains of a rash in the crease between her thighs and her perineum.  There is an erythematous scaly rash in that area.  It gets worse when she sweats and when it is hot.  She is tried nystatin cream with no relief.  The rash gets better if she keeps the area dry.  She also complains of pain in her knees primarily her left knee.  The pain is located under the patella.  Hurts when she rises from a squat.  Hurts when she goes up steps.  It hurts when she walks for prolonged period of time.  Meloxicam in the past has helped the symptoms however she ran out.  She denies any erythema or effusion.  Previous x-ray has been unremarkable.  Previously thought that she had patellofemoral syndrome and early osteoarthritis contributed to by obesity.  She also complains of worsening acid reflux.  She is taking Protonix 20 mg a day with very little relief.  She reports indigestion refractory to this that occurs on a daily basis.  At that time, my plan was: I believe the pain in her left knee is likely osteoarthritis coupled with patellofemoral syndrome exacerbated by obesity.  I have recommended gradual weight loss.  She can also use  meloxicam 15 mg p.o. daily as needed knee pain but I explained that the use of NSAIDs will exacerbate her GERD.  I believe the dizziness that she is experiencing is likely due to bacterial sinusitis.  I have recommended Augmentin 875 mg p.o. twice daily for 10 days.  Of also recommended Xyzal 5 mg p.o. daily for allergies which likely contributed to the sinus infection.  I believe her reflux is uncontrolled by Prilosec.  Therefore I want her to discontinue Prilosec and replace with Protonix 40 mg twice daily.  I am increasing to twice daily because I anticipate that the increased use of NSAIDs will likely only compound her acid reflux.  I believe the rash in the crease of her thigh is Candida intertrigo which I will treat with Lotrisone cream twice daily for 7 to 10 days.  I did give the patient a prescription for Diflucan which she can use if she gets a yeast infection from the antibiotic.  08/29/19 Patient states the meloxicam did very little for her knee pain.  She continues to complain of pain around and under the kneecap.  It is worse with standing.  Is worse with walking.  She denies any laxity to varus or valgus stress.  She denies any locking or catching.  She denies that the knee feels like it is going to give out on her.  Today on examination she has a negative Apley grind.  There is no erythema or effusion.  She has not had a basic x-ray.  She is also interested in weight loss options as she realizes her weight is contributing to her knee pain.  At that time, my plan was: .I suspect osteoarthritis of the knee.  I recommended an x-ray of her left knee and if osteoarthritis is confirmed I would recommend a cortisone injection of the knee as well as possible physical therapy.  We also discussed medication options for weight loss as therapeutic lifestyle changes are proving ineffective for her.  I have recommended either phentermine versus Saxenda.  I have recommended the patient check with her insurance  to see what if any options are covered and then we can discuss that would be the better option for her  09/06/19 X-rays revealed no abnormality suggesting possibly a meniscal tear.  Patient is here today to try cortisone injection.  Patient continues to report pain around the patella and also over the medial and lateral joint line.  She complains of pain with ambulation and standing Past Medical History:  Diagnosis Date  . Anxiety   . Asthma   . Depression   . Ectopic pregnancy   . Irritable bowel syndrome (IBS) .  Marland Kitchen PTSD (post-traumatic stress disorder)    Past Surgical History:  Procedure Laterality Date  . TONSILLECTOMY    . TUBAL LIGATION     Current Outpatient Medications on File Prior to Visit  Medication Sig Dispense Refill  . fluticasone (FLONASE) 50 MCG/ACT nasal spray Place 2 sprays into both nostrils daily. 16 g 6  . levocetirizine (XYZAL) 5 MG tablet Take 1 tablet (5 mg total) by mouth every evening. 30 tablet 0  . meloxicam (MOBIC) 15 MG tablet Take 1 tablet (15 mg total) by mouth daily. 30 tablet 2  . pantoprazole (PROTONIX) 40 MG tablet Take 1 tablet (40 mg total) by mouth 2 (two) times daily. 60 tablet 3   No current facility-administered medications on file prior to visit.    Allergies  Allergen Reactions  . Latex Rash    Reaction to gloves   Social History   Socioeconomic History  . Marital status: Single    Spouse name: Not on file  . Number of children: Not on file  . Years of education: Not on file  . Highest education level: Not on file  Occupational History  . Not on file  Social Needs  . Financial resource strain: Not on file  . Food insecurity    Worry: Not on file    Inability: Not on file  . Transportation needs    Medical: Not on file    Non-medical: Not on file  Tobacco Use  . Smoking status: Former Smoker    Packs/day: 0.50    Types: Cigarettes  . Smokeless tobacco: Never Used  Substance and Sexual Activity  . Alcohol use: Yes     Comment: social  . Drug use: Not Currently    Types: Marijuana  . Sexual activity: Yes    Birth control/protection: None  Lifestyle  . Physical activity    Days per week: Not on file    Minutes per session: Not on file  . Stress: Not on file  Relationships  . Social Herbalist on phone: Not on file    Gets together: Not on file    Attends religious service: Not on file    Active member  of club or organization: Not on file    Attends meetings of clubs or organizations: Not on file    Relationship status: Not on file  . Intimate partner violence    Fear of current or ex partner: Not on file    Emotionally abused: Not on file    Physically abused: Not on file    Forced sexual activity: Not on file  Other Topics Concern  . Not on file  Social History Narrative  . Not on file     Review of Systems  All other systems reviewed and are negative.      Objective:   Physical Exam  Constitutional: She appears well-developed and well-nourished. No distress.  Cardiovascular: Normal rate, regular rhythm and normal heart sounds. Exam reveals no gallop.  No murmur heard. Pulmonary/Chest: Effort normal and breath sounds normal. No respiratory distress. She has no wheezes. She has no rales.  Musculoskeletal:     Left knee: She exhibits decreased range of motion. She exhibits no swelling, no effusion, no LCL laxity, normal meniscus and no MCL laxity. Tenderness found. Medial joint line and lateral joint line tenderness noted.  Skin: She is not diaphoretic.  Vitals reviewed.   Patient has pain underneath her patella with flexion of the knee.      Assessment & Plan:   Acute pain of left knee  I suspect a meniscal tear.  Using sterile technique, I injected the left knee with 2 cc lidocaine, 2 cc of Marcaine, and 2 cc of 40 mg/mL Kenalog.  The patient tolerated the procedure well without complication.  Reassess in 1 week.  If no better or worsening I would recommend  orthopedics consultation versus MRI.

## 2019-10-02 ENCOUNTER — Other Ambulatory Visit: Payer: Self-pay

## 2019-10-03 ENCOUNTER — Encounter: Payer: Self-pay | Admitting: Family Medicine

## 2019-10-03 ENCOUNTER — Ambulatory Visit (INDEPENDENT_AMBULATORY_CARE_PROVIDER_SITE_OTHER): Payer: Medicaid Other | Admitting: Family Medicine

## 2019-10-03 VITALS — BP 126/74 | HR 60 | Temp 98.2°F | Resp 16 | Ht 68.0 in | Wt 310.0 lb

## 2019-10-03 DIAGNOSIS — Z124 Encounter for screening for malignant neoplasm of cervix: Secondary | ICD-10-CM | POA: Diagnosis not present

## 2019-10-03 DIAGNOSIS — Z0000 Encounter for general adult medical examination without abnormal findings: Secondary | ICD-10-CM | POA: Diagnosis not present

## 2019-10-03 DIAGNOSIS — Z1231 Encounter for screening mammogram for malignant neoplasm of breast: Secondary | ICD-10-CM

## 2019-10-03 NOTE — Progress Notes (Signed)
Subjective:    Patient ID: [name], female    DOB: 16-Jan-1981, 38 y.o.   MRN: [id number]  HPI Patient is a 38 year old Caucasian female here today for complete physical exam.  She denies any specific concerns today.  She is due for a flu shot.  The patient politely declines a flu shot.  She is due for a Pap smear.  She prefers to see a gynecologist for this rather than have me perform.  She is also due for mammogram which she would like me to schedule.  On her exam today, the patient does have a goiter although I do not appreciate any nodularity.  Other than that her physical exam is significant only for an elevated BMI.  Patient denies any family history of premature colon cancer Past Medical History:  Diagnosis Date  . Anxiety   . Asthma   . Depression   . Ectopic pregnancy   . Irritable bowel syndrome (IBS) .  Marland Kitchen PTSD (post-traumatic stress disorder)    Past Surgical History:  Procedure Laterality Date  . TONSILLECTOMY    . TUBAL LIGATION     Current Outpatient Medications on File Prior to Visit  Medication Sig Dispense Refill  . pantoprazole (PROTONIX) 40 MG tablet Take 1 tablet (40 mg total) by mouth 2 (two) times daily. 60 tablet 3   No current facility-administered medications on file prior to visit.    Allergies  Allergen Reactions  . Latex Rash    Reaction to gloves   Social History   Socioeconomic History  . Marital status: Single    Spouse name: Not on file  . Number of children: Not on file  . Years of education: Not on file  . Highest education level: Not on file  Occupational History  . Not on file  Social Needs  . Financial resource strain: Not on file  . Food insecurity    Worry: Not on file    Inability: Not on file  . Transportation needs    Medical: Not on file    Non-medical: Not on file  Tobacco Use  . Smoking status: Former Smoker    Packs/day: 0.50    Types: Cigarettes  . Smokeless tobacco: Never Used  Substance and Sexual Activity  .  Alcohol use: Yes    Comment: social  . Drug use: Not Currently    Types: Marijuana  . Sexual activity: Yes    Birth control/protection: None  Lifestyle  . Physical activity    Days per week: Not on file    Minutes per session: Not on file  . Stress: Not on file  Relationships  . Social Herbalist on phone: Not on file    Gets together: Not on file    Attends religious service: Not on file    Active member of club or organization: Not on file    Attends meetings of clubs or organizations: Not on file    Relationship status: Not on file  . Intimate partner violence    Fear of current or ex partner: Not on file    Emotionally abused: Not on file    Physically abused: Not on file    Forced sexual activity: Not on file  Other Topics Concern  . Not on file  Social History Narrative  . Not on file   No family history on file.    Review of Systems  All other systems reviewed and are negative.  Objective:   Physical Exam Vitals signs reviewed.  Constitutional:      General: She is not in acute distress.    Appearance: She is obese. She is not ill-appearing, toxic-appearing or diaphoretic.  HENT:     Head: Normocephalic and atraumatic.     Right Ear: Tympanic membrane, ear canal and external ear normal. There is no impacted cerumen.     Left Ear: Tympanic membrane, ear canal and external ear normal. There is no impacted cerumen.     Nose: Nose normal. No congestion or rhinorrhea.     Mouth/Throat:     Mouth: Mucous membranes are moist.     Pharynx: Oropharynx is clear. No oropharyngeal exudate or posterior oropharyngeal erythema.  Eyes:     General: No scleral icterus.       Right eye: No discharge.        Left eye: No discharge.     Extraocular Movements: Extraocular movements intact.     Conjunctiva/sclera: Conjunctivae normal.     Pupils: Pupils are equal, round, and reactive to light.  Neck:     Musculoskeletal: Normal range of motion and neck  supple. No muscular tenderness.     Vascular: No carotid bruit.  Cardiovascular:     Rate and Rhythm: Normal rate and regular rhythm.     Pulses: Normal pulses.     Heart sounds: Normal heart sounds. No murmur. No friction rub. No gallop.   Pulmonary:     Effort: Pulmonary effort is normal. No respiratory distress.     Breath sounds: Normal breath sounds. No stridor. No wheezing, rhonchi or rales.  Chest:     Chest wall: No tenderness.  Abdominal:     General: Abdomen is flat. Bowel sounds are normal. There is no distension.     Palpations: Abdomen is soft. There is no mass.     Tenderness: There is no abdominal tenderness. There is no guarding or rebound.     Hernia: No hernia is present.  Musculoskeletal: Normal range of motion.     Right lower leg: No edema.     Left lower leg: No edema.  Lymphadenopathy:     Cervical: No cervical adenopathy.  Skin:    General: Skin is warm.     Capillary Refill: Capillary refill takes less than 2 seconds.     Coloration: Skin is not jaundiced or pale.     Findings: No bruising, erythema, lesion or rash.  Neurological:     General: No focal deficit present.     Mental Status: She is alert and oriented to person, place, and time. Mental status is at baseline.     Cranial Nerves: No cranial nerve deficit.     Sensory: No sensory deficit.     Motor: No weakness.     Coordination: Coordination normal.     Gait: Gait normal.     Deep Tendon Reflexes: Reflexes normal.  Psychiatric:        Mood and Affect: Mood normal.        Behavior: Behavior normal.        Thought Content: Thought content normal.        Judgment: Judgment normal.           Assessment & Plan:  Encounter for screening mammogram for malignant neoplasm of breast - Plan: MM Digital Screening  Cervical cancer screening - Plan: Ambulatory referral to Obstetrics / Gynecology  General medical exam - Plan: CBC with Differential, COMPLETE METABOLIC PANEL WITH  GFR, Lipid Panel,  TSH  I will schedule the patient for her mammogram.  I will also consult GYN for a Pap smear per the patient's request.  Regarding her physical exam I will check a CBC, CMP, fasting lipid panel and due to the goiter I will also check a TSH.  I recommended a flu shot for the patient but she politely declined.  Recommended therapeutic lifestyle changes to address elevated BMI.

## 2019-10-04 LAB — COMPLETE METABOLIC PANEL WITH GFR
AG Ratio: 1.5 (calc) (ref 1.0–2.5)
ALT: 54 U/L — ABNORMAL HIGH (ref 6–29)
AST: 20 U/L (ref 10–30)
Albumin: 4 g/dL (ref 3.6–5.1)
Alkaline phosphatase (APISO): 88 U/L (ref 31–125)
BUN: 11 mg/dL (ref 7–25)
CO2: 26 mmol/L (ref 20–32)
Calcium: 9 mg/dL (ref 8.6–10.2)
Chloride: 105 mmol/L (ref 98–110)
Creat: 0.76 mg/dL (ref 0.50–1.10)
GFR, Est African American: 114 mL/min/{1.73_m2} (ref >=60)
GFR, Est Non African American: 98 mL/min/{1.73_m2} (ref >=60)
Globulin: 2.6 g/dL (calc) (ref 1.9–3.7)
Glucose, Bld: 100 mg/dL — ABNORMAL HIGH (ref 65–99)
Potassium: 4.1 mmol/L (ref 3.5–5.3)
Sodium: 139 mmol/L (ref 135–146)
Total Bilirubin: 0.5 mg/dL (ref 0.2–1.2)
Total Protein: 6.6 g/dL (ref 6.1–8.1)

## 2019-10-04 LAB — LIPID PANEL
Cholesterol: 210 mg/dL — ABNORMAL HIGH (ref <200)
HDL: 45 mg/dL — ABNORMAL LOW (ref >=50)
LDL Cholesterol (Calc): 142 mg/dL (calc) — ABNORMAL HIGH
Non-HDL Cholesterol (Calc): 165 mg/dL (calc) — ABNORMAL HIGH (ref <130)
Total CHOL/HDL Ratio: 4.7 (calc) (ref <5.0)
Triglycerides: 114 mg/dL (ref <150)

## 2019-10-04 LAB — CBC WITH DIFFERENTIAL/PLATELET
Absolute Monocytes: 561 cells/uL (ref 200–950)
Basophils Absolute: 20 cells/uL (ref 0–200)
Basophils Relative: 0.2 %
Eosinophils Absolute: 143 cells/uL (ref 15–500)
Eosinophils Relative: 1.4 %
HCT: 41.4 % (ref 35.0–45.0)
Hemoglobin: 13.7 g/dL (ref 11.7–15.5)
Lymphs Abs: 1958 cells/uL (ref 850–3900)
MCH: 27.9 pg (ref 27.0–33.0)
MCHC: 33.1 g/dL (ref 32.0–36.0)
MCV: 84.3 fL (ref 80.0–100.0)
MPV: 10.6 fL (ref 7.5–12.5)
Monocytes Relative: 5.5 %
Neutro Abs: 7517 cells/uL (ref 1500–7800)
Neutrophils Relative %: 73.7 %
Platelets: 210 10*3/uL (ref 140–400)
RBC: 4.91 10*6/uL (ref 3.80–5.10)
RDW: 12.5 % (ref 11.0–15.0)
Total Lymphocyte: 19.2 %
WBC: 10.2 10*3/uL (ref 3.8–10.8)

## 2019-10-04 LAB — TSH: TSH: 1.98 mIU/L

## 2019-10-08 ENCOUNTER — Other Ambulatory Visit: Payer: Self-pay | Admitting: Family Medicine

## 2019-10-08 MED ORDER — ATORVASTATIN CALCIUM 20 MG PO TABS: 20.0000 mg | ORAL_TABLET | Freq: Every day | ORAL | 1 refills | Status: DC

## 2019-10-11 ENCOUNTER — Telehealth: Payer: Self-pay | Admitting: Family Medicine

## 2019-10-11 NOTE — Telephone Encounter (Signed)
Pt called back and states that while in her ov you had mentioned something about her thyroid being enlarged or soemthing like that. She wanted to know if there was anything, scans or more labs,  she needed to do for it?

## 2019-10-11 NOTE — Telephone Encounter (Signed)
If it gets any larger I would recommend a thyroid ultrasound however her recent TSH was normal.

## 2019-10-11 NOTE — Telephone Encounter (Signed)
Patient aware of providers recommendations.  

## 2019-10-15 ENCOUNTER — Encounter: Payer: Self-pay | Admitting: Family Medicine

## 2019-10-15 ENCOUNTER — Other Ambulatory Visit: Payer: Self-pay

## 2019-10-15 ENCOUNTER — Ambulatory Visit (INDEPENDENT_AMBULATORY_CARE_PROVIDER_SITE_OTHER): Payer: Medicaid Other | Admitting: Family Medicine

## 2019-10-15 VITALS — BP 128/72 | HR 86 | Temp 98.4°F | Resp 12 | Ht 68.0 in | Wt 312.0 lb

## 2019-10-15 DIAGNOSIS — N760 Acute vaginitis: Secondary | ICD-10-CM

## 2019-10-15 DIAGNOSIS — Z113 Encounter for screening for infections with a predominantly sexual mode of transmission: Secondary | ICD-10-CM | POA: Diagnosis not present

## 2019-10-15 LAB — WET PREP FOR TRICH, YEAST, CLUE

## 2019-10-15 MED ORDER — CLOTRIMAZOLE 1 % EX CREA: 1.0000 "application " | TOPICAL_CREAM | Freq: Two times a day (BID) | CUTANEOUS | 2 refills | Status: DC

## 2019-10-15 NOTE — Progress Notes (Signed)
   Subjective:    Patient ID: [name], female    DOB: October 23, 1981, 38 y.o.   MRN: [id number]  Patient presents for Vaginitis (x3 days- irritation to vagina and labia, no dischage noted- has tried leftover clindamycin with no relief)  Pt here with vaginal irritation for the past few days.  Little discharge but she is also noted an odor.  She has had bacterial vaginosis in the past.  SHe took left over clindamycin for 2 days but then it made her sick she recalls that she actually had nausea with this when she was prescribed by her dentist. She is sexually active with one partner but she is concerned that her partner they have cheated on her about a month ago.  She would like to have complete STD screening done.  LMP October 20.  She has had some mild abdominal discomfort with cramping but denies any dysuria no change in her bowels no nausea vomiting.  She does have known intertrigo was using clotrimazole but is out of this cream Review Of Systems:  GEN- denies fatigue, fever, weight loss,weakness, recent illness HEENT- denies eye drainage, change in vision, nasal discharge, CVS- denies chest pain, palpitations RESP- denies SOB, cough, wheeze ABD- denies N/V, change in stools, abd pain GU- denies dysuria, hematuria, dribbling, incontinence MSK- denies joint pain, muscle aches, injury Neuro- denies headache, dizziness, syncope, seizure activity       Objective:    BP 128/72   Pulse 86   Temp 98.4 F (36.9 C) (Temporal)   Resp 12   Ht 5\' 8"  (1.727 m)   Wt (!) 312 lb (141.5 kg)   LMP 09/17/2019   SpO2 98%   BMI 47.44 kg/m  GEN- NAD, alert and oriented x3 CVS- RRR, no murmur RESP-CTAB ABD-NABS,soft,NT,ND GU- normal external genitalia, erythema in bilat groins, labia majora,  vaginal mucosa pink and moist, cervix visualized no growth, no blood form os, +discharge, no CMT, no ovarian masses, uterus normal size EXT- No edema Pulses- Radial 2+        Assessment & Plan:       Problem List Items Addressed This Visit    None    Visit Diagnoses    Vaginitis and vulvovaginitis    -  Primary   Wet prep, no BV, Trich or Yeast, await GC, use 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 trachomatis/N. gonorrhoeae RNA   Screen for STD (sexually transmitted disease)       Relevant Orders   HIV Antibody   RPR   HSV(herpes simplex vrs) 1+2 ab-IgG      Note: This dictation was prepared with Dragon dictation along with smaller phrase technology. Any transcriptional errors that result from this process are unintentional.

## 2019-10-15 NOTE — Patient Instructions (Signed)
We will call with results F/U as needed  

## 2019-10-16 LAB — C. TRACHOMATIS/N. GONORRHOEAE RNA
C. trachomatis RNA, TMA: NOT DETECTED
N. gonorrhoeae RNA, TMA: NOT DETECTED

## 2019-10-16 LAB — HIV ANTIBODY (ROUTINE TESTING W REFLEX): HIV 1&2 Ab, 4th Generation: NONREACTIVE

## 2019-10-16 LAB — RPR: RPR Ser Ql: NONREACTIVE

## 2019-10-16 LAB — HSV(HERPES SIMPLEX VRS) I + II AB-IGG
HAV 1 IGG,TYPE SPECIFIC AB: <0.9 index
HSV 2 IGG,TYPE SPECIFIC AB: <0.9 index

## 2019-11-05 ENCOUNTER — Other Ambulatory Visit: Payer: Self-pay | Admitting: Family Medicine

## 2019-11-11 ENCOUNTER — Other Ambulatory Visit: Payer: Medicaid Other | Admitting: Advanced Practice Midwife

## 2019-12-03 ENCOUNTER — Ambulatory Visit: Payer: Medicaid Other

## 2019-12-20 DIAGNOSIS — F411 Generalized anxiety disorder: Secondary | ICD-10-CM | POA: Diagnosis not present

## 2019-12-20 DIAGNOSIS — F3132 Bipolar disorder, current episode depressed, moderate: Secondary | ICD-10-CM | POA: Diagnosis not present

## 2019-12-23 ENCOUNTER — Encounter: Payer: Self-pay | Admitting: Family Medicine

## 2019-12-23 ENCOUNTER — Other Ambulatory Visit: Payer: Self-pay

## 2019-12-23 ENCOUNTER — Ambulatory Visit (INDEPENDENT_AMBULATORY_CARE_PROVIDER_SITE_OTHER): Payer: Medicaid Other | Admitting: Family Medicine

## 2019-12-23 DIAGNOSIS — F332 Major depressive disorder, recurrent severe without psychotic features: Secondary | ICD-10-CM | POA: Diagnosis not present

## 2019-12-23 DIAGNOSIS — K219 Gastro-esophageal reflux disease without esophagitis: Secondary | ICD-10-CM | POA: Diagnosis not present

## 2019-12-23 DIAGNOSIS — J029 Acute pharyngitis, unspecified: Secondary | ICD-10-CM

## 2019-12-23 MED ORDER — SERTRALINE HCL 50 MG PO TABS: 50.0000 mg | ORAL_TABLET | Freq: Every day | ORAL | 3 refills | Status: DC

## 2019-12-23 MED ORDER — AMOXICILLIN 500 MG PO CAPS: 500.0000 mg | ORAL_CAPSULE | Freq: Two times a day (BID) | ORAL | 0 refills | Status: DC

## 2019-12-23 MED ORDER — FAMOTIDINE 20 MG PO TABS: 20.0000 mg | ORAL_TABLET | Freq: Two times a day (BID) | ORAL | 2 refills | Status: DC

## 2019-12-23 MED ORDER — FLUCONAZOLE 150 MG PO TABS: ORAL_TABLET | ORAL | 0 refills | Status: DC

## 2019-12-23 NOTE — Progress Notes (Signed)
Virtual Visit via Telephone Note  I connected with [name] on 12/23/19 at 11:08am by telephone and verified that I am speaking with the correct person using two identifiers.      Pt location: at home   Physician location:  In office, [facility], Milinda Antis MD     On call: patient and physician   I discussed the limitations, risks, security and privacy concerns of performing an evaluation and management service by telephone and the availability of in person appointments. I also discussed with the patient that there may be a patient responsible charge related to this service. The patient expressed understanding and agreed to proceed.   History of Present Illness:  Pt called in.  With concern for sore throat. States that she was in a long-term relationship they recently broke it off.  She found a new partner and had oral sex with that partner.  Since then she has had sore throat states it started last Thursday.  She is worried it was an STD but now has some ear pressure as well.   she had redness in back of throat , no white spots in tonsils no ulcers in the mouth.  She is able to eat and drink normally.  She denies any chills loss of taste or smell sinus pressure drainage she does have runny nose.  She has not had any cough.  She then states that it was her birthday and she was at a bar over the weekend and no one was wearing a mask at that time.  GERD she has history of GERD.  She has been reluctant to take her pantoprazole due to fear of osteoporosis.  States her mother has had complications from being on the medication for over 10 years and she wanted alternative.  She admits that she has been under a lot of stress recently.  She is currently followed by neuropsychiatric care-was recently restarted on her Zoloft at 50 mg.  States that she was self-medicating with marijuana.   Medications reviewed Observations/Objective: NAD noted   Assessment and Plan: GERD-  Change  to pepcid 20mg  BID we will hold off on the pantoprazole at this time.  Also discussed avoiding foods that trigger her symptoms.  Pharyngitis/ear pain-sounds like a viral illness she has had exposures in the public without mask wearing.  We will have her get COVID-19 testing.  Since she has been sexually active that takes a different spin on things.  She does not see any sores in her mouth but unfortunately I cannot evaluate her in person.  I'm going start her on amoxicillin to cover for pharyngitis bacterial infection of also given her Diflucan as she gets yeast infections.  She will come into the office in a couple of weeks for complete STD screening  MDD- now back on zoloft prescribed by psychiatry, trying to avoid other substances to self medicate  Follow Up Instructions:    I discussed the assessment and treatment plan with the patient. The patient was provided an opportunity to ask questions and all were answered. The patient agreed with the plan and demonstrated an understanding of the instructions.   The patient was advised to call back or seek an in-person evaluation if the symptoms worsen or if the condition fails to improve as anticipated.  I provided  14  minutes of non-face-to-face time during this encounter. End Time 11:22am   , MD

## 2019-12-24 ENCOUNTER — Ambulatory Visit: Payer: Medicaid Other | Attending: Internal Medicine

## 2019-12-24 DIAGNOSIS — Z20822 Contact with and (suspected) exposure to covid-19: Secondary | ICD-10-CM | POA: Diagnosis not present

## 2019-12-25 LAB — NOVEL CORONAVIRUS, NAA: SARS-CoV-2, NAA: NOT DETECTED

## 2019-12-26 ENCOUNTER — Other Ambulatory Visit: Payer: Self-pay

## 2019-12-26 ENCOUNTER — Encounter: Payer: Self-pay | Admitting: Nurse Practitioner

## 2019-12-26 ENCOUNTER — Ambulatory Visit (INDEPENDENT_AMBULATORY_CARE_PROVIDER_SITE_OTHER): Payer: Medicaid Other | Admitting: Nurse Practitioner

## 2019-12-26 VITALS — BP 128/82 | HR 88 | Temp 98.1°F | Resp 16 | Ht 68.0 in | Wt 306.0 lb

## 2019-12-26 DIAGNOSIS — J309 Allergic rhinitis, unspecified: Secondary | ICD-10-CM

## 2019-12-26 DIAGNOSIS — J029 Acute pharyngitis, unspecified: Secondary | ICD-10-CM | POA: Diagnosis not present

## 2019-12-26 DIAGNOSIS — Z113 Encounter for screening for infections with a predominantly sexual mode of transmission: Secondary | ICD-10-CM | POA: Diagnosis not present

## 2019-12-26 LAB — WET PREP FOR TRICH, YEAST, CLUE

## 2019-12-26 NOTE — Patient Instructions (Addendum)
Follow up or seek medical attention for worsening or non resolving symptoms.   STD screening and diagnostics completed today and we will notify you with results.   Continue medications as prior to this visit.   You where Strep Tested today for continued sore throat with negative Covid results completed post visit earlier this week. Strep result was negative.   You are negative for Trichomoniasis, Bacterial vaginosis. I would advise you to take the Flagyl as prescribed.

## 2019-12-26 NOTE — Progress Notes (Signed)
Acute Office Visit  Subjective:    Patient ID: [name], female    DOB: 1981/07/28, 39 y.o.   MRN: [id number]  Chief Complaint:  I had a new sexual partner and I need to be STD tested. My throat is still sore from my two day ago telephone appointment.    HPI  Patient is a 39 y.o Caucasian female presenting today for STD screening. She did complete a telephone visit 2 days ago and was treated with supportive antibiotic and flagy which she filled and is taking.  She admits to clear discharge over past day. Pt reported having a new sexual partner and may have been exposed to STD this past weekend.  She denied odor or bleeding at present as she finished her menstrual cycle 2 days ago.She has had bacterial vaginosis in the past. She would like to have complete STD screening done.   Any abdominal discomfort,dysuria, change in her bowels, nausea or vomiting.   Pt also reports that her sore throat and nasal stuffy feeling continues. She admits to new antibiotic treatment from prior telephone visit. She admits to allergies to her dog that lives in her home and that she has not been using her Flonase and feels post nasal drip. Marland Kitchen  Past Medical History:  Diagnosis Date  . Anxiety   . Asthma   . Depression   . Ectopic pregnancy   . Irritable bowel syndrome (IBS) .  Marland Kitchen PTSD (post-traumatic stress disorder)     Past Surgical History:  Procedure Laterality Date  . TONSILLECTOMY    . TUBAL LIGATION      History reviewed. No pertinent family history.  Social History   Socioeconomic History  . Marital status: Single    Spouse name: Not on file  . Number of children: Not on file  . Years of education: Not on file  . Highest education level: Not on file  Occupational History  . Not on file  Tobacco Use  . Smoking status: Former Smoker    Packs/day: 0.50    Types: Cigarettes  . Smokeless tobacco: Never Used  Substance and Sexual Activity  . Alcohol use: Yes    Comment: social  .  Drug use: Not Currently    Types: Marijuana  . Sexual activity: Yes    Birth control/protection: None  Other Topics Concern  . Not on file  Social History Narrative  . Not on file   Social Determinants of Health   Financial Resource Strain:   . Difficulty of Paying Living Expenses: Not on file  Food Insecurity:   . Worried About Programme researcher, broadcasting/film/video in the Last Year: Not on file  . Ran Out of Food in the Last Year: Not on file  Transportation Needs:   . Lack of Transportation (Medical): Not on file  . Lack of Transportation (Non-Medical): Not on file  Physical Activity:   . Days of Exercise per Week: Not on file  . Minutes of Exercise per Session: Not on file  Stress:   . Feeling of Stress : Not on file  Social Connections:   . Frequency of Communication with Friends and Family: Not on file  . Frequency of Social Gatherings with Friends and Family: Not on file  . Attends Religious Services: Not on file  . Active Member of Clubs or Organizations: Not on file  . Attends Banker Meetings: Not on file  . Marital Status: Not on file  Intimate Partner Violence:   .  Fear of Current or Ex-Partner: Not on file  . Emotionally Abused: Not on file  . Physically Abused: Not on file  . Sexually Abused: Not on file    Outpatient Medications Prior to Visit  Medication Sig Dispense Refill  . amoxicillin (AMOXIL) 500 MG tablet Take 500 mg by mouth 2 (two) times daily.    . famotidine (PEPCID) 20 MG tablet Take 1 tablet (20 mg total) by mouth 2 (two) times daily. 60 tablet 2  . fluconazole (DIFLUCAN) 150 MG tablet Take 150 mg by mouth daily.    . sertraline (ZOLOFT) 50 MG tablet Take 1 tablet (50 mg total) by mouth at bedtime. 30 tablet 3  . atorvastatin (LIPITOR) 20 MG tablet Take 1 tablet (20 mg total) by mouth daily. (Patient not taking: Reported on 12/26/2019) 90 tablet 1  . clotrimazole (LOTRIMIN) 1 % cream Apply 1 application topically 2 (two) times daily. (Patient not  taking: Reported on 12/26/2019) 60 g 2  . amoxicillin (AMOXIL) 500 MG capsule Take 1 capsule (500 mg total) by mouth 2 (two) times daily. 14 capsule 0  . fluconazole (DIFLUCAN) 150 MG tablet Take 1 tablet repeat in 3 days if needed 2 tablet 0  . pantoprazole (PROTONIX) 40 MG tablet Take 1 tablet (40 mg total) by mouth 2 (two) times daily. (Patient not taking: Reported on 12/26/2019) 60 tablet 3   No facility-administered medications prior to visit.    Allergies  Allergen Reactions  . Latex Rash    Reaction to gloves    Review of Systems  All other systems reviewed and are negative.      Objective:    Physical Exam Vitals and nursing note reviewed. Exam conducted with a chaperone present.  Constitutional:      Appearance: Normal appearance. She is obese.  HENT:     Head: Normocephalic.     Right Ear: External ear normal.     Left Ear: External ear normal.     Nose: Mucosal edema present. No nasal deformity, septal deviation or nasal tenderness.     Right Turbinates: Not pale.     Left Turbinates: Not pale.     Right Sinus: No maxillary sinus tenderness or frontal sinus tenderness.     Left Sinus: No maxillary sinus tenderness or frontal sinus tenderness.     Comments: Positive PND    Mouth/Throat:     Mouth: Mucous membranes are dry.     Pharynx: Posterior oropharyngeal erythema present.  Eyes:     Extraocular Movements: Extraocular movements intact.     Conjunctiva/sclera: Conjunctivae normal.     Pupils: Pupils are equal, round, and reactive to light.  Cardiovascular:     Rate and Rhythm: Normal rate.     Pulses: Normal pulses.  Pulmonary:     Effort: Pulmonary effort is normal.  Genitourinary:    General: Normal vulva.     Vagina: Vaginal discharge present.     Rectum: Normal.     Comments: normal external genitalia, labia majora,  vaginal mucosa pink and moist, cervix visualized no growth, no blood form os, +white discharge , no CMT, no ovarian masses, uterus  normal size.  Skin:    General: Skin is warm and dry.  Neurological:     Mental Status: She is alert and oriented to person, place, and time.  Psychiatric:        Mood and Affect: Mood normal.        Behavior: Behavior normal.  BP 128/82   Pulse 88   Temp 98.1 F (36.7 C) (Temporal)   Resp 16   Ht 5\' 8"  (1.727 m)   Wt (!) 306 lb (138.8 kg)   SpO2 94%   BMI 46.53 kg/m  Wt Readings from Last 3 Encounters:  12/26/19 (!) 306 lb (138.8 kg)  10/15/19 (!) 312 lb (141.5 kg)  10/03/19 (!) 310 lb (140.6 kg)    There are no preventive care reminders to display for this patient.  There are no preventive care reminders to display for this patient.   Lab Results  Component Value Date   TSH 1.98 10/03/2019   Lab Results  Component Value Date   WBC 10.2 10/03/2019   HGB 13.7 10/03/2019   HCT 41.4 10/03/2019   MCV 84.3 10/03/2019   PLT 210 10/03/2019   Lab Results  Component Value Date   NA 139 10/03/2019   K 4.1 10/03/2019   CO2 26 10/03/2019   GLUCOSE 100 (H) 10/03/2019   BUN 11 10/03/2019   CREATININE 0.76 10/03/2019   BILITOT 0.5 10/03/2019   ALKPHOS 73 07/18/2016   AST 20 10/03/2019   ALT 54 (H) 10/03/2019   PROT 6.6 10/03/2019   ALBUMIN 3.9 07/18/2016   CALCIUM 9.0 10/03/2019   ANIONGAP 9 12/05/2018   Lab Results  Component Value Date   CHOL 210 (H) 10/03/2019   Lab Results  Component Value Date   HDL 45 (L) 10/03/2019   Lab Results  Component Value Date   LDLCALC 142 (H) 10/03/2019   Lab Results  Component Value Date   TRIG 114 10/03/2019   Lab Results  Component Value Date   CHOLHDL 4.7 10/03/2019   Lab Results  Component Value Date   HGBA1C 5.2 11/19/2015       Assessment & Plan:   Problem List Items Addressed This Visit Visit Diagnoses    Phayngitis: continue to stay the course with instructions form your visit this week (your sinusitis and post nasal drip could be contributing to your sore throat) RhinoSinusitis: Use your  Flonase (most likely allergic to dog dander) continue other allergy medications.    Screen for STD (sexually transmitted disease)   -Primary   Relevant Orders   HIV Antibody   RPR   HSV(herpes simplex vrs) 1+2 ab-IgG Relevant Orders  WET PREP FOR TRICH, YEAST, CLUE  C. trachomatis/N. gonorrhoeae RNA      Follow Up: As needed for health maintenance.   Follow up or seek medical attention for worsening or non resolving symptoms.   STD screening and diagnostics completed today and we will notify you with results.   Continue medications as prior to this visit.   Strep Tested today for continued sore throat with negative Covid results completed post visit earlier this week. Strep result was negative.   You are negative for Trichomoniasis, Bacterial vaginosis. I would advise you to take the Flagyl as prescribed.   No orders of the defined types were placed in this encounter.    Annie Main, FNP

## 2019-12-27 ENCOUNTER — Telehealth: Payer: Self-pay | Admitting: *Deleted

## 2019-12-27 LAB — C. TRACHOMATIS/N. GONORRHOEAE RNA
C. trachomatis RNA, TMA: NOT DETECTED
N. gonorrhoeae RNA, TMA: NOT DETECTED

## 2019-12-27 LAB — HSV(HERPES SIMPLEX VRS) I + II AB-IGG
HAV 1 IGG,TYPE SPECIFIC AB: <0.9 index
HSV 2 IGG,TYPE SPECIFIC AB: <0.9 index

## 2019-12-27 LAB — RPR: RPR Ser Ql: NONREACTIVE

## 2019-12-27 LAB — HEPATITIS PANEL, ACUTE
Hep A IgM: NONREACTIVE
Hep B C IgM: NONREACTIVE
Hepatitis B Surface Ag: NONREACTIVE
Hepatitis C Ab: NONREACTIVE
SIGNAL TO CUT-OFF: 0.02 (ref <1.00)

## 2019-12-27 LAB — HIV ANTIBODY (ROUTINE TESTING W REFLEX): HIV 1&2 Ab, 4th Generation: NONREACTIVE

## 2019-12-27 NOTE — Telephone Encounter (Signed)
Received VM from patient. Reports that she thinks she is having allergive reaction to ABTx.   Call immediately placed to patient.   Patient reports that she has had nausea and GI upset when taking amoxicillin before. States that it has not gotten any better even though she began taking ABTx with food. Also states that her throat feels very scratchy and feels like it is closing down.   Advised to go to ER for possible allergic reaction. Patient states that she cannot go to ER at this time as she is currently en route to job interview. Again stressed that any throat involvement with allergic reaction can be life threatening and she needs to go to ER now.   Verbalized understanding.

## 2019-12-28 LAB — CULTURE, GROUP A STREP
MICRO NUMBER:: 10091571
SPECIMEN QUALITY:: ADEQUATE

## 2019-12-28 LAB — STREP GROUP A AG, W/REFLEX TO CULT: Streptococcus, Group A Screen (Direct): NOT DETECTED

## 2019-12-29 ENCOUNTER — Encounter (HOSPITAL_COMMUNITY): Payer: Self-pay

## 2019-12-29 ENCOUNTER — Ambulatory Visit (HOSPITAL_COMMUNITY)
Admission: EM | Admit: 2019-12-29 | Discharge: 2019-12-29 | Disposition: A | Discharge status: Home / Self Care | Payer: Medicaid Other

## 2019-12-29 ENCOUNTER — Other Ambulatory Visit: Payer: Self-pay

## 2019-12-29 DIAGNOSIS — N898 Other specified noninflammatory disorders of vagina: Secondary | ICD-10-CM

## 2019-12-29 DIAGNOSIS — J3089 Other allergic rhinitis: Secondary | ICD-10-CM

## 2019-12-29 DIAGNOSIS — H938X2 Other specified disorders of left ear: Secondary | ICD-10-CM

## 2019-12-29 DIAGNOSIS — R42 Dizziness and giddiness: Secondary | ICD-10-CM

## 2019-12-29 DIAGNOSIS — R509 Fever, unspecified: Secondary | ICD-10-CM

## 2019-12-29 DIAGNOSIS — R070 Pain in throat: Secondary | ICD-10-CM

## 2019-12-29 LAB — POCT URINALYSIS DIP (DEVICE)
Bilirubin Urine: NEGATIVE
Glucose, UA: NEGATIVE mg/dL
Hgb urine dipstick: NEGATIVE
Ketones, ur: NEGATIVE mg/dL
Leukocytes,Ua: NEGATIVE
Nitrite: NEGATIVE
Protein, ur: NEGATIVE mg/dL
Specific Gravity, Urine: 1.025 (ref 1.005–1.030)
Urobilinogen, UA: 0.2 mg/dL (ref 0.0–1.0)
pH: 5.5 (ref 5.0–8.0)

## 2019-12-29 LAB — GLUCOSE, CAPILLARY: Glucose-Capillary: 103 mg/dL — ABNORMAL HIGH (ref 70–99)

## 2019-12-29 LAB — CBG MONITORING, ED
Glucose-Capillary: 103 mg/dL — ABNORMAL HIGH (ref 70–99)
Glucose-Capillary: 103 mg/dL — ABNORMAL HIGH (ref 70–99)

## 2019-12-29 MED ORDER — PSEUDOEPHEDRINE HCL 60 MG PO TABS: 60.0000 mg | ORAL_TABLET | Freq: Three times a day (TID) | ORAL | 0 refills | Status: DC | PRN

## 2019-12-29 MED ORDER — CETIRIZINE HCL 10 MG PO TABS: 10.0000 mg | ORAL_TABLET | Freq: Every day | ORAL | 0 refills | Status: DC

## 2019-12-29 MED ORDER — MECLIZINE HCL 12.5 MG PO TABS: 12.5000 mg | ORAL_TABLET | Freq: Three times a day (TID) | ORAL | 0 refills | Status: DC | PRN

## 2019-12-29 MED ORDER — FLUCONAZOLE 200 MG PO TABS: 200.0000 mg | ORAL_TABLET | Freq: Once | ORAL | 0 refills | Status: AC

## 2019-12-29 NOTE — ED Provider Notes (Signed)
[facility]   MRN: [id number] DOB: May 27, 1981  Subjective:   [name] is a 39 y.o. female presenting for 3 day history of acute onset of dizziness, worse with changing positions, room spinning sensation when she closes her eyes. Has had throat pain intermittently, had left ear fullness from getting water in her ear. She did a telephone visit, was started on amoxicillin for ear and throat infection on 12/23/2019 without being seen. Also got one dose of fluconazole. She was able to take amoxicillin for 2-3 days. Of note, patient did go in and be seen for STI check on 12/26/2019, had STI testing, negative strep culture. Had negative COVID 19 testing. Has a history of allergic rhinitis. Has a dog at home, uses Flonase.  Has been advised that she has significant allergies and her dog is likely the source of this.  However, she has not kept up with her allergy medications consistently.  Does not drink water, drinks ginger ale, tea or soda.   No current facility-administered medications for this encounter.  Current Outpatient Medications:  .  amoxicillin (AMOXIL) 500 MG tablet, Take 500 mg by mouth 2 (two) times daily., Disp: , Rfl:  .  atorvastatin (LIPITOR) 20 MG tablet, Take 1 tablet (20 mg total) by mouth daily. (Patient not taking: Reported on 12/26/2019), Disp: 90 tablet, Rfl: 1 .  buPROPion (WELLBUTRIN XL) 150 MG 24 hr tablet, Take 150 mg by mouth every morning., Disp: , Rfl:  .  busPIRone (BUSPAR) 15 MG tablet, Take 15 mg by mouth 2 (two) times daily., Disp: , Rfl:  .  clotrimazole (LOTRIMIN) 1 % cream, Apply 1 application topically 2 (two) times daily. (Patient not taking: Reported on 12/26/2019), Disp: 60 g, Rfl: 2 .  famotidine (PEPCID) 20 MG tablet, Take 1 tablet (20 mg total) by mouth 2 (two) times daily., Disp: 60 tablet, Rfl: 2 .  fluconazole (DIFLUCAN) 150 MG tablet, Take 150 mg by mouth daily., Disp: , Rfl:  .  sertraline (ZOLOFT) 50 MG tablet, Take 1 tablet (50 mg total) by  mouth at bedtime., Disp: 30 tablet, Rfl: 3   Allergies  Allergen Reactions  . Latex Rash    Reaction to gloves    Past Medical History:  Diagnosis Date  . Anxiety   . Asthma   . Depression   . Ectopic pregnancy   . Irritable bowel syndrome (IBS) .  Marland Kitchen PTSD (post-traumatic stress disorder)      Past Surgical History:  Procedure Laterality Date  . TONSILLECTOMY    . TUBAL LIGATION      History reviewed. No pertinent family history.  Social History   Tobacco Use  . Smoking status: Former Smoker    Packs/day: 0.50    Types: Cigarettes  . Smokeless tobacco: Never Used  Substance Use Topics  . Alcohol use: Yes    Comment: social  . Drug use: Not Currently    Types: Marijuana    Review of Systems  Constitutional: Positive for fever. Negative for malaise/fatigue.  HENT: Positive for ear pain and sore throat. Negative for congestion, ear discharge, sinus pain and tinnitus.   Eyes: Negative for discharge and redness.  Respiratory: Negative for cough, hemoptysis, shortness of breath and wheezing.   Cardiovascular: Negative for chest pain.  Gastrointestinal: Negative for abdominal pain, diarrhea, nausea and vomiting.  Genitourinary: Negative for dysuria, flank pain and hematuria.  Musculoskeletal: Negative for myalgias.  Skin: Negative for itching and rash.  Neurological: Positive for dizziness. Negative for  weakness and headaches.  Psychiatric/Behavioral: Negative for depression and substance abuse.     Objective:   Vitals: BP 106/75 (BP Location: Right Arm)   Pulse 82   Temp 97.7 F (36.5 C)   Resp 20   LMP  (Within Weeks) Comment: 1 week   SpO2 100%   Physical Exam Constitutional:      General: She is not in acute distress.    Appearance: She is well-developed. She is obese. She is not ill-appearing, toxic-appearing or diaphoretic.  HENT:     Head: Normocephalic and atraumatic.     Right Ear: Tympanic membrane and ear canal normal. No drainage or  tenderness. No middle ear effusion. Tympanic membrane is not erythematous.     Left Ear: Tympanic membrane and ear canal normal. No drainage or tenderness.  No middle ear effusion. Tympanic membrane is not erythematous.     Nose: No congestion or rhinorrhea.     Mouth/Throat:     Mouth: Mucous membranes are moist. No oral lesions.     Pharynx: Oropharynx is clear. No pharyngeal swelling, oropharyngeal exudate, posterior oropharyngeal erythema or uvula swelling.     Tonsils: No tonsillar exudate or tonsillar abscesses.  Eyes:     General: No scleral icterus.       Right eye: No discharge.        Left eye: No discharge.     Extraocular Movements:     Right eye: Normal extraocular motion.     Left eye: Normal extraocular motion.     Conjunctiva/sclera: Conjunctivae normal.     Pupils: Pupils are equal, round, and reactive to light.  Cardiovascular:     Rate and Rhythm: Normal rate.  Pulmonary:     Effort: Pulmonary effort is normal.  Musculoskeletal:     Cervical back: Normal range of motion and neck supple.  Lymphadenopathy:     Cervical: No cervical adenopathy.  Skin:    General: Skin is warm and dry.  Neurological:     General: No focal deficit present.     Mental Status: She is alert and oriented to person, place, and time.     Cranial Nerves: No cranial nerve deficit.     Motor: No weakness.     Coordination: Coordination normal.     Gait: Gait normal.     Deep Tendon Reflexes: Reflexes normal.  Psychiatric:        Mood and Affect: Mood normal.        Behavior: Behavior normal.        Thought Content: Thought content normal.        Judgment: Judgment normal.     Results for orders placed or performed during the hospital encounter of 12/29/19 (from the past 24 hour(s))  POC CBG monitoring     Status: Abnormal   Collection Time: 12/29/19 11:24 AM  Result Value Ref Range   Glucose-Capillary 103 (H) 70 - 99 mg/dL  Glucose, capillary     Status: Abnormal   Collection  Time: 12/29/19 11:24 AM  Result Value Ref Range   Glucose-Capillary 103 (H) 70 - 99 mg/dL  CBG monitoring, ED     Status: Abnormal   Collection Time: 12/29/19 11:24 AM  Result Value Ref Range   Glucose-Capillary 103 (H) 70 - 99 mg/dL  POCT urinalysis dip (device)     Status: None   Collection Time: 12/29/19 11:32 AM  Result Value Ref Range   Glucose, UA NEGATIVE NEGATIVE mg/dL   Bilirubin Urine NEGATIVE  NEGATIVE   Ketones, ur NEGATIVE NEGATIVE mg/dL   Specific Gravity, Urine 1.025 1.005 - 1.030   Hgb urine dipstick NEGATIVE NEGATIVE   pH 5.5 5.0 - 8.0   Protein, ur NEGATIVE NEGATIVE mg/dL   Urobilinogen, UA 0.2 0.0 - 1.0 mg/dL   Nitrite NEGATIVE NEGATIVE   Leukocytes,Ua NEGATIVE NEGATIVE    Assessment and Plan :   1. Dizziness   2. Throat pain   3. Vertigo   4. Ear fullness, left   5. Feels feverish   6. Vaginal itching   7. Allergic rhinitis due to other allergic trigger, unspecified seasonality     Suspect dizziness is combination of dehydration, eustachian tube dysfunction.  Counseled patient on lifestyle and dietary modifications.  Will have her maintain Flonase, start Zyrtec and use pseudoephedrine as needed.  Lab review completed with patient, had negative testing in the past 10 days for COVID-19, STIs, strep.  Will cover for yeast vaginitis associated with antibiotic use given that she did take amoxicillin.  Counseled patient on potential for adverse effects with medications prescribed/recommended today, ER and return-to-clinic precautions discussed, patient verbalized understanding.    Jaynee Eagles, PA-C 12/29/19 1211

## 2019-12-29 NOTE — ED Triage Notes (Signed)
Pt reports she was taking amoxicillin for ear infection, she stopped taking 2 days ago. Pt reports feeling dizzy with low energy; "burning sensation from my throat to my groins",  vaginal itching x 2 days. Pt is talking without any breathing difficulty.

## 2019-12-29 NOTE — Discharge Instructions (Signed)
Hydrate well with at least 2 liters (64 ounces) of water daily. For diabetes, please make sure you are avoiding starchy, carbohydrate foods like pasta, breads, pastry, rice, potatoes, desserts. These foods can elevated your blood sugar. Also, limit your alcohol drinking to 1 per day, avoid sodas, sweet teas. For elevated blood pressure, make sure you are monitoring salt in your diet.  Do not eat restaurant foods and limit processed foods at home.  Processed foods include things like frozen meals preseason meats and dinners.  Make sure your pain attention to sodium labels on foods you by at the grocery store.  For seasoning you can use a brand called Mrs. Dash which includes a lot of salt free seasonings.  Salads - kale, spinach, cabbage, spring mix; use seeds like pumpkin seeds or sunflower seeds, almonds; you can also use 1-2 hard boiled eggs in your salads Fruits - avocadoes, berries (blueberries, raspberries, blackberries), apples, oranges Vegetables - aspargus, cauliflower, broccoli, green beans, brussel spouts, bell peppers; stay away from starchy vegetables like potatoes, carrots, peas  Regarding meat it is better to eat lean meats and limit your red meat consumption including pork.  Wild caught fish, chicken breast are good options.  Do not eat any foods on this list that you are allergic to.  

## 2019-12-30 NOTE — Progress Notes (Signed)
Let pt know HSV (herpes simplex VRS 1 and 2 negative. Other lab results are still pending.

## 2020-01-01 LAB — HSV 1/2 AB (IGM), IFA W/RFLX TITER
HSV 1 IgM Screen: NEGATIVE
HSV 2 IgM Screen: NEGATIVE

## 2020-01-13 ENCOUNTER — Ambulatory Visit: Payer: Self-pay | Admitting: Family Medicine

## 2020-01-17 ENCOUNTER — Other Ambulatory Visit: Payer: Self-pay

## 2020-01-17 ENCOUNTER — Ambulatory Visit (INDEPENDENT_AMBULATORY_CARE_PROVIDER_SITE_OTHER): Payer: Medicaid Other | Admitting: Family Medicine

## 2020-01-17 ENCOUNTER — Ambulatory Visit: Payer: Self-pay | Admitting: Family Medicine

## 2020-01-17 ENCOUNTER — Encounter: Payer: Self-pay | Admitting: Family Medicine

## 2020-01-17 VITALS — BP 128/74 | HR 78 | Temp 97.8°F | Resp 18 | Ht 68.0 in | Wt 311.0 lb

## 2020-01-17 DIAGNOSIS — J309 Allergic rhinitis, unspecified: Secondary | ICD-10-CM

## 2020-01-17 DIAGNOSIS — J019 Acute sinusitis, unspecified: Secondary | ICD-10-CM | POA: Diagnosis not present

## 2020-01-17 DIAGNOSIS — F411 Generalized anxiety disorder: Secondary | ICD-10-CM | POA: Diagnosis not present

## 2020-01-17 DIAGNOSIS — F3132 Bipolar disorder, current episode depressed, moderate: Secondary | ICD-10-CM | POA: Diagnosis not present

## 2020-01-17 DIAGNOSIS — B9689 Other specified bacterial agents as the cause of diseases classified elsewhere: Secondary | ICD-10-CM

## 2020-01-17 MED ORDER — CEFDINIR 300 MG PO CAPS: 300.0000 mg | ORAL_CAPSULE | Freq: Two times a day (BID) | ORAL | 0 refills | Status: DC

## 2020-01-17 MED ORDER — LEVOCETIRIZINE DIHYDROCHLORIDE 5 MG PO TABS: 5.0000 mg | ORAL_TABLET | Freq: Every evening | ORAL | 0 refills | Status: DC

## 2020-01-17 MED ORDER — PREDNISONE 20 MG PO TABS: ORAL_TABLET | ORAL | 0 refills | Status: DC

## 2020-01-17 NOTE — Progress Notes (Signed)
Subjective:    Patient ID: [name], female    DOB: 1981/11/19, 39 y.o.   MRN: [id number]  HPI  Patient has been dealing with her sinuses now for over a month.  She blames her dog.  She believes she may be allergic to dogs.  She has had this dog for over a year however she recently just discontinued smoking marijuana and she now believes "that she may be noticing symptoms of her allergies more".  However she now reports constant pain and pressure in the center of her forehead near her ethmoid sinus.  She also reports pain and pressure in her frontal sinuses and in her maxillary sinuses.  She reports pain and pressure in both ears.  She reports a popping sensation in her eustachian tube.  She reports congestion that is only relieved with Flonase.  She reports postnasal drip and occasional cough due to the postnasal drip.  She reports a scratchy itchy throat due to postnasal drainage.  She denies any rhinorrhea.  She denies any shortness of breath.  She denies any wheezing.  She denies any fevers or chills.  She was initially started on amoxicillin by one of my partners however she had to discontinue the medication after 3 days due to a burning sensation all over her body.  She then went to an urgent care where she was started on Flonase, Zyrtec with minimal relief.  The symptoms have now been present according to the patient's account for more than a month.  She sounds like she has a sinus infection triggered by allergies however she refuses to get rid of the dog. Past Medical History:  Diagnosis Date  . Anxiety   . Asthma   . Depression   . Ectopic pregnancy   . Irritable bowel syndrome (IBS) .  Marland Kitchen PTSD (post-traumatic stress disorder)    Past Surgical History:  Procedure Laterality Date  . TONSILLECTOMY    . TUBAL LIGATION     Current Outpatient Medications on File Prior to Visit  Medication Sig Dispense Refill  . cetirizine (ZYRTEC ALLERGY) 10 MG tablet Take 1 tablet (10 mg total) by  mouth daily. 90 tablet 0  . famotidine (PEPCID) 20 MG tablet Take 1 tablet (20 mg total) by mouth 2 (two) times daily. 60 tablet 2  . pseudoephedrine (SUDAFED) 60 MG tablet Take 1 tablet (60 mg total) by mouth every 8 (eight) hours as needed for congestion. 30 tablet 0  . sertraline (ZOLOFT) 50 MG tablet Take 1 tablet (50 mg total) by mouth at bedtime. 30 tablet 3  . [DISCONTINUED] atorvastatin (LIPITOR) 20 MG tablet Take 1 tablet (20 mg total) by mouth daily. (Patient not taking: Reported on 12/26/2019) 90 tablet 1   No current facility-administered medications on file prior to visit.   Allergies  Allergen Reactions  . Latex Rash    Reaction to gloves   Social History   Socioeconomic History  . Marital status: Single    Spouse name: Not on file  . Number of children: Not on file  . Years of education: Not on file  . Highest education level: Not on file  Occupational History  . Not on file  Tobacco Use  . Smoking status: Former Smoker    Packs/day: 0.50    Types: Cigarettes  . Smokeless tobacco: Never Used  Substance and Sexual Activity  . Alcohol use: Yes    Comment: social  . Drug use: Not Currently    Types: Marijuana  .  Sexual activity: Yes    Birth control/protection: None  Other Topics Concern  . Not on file  Social History Narrative  . Not on file   Social Determinants of Health   Financial Resource Strain:   . Difficulty of Paying Living Expenses: Not on file  Food Insecurity:   . Worried About Programme researcher, broadcasting/film/video in the Last Year: Not on file  . Ran Out of Food in the Last Year: Not on file  Transportation Needs:   . Lack of Transportation (Medical): Not on file  . Lack of Transportation (Non-Medical): Not on file  Physical Activity:   . Days of Exercise per Week: Not on file  . Minutes of Exercise per Session: Not on file  Stress:   . Feeling of Stress : Not on file  Social Connections:   . Frequency of Communication with Friends and Family: Not on  file  . Frequency of Social Gatherings with Friends and Family: Not on file  . Attends Religious Services: Not on file  . Active Member of Clubs or Organizations: Not on file  . Attends Banker Meetings: Not on file  . Marital Status: Not on file  Intimate Partner Violence:   . Fear of Current or Ex-Partner: Not on file  . Emotionally Abused: Not on file  . Physically Abused: Not on file  . Sexually Abused: Not on file     Review of Systems  All other systems reviewed and are negative.      Objective:   Physical Exam Vitals reviewed.  Constitutional:      General: She is not in acute distress.    Appearance: She is obese. She is not ill-appearing, toxic-appearing or diaphoretic.  HENT:     Right Ear: Tympanic membrane and ear canal normal.     Left Ear: Tympanic membrane and ear canal normal.     Nose: Mucosal edema and congestion present. No rhinorrhea.     Right Turbinates: Enlarged and swollen.     Left Turbinates: Enlarged and swollen.     Right Sinus: Maxillary sinus tenderness and frontal sinus tenderness present.     Left Sinus: Maxillary sinus tenderness and frontal sinus tenderness present.     Mouth/Throat:     Pharynx: Oropharynx is clear. No oropharyngeal exudate.  Eyes:     Conjunctiva/sclera: Conjunctivae normal.  Cardiovascular:     Rate and Rhythm: Normal rate and regular rhythm.     Heart sounds: Normal heart sounds.  Pulmonary:     Effort: Pulmonary effort is normal. No respiratory distress.     Breath sounds: Normal breath sounds. No wheezing or rales.  Neurological:     Mental Status: She is alert.           Assessment & Plan:  Allergic rhinitis, unspecified seasonality, unspecified trigger  Acute bacterial rhinosinusitis  Initially I believe the patient's problems are triggered by her allergies most likely to her Pap.  However now I believe she has developed a secondary sinus infection given the pain that she is experiencing  in her sinuses.  She has tried and failed Zyrtec Flonase as well as 1 month of tincture of time.  I will start the patient on a prednisone taper pack coupled with Omnicef 300 mg p.o. twice daily for 10 days.  I have recommended that she avoid any allergic triggers.  She could also switch from Zyrtec to Xyzal to see if this may work better for her.  Reassess in  10 days or sooner if worsening.  Patient may have to reconsider the presence of the dog if she continues to deal with chronic allergic sinusitis

## 2020-01-20 ENCOUNTER — Other Ambulatory Visit: Payer: Medicaid Other | Admitting: Family Medicine

## 2020-01-22 IMAGING — CT CT ANGIO CHEST
2 of 7 series · 18 of 46 positions shown · IV contrast (APPLIED)
Comparison: CXR 12/05/2018

CLINICAL DATA: Central chest pain x1 week with asthma.

EXAM:
CT ANGIOGRAPHY CHEST WITH CONTRAST
TECHNIQUE: Multidetector CT imaging of the chest was performed using the
standard protocol during bolus administration of intravenous
contrast. Multiplanar CT image reconstructions and MIPs were
obtained to evaluate the vascular anatomy.
CONTRAST:  65 cc 41DGPK-4AC IOPAMIDOL (41DGPK-4AC) INJECTION 76%

[Series 8: thins · axial · 0.81mm/px · z∈[+1056,+1292]mm · 15 of 381 slices shown]
[im 22/381  lung]
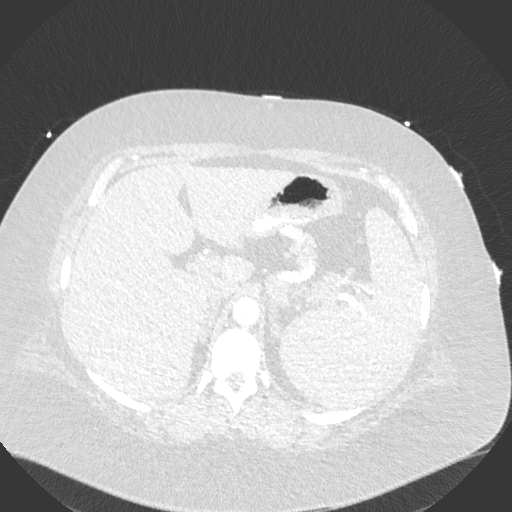
[im 43/381  soft-tissue]
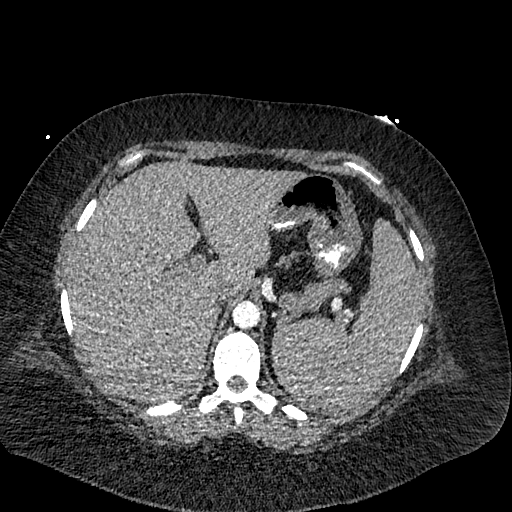
[im 64/381  lung]
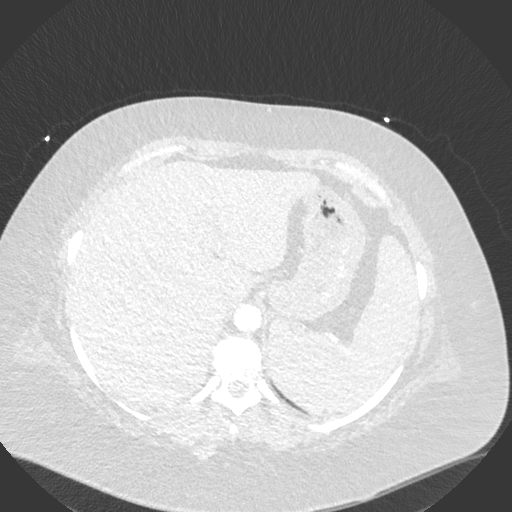
[im 85/381  soft-tissue]
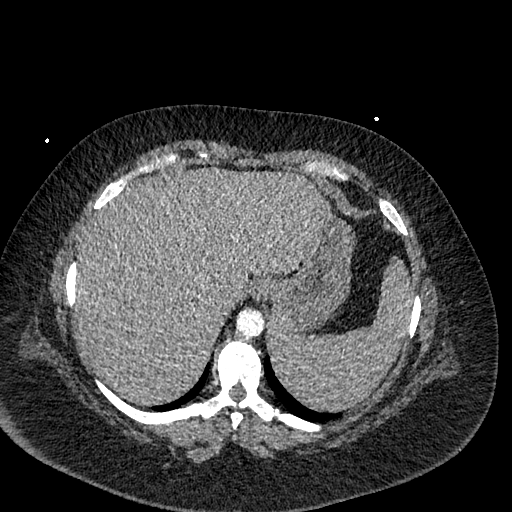
[im 127/381  lung]
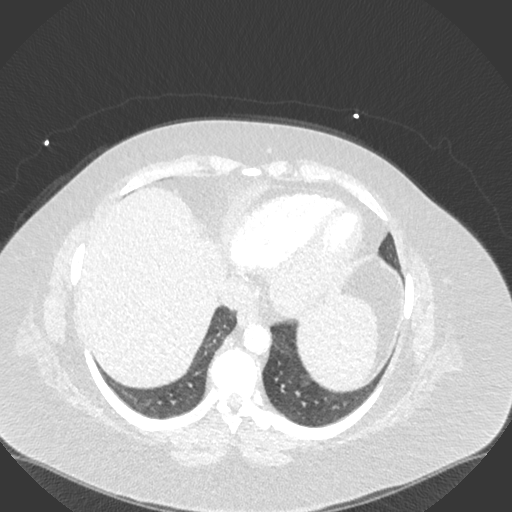
[im 148/381  soft-tissue]
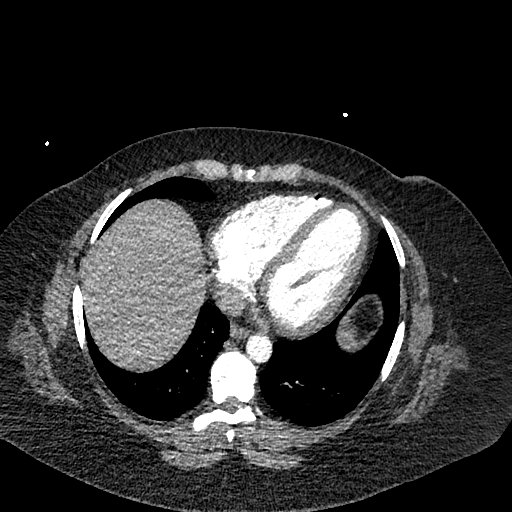
[im 169/381  lung]
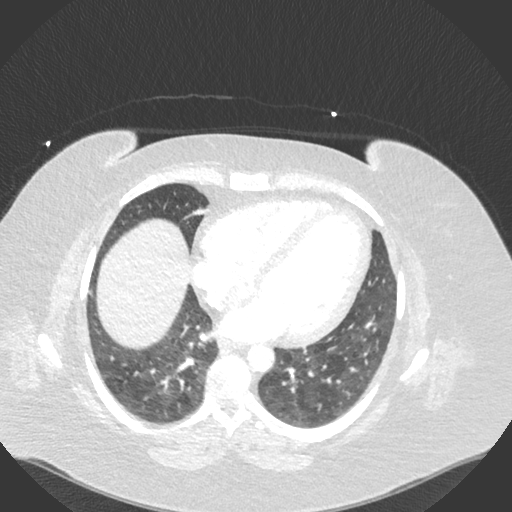
[im 191/381  soft-tissue]
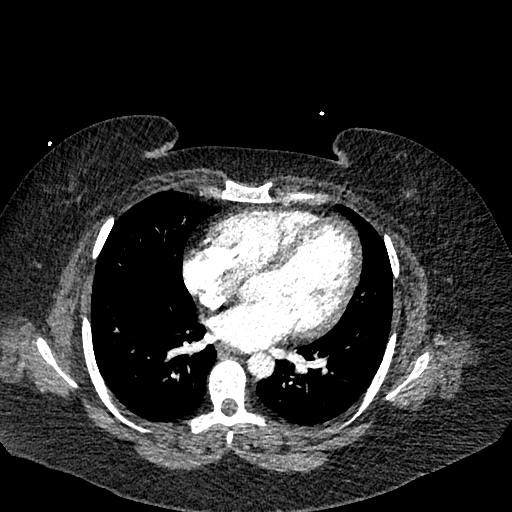
[im 212/381  lung]
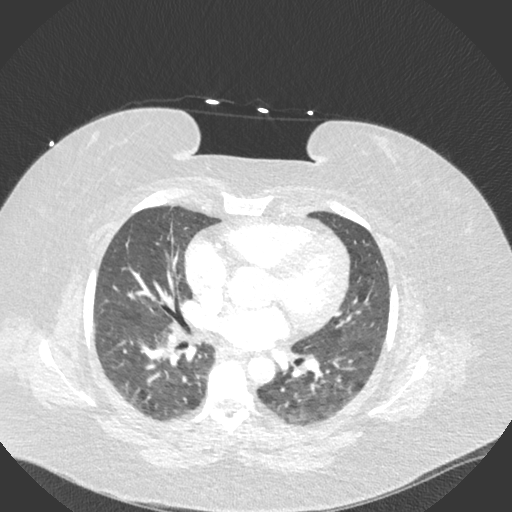
[im 233/381  soft-tissue]
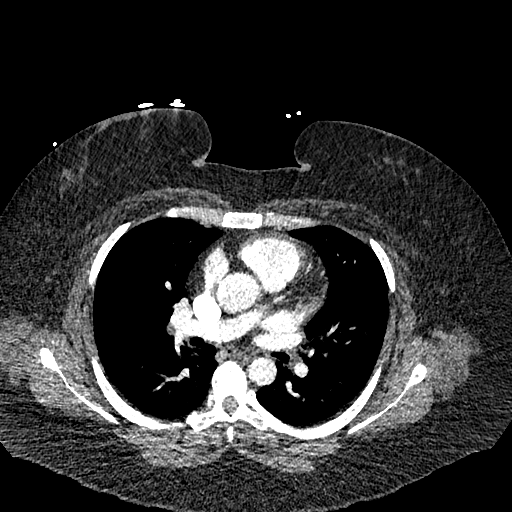
[im 254/381  lung]
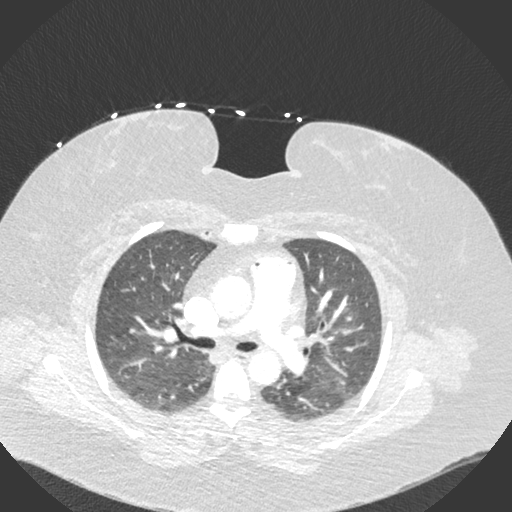
[im 296/381  soft-tissue]
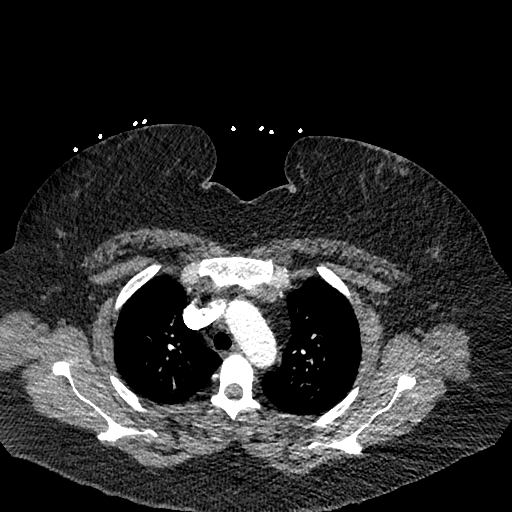
[im 317/381  lung]
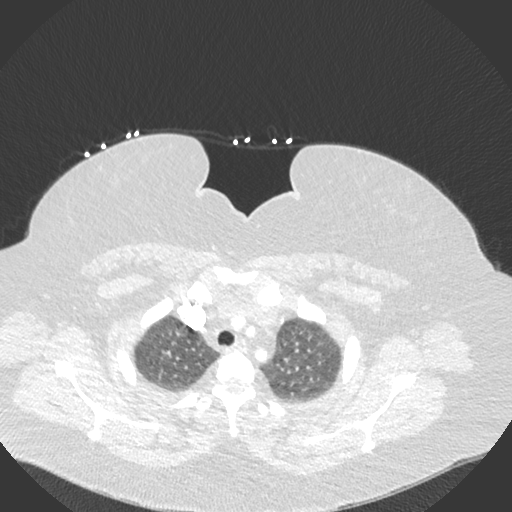
[im 338/381  soft-tissue]
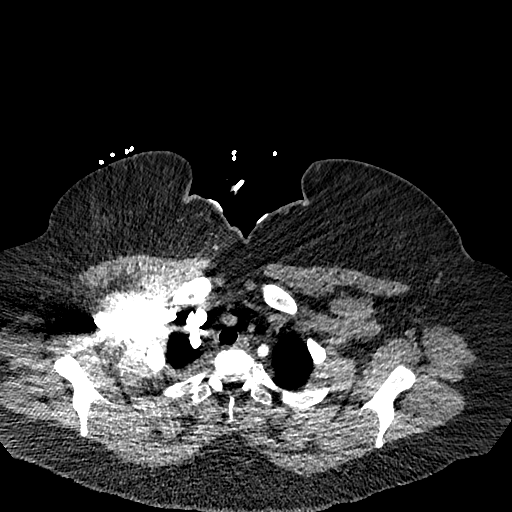
[im 359/381  lung]
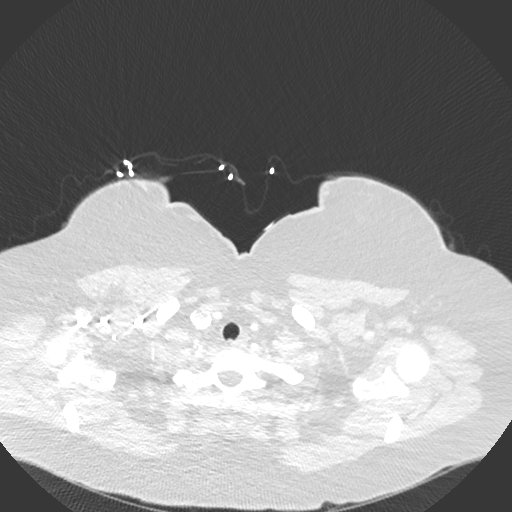

[Series 9: cor · coronal · 0.52mm/px · 3 of 145 slices shown]
[im 37/145  soft-tissue]
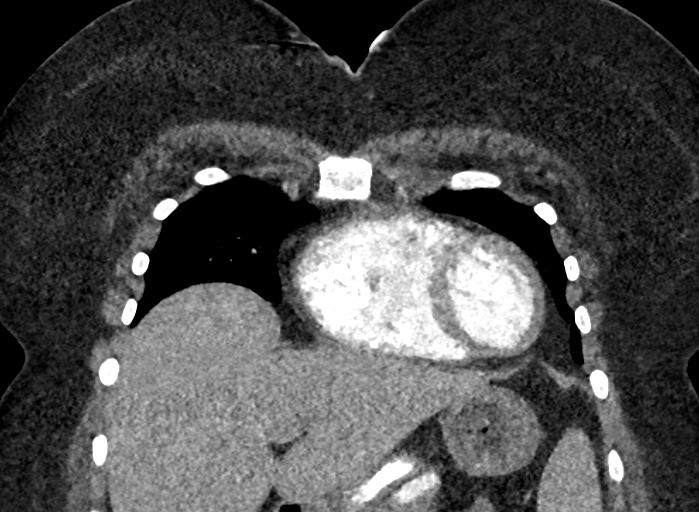
[im 73/145  soft-tissue]
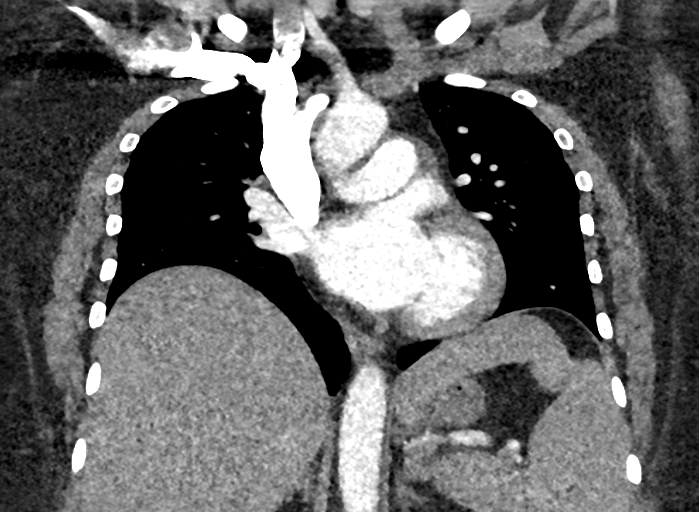
[im 109/145  soft-tissue]
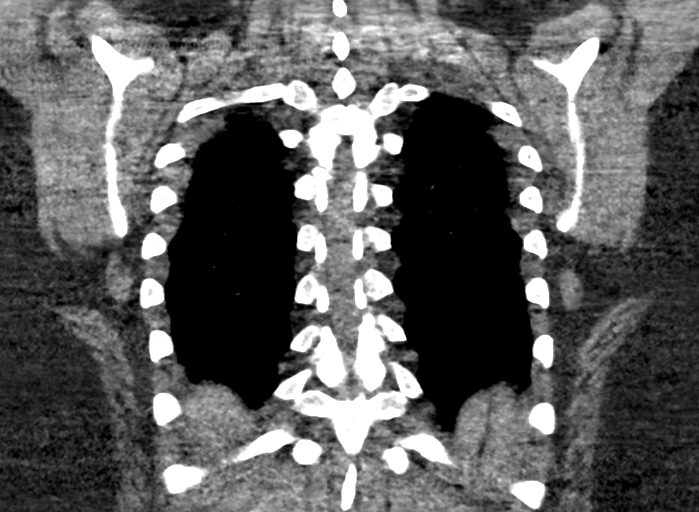

[18 of 46 positions shown; findings below may reference images not displayed]

FINDINGS: Cardiovascular: The study is of quality for the evaluation of
pulmonary embolism. There are no filling defects in the central,
lobar, segmental or subsegmental pulmonary artery branches to
suggest acute pulmonary embolism. Great vessels are normal in course
and caliber. Normal heart size. No significant pericardial
fluid/thickening. Nonaneurysmal thoracic aorta without dissection

Mediastinum/Nodes: No discrete thyroid nodules. Unremarkable
esophagus. No pathologically enlarged axillary, mediastinal or hilar
lymph nodes.

Lungs/Pleura: No pneumothorax. No pleural effusion. Slight mosaic
attenuation of the lungs bilaterally.

Upper abdomen: Unremarkable.

Musculoskeletal:  No aggressive appearing focal osseous lesions.

Review of the MIP images confirms the above findings.
IMPRESSION: 1. No acute pulmonary embolus.
2. Slight mosaic attenuation of the lungs bilaterally which can be
seen in the setting of asthma or small airway disease/inflammation.

## 2020-02-04 ENCOUNTER — Other Ambulatory Visit: Payer: Self-pay

## 2020-02-04 ENCOUNTER — Ambulatory Visit
Admission: RE | Admit: 2020-02-04 | Discharge: 2020-02-04 | Disposition: A | Discharge status: Home / Self Care | Payer: Medicaid Other | Source: Ambulatory Visit | Attending: Family Medicine | Admitting: Family Medicine

## 2020-02-04 DIAGNOSIS — Z1231 Encounter for screening mammogram for malignant neoplasm of breast: Secondary | ICD-10-CM

## 2020-02-11 DIAGNOSIS — F3132 Bipolar disorder, current episode depressed, moderate: Secondary | ICD-10-CM | POA: Diagnosis not present

## 2020-02-11 DIAGNOSIS — F411 Generalized anxiety disorder: Secondary | ICD-10-CM | POA: Diagnosis not present

## 2020-03-06 ENCOUNTER — Other Ambulatory Visit: Payer: Self-pay | Admitting: Family Medicine

## 2020-03-06 MED ORDER — FLUTICASONE PROPIONATE 50 MCG/ACT NA SUSP: 2.0000 | Freq: Every day | NASAL | 6 refills | Status: DC

## 2020-03-10 ENCOUNTER — Ambulatory Visit: Payer: Medicaid Other | Admitting: Nurse Practitioner

## 2020-03-16 ENCOUNTER — Telehealth (INDEPENDENT_AMBULATORY_CARE_PROVIDER_SITE_OTHER): Payer: Medicaid Other | Admitting: Nurse Practitioner

## 2020-03-16 DIAGNOSIS — B9689 Other specified bacterial agents as the cause of diseases classified elsewhere: Secondary | ICD-10-CM | POA: Diagnosis not present

## 2020-03-16 DIAGNOSIS — N760 Acute vaginitis: Secondary | ICD-10-CM

## 2020-03-16 MED ORDER — METRONIDAZOLE 500 MG PO TABS: 500.0000 mg | ORAL_TABLET | Freq: Two times a day (BID) | ORAL | 0 refills | Status: AC

## 2020-03-16 NOTE — Progress Notes (Signed)
Virtual Visit via Video Note  I connected with [name] on 03/16/20 at 03:30 PM by a video enabled telemedicine application and verified that I am speaking with the correct person using two identifiers.   I discussed the limitations of evaluation and management by telemedicine and the availability of in person appointments. The patient expressed understanding and agreed to proceed.  History of Present Illness: Pt is a 39 year old female 1 week having vaginal irritation with increasing vaginal discharge. She has had BV a few times in the past and is having the same sxs. On COVID precaution r/t cousin testing positive two days ago.    Observations/Objective: No SOB, No difficulty speaking, does not appear I ll or toxic, a/ox4  Past Medical History:  Diagnosis Date  . Anxiety   . Asthma   . Depression   . Ectopic pregnancy   . Irritable bowel syndrome (IBS) .  Marland Kitchen PTSD (post-traumatic stress disorder)    Past Surgical History:  Procedure Laterality Date  . TONSILLECTOMY    . TUBAL LIGATION     Allergies  Allergen Reactions  . Latex Rash    Reaction to gloves   Bacterial vaginosis Assessment and Plan: BV treatment start and completed following up for non resolving or worsening sxs.  Covid test and quarantine already in place as stated.  Follow Up Instructions:    I discussed the assessment and treatment plan with the patient. The patient was provided an opportunity to ask questions and all were answered. The patient agreed with the plan and demonstrated an understanding of the instructions.   The patient was advised to call back or seek an in-person evaluation if the symptoms worsen or if the condition fails to improve as anticipated.  I provided 10 minutes of non-face-to-face time during this encounter. End Time 03:40  Elmore Guise, FNP

## 2020-03-18 ENCOUNTER — Ambulatory Visit: Payer: Medicaid Other | Attending: Internal Medicine

## 2020-03-18 DIAGNOSIS — Z20822 Contact with and (suspected) exposure to covid-19: Secondary | ICD-10-CM

## 2020-03-19 LAB — SARS-COV-2, NAA 2 DAY TAT

## 2020-03-19 LAB — NOVEL CORONAVIRUS, NAA: SARS-CoV-2, NAA: NOT DETECTED

## 2020-04-08 DIAGNOSIS — F3132 Bipolar disorder, current episode depressed, moderate: Secondary | ICD-10-CM | POA: Diagnosis not present

## 2020-04-08 DIAGNOSIS — F411 Generalized anxiety disorder: Secondary | ICD-10-CM | POA: Diagnosis not present

## 2020-04-29 ENCOUNTER — Ambulatory Visit (INDEPENDENT_AMBULATORY_CARE_PROVIDER_SITE_OTHER): Payer: Medicaid Other | Admitting: Nurse Practitioner

## 2020-04-29 ENCOUNTER — Other Ambulatory Visit: Payer: Self-pay

## 2020-04-29 VITALS — BP 118/64 | HR 87 | Temp 97.6°F | Resp 18 | Wt 310.8 lb

## 2020-04-29 DIAGNOSIS — J0110 Acute frontal sinusitis, unspecified: Secondary | ICD-10-CM | POA: Diagnosis not present

## 2020-04-29 MED ORDER — LEVOCETIRIZINE DIHYDROCHLORIDE 5 MG PO TABS: 5.0000 mg | ORAL_TABLET | Freq: Every evening | ORAL | 3 refills | Status: DC

## 2020-04-29 MED ORDER — AZITHROMYCIN 250 MG PO TABS: ORAL_TABLET | ORAL | 0 refills | Status: DC

## 2020-04-29 NOTE — Patient Instructions (Signed)
Your symptoms are consistent with frontal sinusitis, get plenty of rest, drink plenty of water, use your Flonase 2 sprays each nostril once a day for 5 days and continue 1 spray to each nostril once a day for the rest of the spring, continue taking your daily antihistamine, start and complete antibiotic.

## 2020-04-29 NOTE — Progress Notes (Signed)
Acute Office Visit  Subjective:    Patient ID: [name], female    DOB: 1981-01-14, 39 y.o.   MRN: [id number]  Chief Complaint  Patient presents with  . Dizziness    started x1 week, felt thickening in back of throat, sinus pressure, headache, tension on eyes, flonase and allergy meds taken    HPI Patient is a 39 year old female presenting for sxs of nasal congestion, post nasal drip, nasal drip. Denied fever, chills, loss of taste, loss of smell. Reports that over last few days she has frontal sinus pressure. She has used her Flonase and antihistamine on and off but not consistently. She has no sick contacts. She has a known h/o seasonal allergies.   No cp, ct, gu, gu sxs, pain, sob, edema, palpitation, or falls.   Past Medical History:  Diagnosis Date  . Anxiety   . Asthma   . Depression   . Ectopic pregnancy   . Irritable bowel syndrome (IBS) .  Marland Kitchen PTSD (post-traumatic stress disorder)     Past Surgical History:  Procedure Laterality Date  . TONSILLECTOMY    . TUBAL LIGATION      No family history on file.  Social History   Socioeconomic History  . Marital status: Single    Spouse name: Not on file  . Number of children: Not on file  . Years of education: Not on file  . Highest education level: Not on file  Occupational History  . Not on file  Tobacco Use  . Smoking status: Former Smoker    Packs/day: 0.50    Types: Cigarettes  . Smokeless tobacco: Never Used  Substance and Sexual Activity  . Alcohol use: Yes    Comment: social  . Drug use: Not Currently    Types: Marijuana  . Sexual activity: Yes    Birth control/protection: None  Other Topics Concern  . Not on file  Social History Narrative  . Not on file   Social Determinants of Health   Financial Resource Strain:   . Difficulty of Paying Living Expenses:   Food Insecurity:   . Worried About Programme researcher, broadcasting/film/video in the Last Year:   . Barista in the Last Year:   Transportation Needs:    . Freight forwarder (Medical):   Marland Kitchen Lack of Transportation (Non-Medical):   Physical Activity:   . Days of Exercise per Week:   . Minutes of Exercise per Session:   Stress:   . Feeling of Stress :   Social Connections:   . Frequency of Communication with Friends and Family:   . Frequency of Social Gatherings with Friends and Family:   . Attends Religious Services:   . Active Member of Clubs or Organizations:   . Attends Banker Meetings:   Marland Kitchen Marital Status:   Intimate Partner Violence:   . Fear of Current or Ex-Partner:   . Emotionally Abused:   Marland Kitchen Physically Abused:   . Sexually Abused:     Outpatient Medications Prior to Visit  Medication Sig Dispense Refill  . famotidine (PEPCID) 20 MG tablet Take 1 tablet (20 mg total) by mouth 2 (two) times daily. 60 tablet 2  . fluticasone (FLONASE) 50 MCG/ACT nasal spray Place 2 sprays into both nostrils daily. 16 g 6  . levocetirizine (XYZAL) 5 MG tablet Take 1 tablet (5 mg total) by mouth every evening. 30 tablet 0  . cetirizine (ZYRTEC ALLERGY) 10 MG tablet Take  1 tablet (10 mg total) by mouth daily. 90 tablet 0  . cefdinir (OMNICEF) 300 MG capsule Take 1 capsule (300 mg total) by mouth 2 (two) times daily. 20 capsule 0  . predniSONE (DELTASONE) 20 MG tablet 3 tabs poqday 1-2, 2 tabs poqday 3-4, 1 tab poqday 5-6 12 tablet 0  . pseudoephedrine (SUDAFED) 60 MG tablet Take 1 tablet (60 mg total) by mouth every 8 (eight) hours as needed for congestion. 30 tablet 0  . sertraline (ZOLOFT) 50 MG tablet Take 1 tablet (50 mg total) by mouth at bedtime. 30 tablet 3   No facility-administered medications prior to visit.    Allergies  Allergen Reactions  . Latex Rash    Reaction to gloves    Review of Systems  All other systems reviewed and are negative.      Objective:    Physical Exam Vitals and nursing note reviewed.  Constitutional:      Appearance: Normal appearance. She is well-developed and well-groomed.    HENT:     Head: Normocephalic.     Right Ear: Hearing, tympanic membrane, ear canal and external ear normal.     Left Ear: Hearing, tympanic membrane, ear canal and external ear normal.     Nose: Congestion and rhinorrhea present. No mucosal edema.     Right Sinus: Frontal sinus tenderness present. No maxillary sinus tenderness.     Left Sinus: Frontal sinus tenderness present. No maxillary sinus tenderness.     Mouth/Throat:     Mouth: Mucous membranes are moist.     Pharynx: Oropharynx is clear. No oropharyngeal exudate or posterior oropharyngeal erythema.  Eyes:     Extraocular Movements: Extraocular movements intact.     Conjunctiva/sclera: Conjunctivae normal.     Pupils: Pupils are equal, round, and reactive to light.  Neck:     Vascular: No carotid bruit.  Cardiovascular:     Rate and Rhythm: Normal rate.     Pulses: Normal pulses.  Pulmonary:     Effort: Pulmonary effort is normal.     Breath sounds: Normal breath sounds.  Abdominal:     General: Abdomen is flat. Bowel sounds are normal.     Palpations: Abdomen is soft.  Musculoskeletal:        General: Normal range of motion.     Cervical back: Normal range of motion and neck supple. No rigidity or tenderness.     Right lower leg: No edema.     Left lower leg: No edema.  Skin:    General: Skin is warm.     Capillary Refill: Capillary refill takes less than 2 seconds.     Coloration: Skin is not jaundiced.     Findings: No bruising.  Neurological:     General: No focal deficit present.     Mental Status: She is alert and oriented to person, place, and time.  Psychiatric:        Attention and Perception: Attention normal.        Mood and Affect: Mood normal.        Speech: Speech normal.        Behavior: Behavior normal. Behavior is cooperative.        Thought Content: Thought content normal.        Cognition and Memory: Cognition normal.        Judgment: Judgment normal.     BP 118/64 (BP Location: Left Arm,  Patient Position: Sitting, Cuff Size: Large)   Pulse 87  Temp 97.6 F (36.4 C) (Temporal)   Resp 18   Wt (!) 310 lb 12.8 oz (141 kg)   SpO2 98%   BMI 47.26 kg/m  Wt Readings from Last 3 Encounters:  04/29/20 (!) 310 lb 12.8 oz (141 kg)  01/17/20 (!) 311 lb (141.1 kg)  12/26/19 (!) 306 lb (138.8 kg)    Health Maintenance Due  Topic Date Due  . COVID-19 Vaccine (1) Never done    There are no preventive care reminders to display for this patient.   Lab Results  Component Value Date   TSH 1.98 10/03/2019   Lab Results  Component Value Date   WBC 10.2 10/03/2019   HGB 13.7 10/03/2019   HCT 41.4 10/03/2019   MCV 84.3 10/03/2019   PLT 210 10/03/2019   Lab Results  Component Value Date   NA 139 10/03/2019   K 4.1 10/03/2019   CO2 26 10/03/2019   GLUCOSE 100 (H) 10/03/2019   BUN 11 10/03/2019   CREATININE 0.76 10/03/2019   BILITOT 0.5 10/03/2019   ALKPHOS 73 07/18/2016   AST 20 10/03/2019   ALT 54 (H) 10/03/2019   PROT 6.6 10/03/2019   ALBUMIN 3.9 07/18/2016   CALCIUM 9.0 10/03/2019   ANIONGAP 9 12/05/2018   Lab Results  Component Value Date   CHOL 210 (H) 10/03/2019   Lab Results  Component Value Date   HDL 45 (L) 10/03/2019   Lab Results  Component Value Date   LDLCALC 142 (H) 10/03/2019   Lab Results  Component Value Date   TRIG 114 10/03/2019   Lab Results  Component Value Date   CHOLHDL 4.7 10/03/2019   Lab Results  Component Value Date   HGBA1C 5.2 11/19/2015       Assessment & Plan:   Problem List Items Addressed This Visit    None    Visit Diagnoses    Acute non-recurrent frontal sinusitis    -  Primary   Relevant Medications   azithromycin (ZITHROMAX) 250 MG tablet    Your symptoms are consistent with frontal sinusitis, get plenty of rest, drink plenty of water, use your Flonase 2 sprays each nostril once a day for 5 days and continue 1 spray to each nostril once a day for the rest of the spring, continue taking your daily  antihistamine, start and complete antibiotic.   Meds ordered this encounter  Medications  . azithromycin (ZITHROMAX) 250 MG tablet    Sig: Take 2 tablets day one; take 1 tab day tow through five.    Dispense:  6 tablet    Refill:  0    Follow-up if symptoms worsen or fail to improve  Annie Main, FNP

## 2020-05-07 ENCOUNTER — Telehealth: Payer: Self-pay | Admitting: Family Medicine

## 2020-05-07 ENCOUNTER — Other Ambulatory Visit: Payer: Self-pay | Admitting: Nurse Practitioner

## 2020-05-07 DIAGNOSIS — B3731 Acute candidiasis of vulva and vagina: Secondary | ICD-10-CM

## 2020-05-07 MED ORDER — FLUCONAZOLE 150 MG PO TABS: 150.0000 mg | ORAL_TABLET | Freq: Once | ORAL | 0 refills | Status: AC

## 2020-05-07 NOTE — Telephone Encounter (Signed)
See note

## 2020-05-07 NOTE — Telephone Encounter (Signed)
Pt suspects yeast infection antibiotic,  itchy and burning, is going out of town tomorrow and doesn't wont to travel feeling uncomfortable.

## 2020-05-07 NOTE — Telephone Encounter (Signed)
CB# [phone] Was seen by NP on 04-29-20 was prescribe antibiotic was told to call back if she has a yeast infection after taking the antibiotic  Crystal would prescribe rx  Pharmacy [facility] [place]

## 2020-07-02 DIAGNOSIS — F3132 Bipolar disorder, current episode depressed, moderate: Secondary | ICD-10-CM | POA: Diagnosis not present

## 2020-07-02 DIAGNOSIS — F411 Generalized anxiety disorder: Secondary | ICD-10-CM | POA: Diagnosis not present

## 2020-07-20 ENCOUNTER — Telehealth: Payer: Self-pay | Admitting: Family Medicine

## 2020-07-20 NOTE — Telephone Encounter (Signed)
Patient left vm asking for her Pepcid to be increased from 20 mg and or a different medication she has been having a lot of acid reflux the past week.   CB# [phone]

## 2020-07-21 ENCOUNTER — Other Ambulatory Visit: Payer: Self-pay | Admitting: Family Medicine

## 2020-07-21 MED ORDER — OMEPRAZOLE 40 MG PO CPDR: 40.0000 mg | DELAYED_RELEASE_CAPSULE | Freq: Every day | ORAL | 3 refills | Status: DC

## 2020-07-21 NOTE — Telephone Encounter (Signed)
Use Prilosec 40 mg a day.  Continue Pepcid at his current dose

## 2020-07-21 NOTE — Telephone Encounter (Signed)
MD please advise

## 2020-07-21 NOTE — Telephone Encounter (Signed)
Call placed to patient. No answer, line busy.

## 2020-07-23 NOTE — Telephone Encounter (Signed)
Multiple calls placed to patient with no answer and no return call.   Message to be closed.  

## 2020-09-25 DIAGNOSIS — F411 Generalized anxiety disorder: Secondary | ICD-10-CM | POA: Diagnosis not present

## 2020-09-25 DIAGNOSIS — F3132 Bipolar disorder, current episode depressed, moderate: Secondary | ICD-10-CM | POA: Diagnosis not present

## 2020-12-15 DIAGNOSIS — F411 Generalized anxiety disorder: Secondary | ICD-10-CM | POA: Diagnosis not present

## 2020-12-15 DIAGNOSIS — F3132 Bipolar disorder, current episode depressed, moderate: Secondary | ICD-10-CM | POA: Diagnosis not present

## 2021-02-15 DIAGNOSIS — F3132 Bipolar disorder, current episode depressed, moderate: Secondary | ICD-10-CM | POA: Diagnosis not present

## 2021-02-15 DIAGNOSIS — F411 Generalized anxiety disorder: Secondary | ICD-10-CM | POA: Diagnosis not present

## 2021-04-05 ENCOUNTER — Other Ambulatory Visit: Payer: Self-pay | Admitting: Family Medicine

## 2021-04-05 DIAGNOSIS — Z1231 Encounter for screening mammogram for malignant neoplasm of breast: Secondary | ICD-10-CM

## 2021-04-07 ENCOUNTER — Ambulatory Visit: Payer: Medicaid Other

## 2021-04-12 ENCOUNTER — Other Ambulatory Visit: Payer: Medicaid Other

## 2021-04-15 ENCOUNTER — Encounter: Payer: Medicaid Other | Admitting: Family Medicine

## 2021-04-23 ENCOUNTER — Other Ambulatory Visit: Payer: Self-pay

## 2021-04-23 ENCOUNTER — Other Ambulatory Visit: Payer: Medicaid Other

## 2021-04-23 DIAGNOSIS — Z136 Encounter for screening for cardiovascular disorders: Secondary | ICD-10-CM | POA: Diagnosis not present

## 2021-04-23 DIAGNOSIS — Z0000 Encounter for general adult medical examination without abnormal findings: Secondary | ICD-10-CM | POA: Diagnosis not present

## 2021-04-23 DIAGNOSIS — Z13220 Encounter for screening for lipoid disorders: Secondary | ICD-10-CM

## 2021-04-24 LAB — LIPID PANEL
Cholesterol: 208 mg/dL — ABNORMAL HIGH (ref <200)
HDL: 39 mg/dL — ABNORMAL LOW (ref >=50)
LDL Cholesterol (Calc): 142 mg/dL (calc) — ABNORMAL HIGH
Non-HDL Cholesterol (Calc): 169 mg/dL (calc) — ABNORMAL HIGH (ref <130)
Total CHOL/HDL Ratio: 5.3 (calc) — ABNORMAL HIGH (ref <5.0)
Triglycerides: 148 mg/dL (ref <150)

## 2021-04-24 LAB — COMPLETE METABOLIC PANEL WITH GFR
AG Ratio: 1.5 (calc) (ref 1.0–2.5)
ALT: 36 U/L — ABNORMAL HIGH (ref 6–29)
AST: 28 U/L (ref 10–30)
Albumin: 4 g/dL (ref 3.6–5.1)
Alkaline phosphatase (APISO): 83 U/L (ref 31–125)
BUN: 7 mg/dL (ref 7–25)
CO2: 24 mmol/L (ref 20–32)
Calcium: 9.2 mg/dL (ref 8.6–10.2)
Chloride: 104 mmol/L (ref 98–110)
Creat: 0.68 mg/dL (ref 0.50–1.10)
GFR, Est African American: 125 mL/min/{1.73_m2} (ref >=60)
GFR, Est Non African American: 108 mL/min/{1.73_m2} (ref >=60)
Globulin: 2.6 g/dL (calc) (ref 1.9–3.7)
Glucose, Bld: 97 mg/dL (ref 65–99)
Potassium: 3.6 mmol/L (ref 3.5–5.3)
Sodium: 141 mmol/L (ref 135–146)
Total Bilirubin: 0.4 mg/dL (ref 0.2–1.2)
Total Protein: 6.6 g/dL (ref 6.1–8.1)

## 2021-04-24 LAB — CBC WITH DIFFERENTIAL/PLATELET
Absolute Monocytes: 460 cells/uL (ref 200–950)
Basophils Absolute: 37 cells/uL (ref 0–200)
Basophils Relative: 0.4 %
Eosinophils Absolute: 304 cells/uL (ref 15–500)
Eosinophils Relative: 3.3 %
HCT: 43.4 % (ref 35.0–45.0)
Hemoglobin: 13.8 g/dL (ref 11.7–15.5)
Lymphs Abs: 2484 cells/uL (ref 850–3900)
MCH: 27.3 pg (ref 27.0–33.0)
MCHC: 31.8 g/dL — ABNORMAL LOW (ref 32.0–36.0)
MCV: 85.8 fL (ref 80.0–100.0)
MPV: 10.8 fL (ref 7.5–12.5)
Monocytes Relative: 5 %
Neutro Abs: 5916 cells/uL (ref 1500–7800)
Neutrophils Relative %: 64.3 %
Platelets: 212 10*3/uL (ref 140–400)
RBC: 5.06 10*6/uL (ref 3.80–5.10)
RDW: 12.4 % (ref 11.0–15.0)
Total Lymphocyte: 27 %
WBC: 9.2 10*3/uL (ref 3.8–10.8)

## 2021-04-30 ENCOUNTER — Encounter: Payer: Self-pay | Admitting: Family Medicine

## 2021-04-30 ENCOUNTER — Other Ambulatory Visit: Payer: Self-pay

## 2021-04-30 ENCOUNTER — Ambulatory Visit (INDEPENDENT_AMBULATORY_CARE_PROVIDER_SITE_OTHER): Payer: Medicaid Other | Admitting: Family Medicine

## 2021-04-30 VITALS — BP 112/64 | HR 82 | Temp 98.0°F | Resp 16 | Ht 68.0 in | Wt 308.0 lb

## 2021-04-30 DIAGNOSIS — Z1231 Encounter for screening mammogram for malignant neoplasm of breast: Secondary | ICD-10-CM

## 2021-04-30 DIAGNOSIS — Z0001 Encounter for general adult medical examination with abnormal findings: Secondary | ICD-10-CM

## 2021-04-30 DIAGNOSIS — Z124 Encounter for screening for malignant neoplasm of cervix: Secondary | ICD-10-CM

## 2021-04-30 DIAGNOSIS — Z0000 Encounter for general adult medical examination without abnormal findings: Secondary | ICD-10-CM

## 2021-04-30 DIAGNOSIS — E7800 Pure hypercholesterolemia, unspecified: Secondary | ICD-10-CM

## 2021-04-30 MED ORDER — SERTRALINE HCL 50 MG PO TABS: 1.0000 | ORAL_TABLET | Freq: Every day | ORAL | 1 refills | Status: DC

## 2021-04-30 MED ORDER — ATORVASTATIN CALCIUM 20 MG PO TABS: 20.0000 mg | ORAL_TABLET | Freq: Every day | ORAL | 3 refills | Status: DC

## 2021-04-30 NOTE — Progress Notes (Signed)
Subjective:    Patient ID: [name], female    DOB: [birth year], 40 y.o.   MRN: [id number]  HPI Patient is a 40 year old Caucasian female here today for complete physical exam.  She denies any specific concerns today.  She requests a refill on her Zoloft.  This was originally prescribed by psychiatrist and seems to be working well for her regarding her depression.  She is trying to establish care with a new psychiatrist and she is about to run out of the Zoloft.  I will be glad to give her a temporary supply until she can reestablish.  She is also due for mammogram and a Pap smear.  Her most recent lab work is listed below and show significant hyperlipidemia and I believe elevated liver function test secondary to fatty liver disease  Lab on 04/23/2021  Component Date Value Ref Range Status  . WBC 04/23/2021 9.2  3.8 - 10.8 Thousand/uL Final  . RBC 04/23/2021 5.06  3.80 - 5.10 Million/uL Final  . Hemoglobin 04/23/2021 13.8  11.7 - 15.5 g/dL Final  . HCT 49/70/2637 43.4  35.0 - 45.0 % Final  . MCV 04/23/2021 85.8  80.0 - 100.0 fL Final  . MCH 04/23/2021 27.3  27.0 - 33.0 pg Final  . MCHC 04/23/2021 31.8* 32.0 - 36.0 g/dL Final  . RDW 85/88/5027 12.4  11.0 - 15.0 % Final  . Platelets 04/23/2021 212  140 - 400 Thousand/uL Final  . MPV 04/23/2021 10.8  7.5 - 12.5 fL Final  . Neutro Abs 04/23/2021 5,916  1,500 - 7,800 cells/uL Final  . Lymphs Abs 04/23/2021 2,484  850 - 3,900 cells/uL Final  . Absolute Monocytes 04/23/2021 460  200 - 950 cells/uL Final  . Eosinophils Absolute 04/23/2021 304  15 - 500 cells/uL Final  . Basophils Absolute 04/23/2021 37  0 - 200 cells/uL Final  . Neutrophils Relative % 04/23/2021 64.3  % Final  . Total Lymphocyte 04/23/2021 27.0  % Final  . Monocytes Relative 04/23/2021 5.0  % Final  . Eosinophils Relative 04/23/2021 3.3  % Final  . Basophils Relative 04/23/2021 0.4  % Final  . Glucose, Bld 04/23/2021 97  65 - 99 mg/dL Final   Comment: .            Fasting  reference interval .   . BUN 04/23/2021 7  7 - 25 mg/dL Final  . Creat 74/10/8785 0.68  0.50 - 1.10 mg/dL Final  . GFR, Est Non African American 04/23/2021 108  > OR = 60 mL/min/1.79m2 Final  . GFR, Est African American 04/23/2021 125  > OR = 60 mL/min/1.56m2 Final  . BUN/Creatinine Ratio 04/23/2021 NOT APPLICABLE  6 - 22 (calc) Final  . Sodium 04/23/2021 141  135 - 146 mmol/L Final  . Potassium 04/23/2021 3.6  3.5 - 5.3 mmol/L Final  . Chloride 04/23/2021 104  98 - 110 mmol/L Final  . CO2 04/23/2021 24  20 - 32 mmol/L Final  . Calcium 04/23/2021 9.2  8.6 - 10.2 mg/dL Final  . Total Protein 04/23/2021 6.6  6.1 - 8.1 g/dL Final  . Albumin 76/72/0947 4.0  3.6 - 5.1 g/dL Final  . Globulin 09/62/8366 2.6  1.9 - 3.7 g/dL (calc) Final  . AG Ratio 04/23/2021 1.5  1.0 - 2.5 (calc) Final  . Total Bilirubin 04/23/2021 0.4  0.2 - 1.2 mg/dL Final  . Alkaline phosphatase (APISO) 04/23/2021 83  31 - 125 U/L Final  . AST 04/23/2021 28  10 -  30 U/L Final  . ALT 04/23/2021 36* 6 - 29 U/L Final  . Cholesterol 04/23/2021 208* <200 mg/dL Final  . HDL 93/26/7124 39* > OR = 50 mg/dL Final  . Triglycerides 04/23/2021 148  <150 mg/dL Final  . LDL Cholesterol (Calc) 04/23/2021 142* mg/dL (calc) Final   Comment: Reference range: <100 . Desirable range <100 mg/dL for primary prevention;   <70 mg/dL for patients with CHD or diabetic patients  with > or = 2 CHD risk factors. Marland Kitchen LDL-C is now calculated using the Martin-Hopkins  calculation, which is a validated novel method providing  better accuracy than the Friedewald equation in the  estimation of LDL-C.  Horald Pollen et al. Lenox Ahr. 5809;983(38): 2061-2068  (http://education.[facility].com/faq/FAQ164)   . Total CHOL/HDL Ratio 04/23/2021 5.3* <2.5 (calc) Final  . Non-HDL Cholesterol (Calc) 04/23/2021 169* <130 mg/dL (calc) Final   Comment: For patients with diabetes plus 1 major ASCVD risk  factor, treating to a non-HDL-C goal of <100 mg/dL  (LDL-C of  <05 mg/dL) is considered a therapeutic  option.     Past Medical History:  Diagnosis Date  . Anxiety   . Asthma   . Depression   . Ectopic pregnancy   . Irritable bowel syndrome (IBS) .  Marland Kitchen PTSD (post-traumatic stress disorder)    Past Surgical History:  Procedure Laterality Date  . TONSILLECTOMY    . TUBAL LIGATION     Current Outpatient Medications on File Prior to Visit  Medication Sig Dispense Refill  . azithromycin (ZITHROMAX) 250 MG tablet Take 2 tablets day one; take 1 tab day tow through five. 6 tablet 0  . famotidine (PEPCID) 20 MG tablet Take 1 tablet (20 mg total) by mouth 2 (two) times daily. 60 tablet 2  . fluticasone (FLONASE) 50 MCG/ACT nasal spray Place 2 sprays into both nostrils daily. 16 g 6  . levocetirizine (XYZAL) 5 MG tablet Take 1 tablet (5 mg total) by mouth every evening. 30 tablet 3  . omeprazole (PRILOSEC) 40 MG capsule Take 1 capsule (40 mg total) by mouth daily. 30 capsule 3  . [DISCONTINUED] atorvastatin (LIPITOR) 20 MG tablet Take 1 tablet (20 mg total) by mouth daily. (Patient not taking: Reported on 12/26/2019) 90 tablet 1   No current facility-administered medications on file prior to visit.   Allergies  Allergen Reactions  . Latex Rash    Reaction to gloves   Social History   Socioeconomic History  . Marital status: Single    Spouse name: Not on file  . Number of children: Not on file  . Years of education: Not on file  . Highest education level: Not on file  Occupational History  . Not on file  Tobacco Use  . Smoking status: Former Smoker    Packs/day: 0.50    Types: Cigarettes  . Smokeless tobacco: Never Used  Vaping Use  . Vaping Use: Never used  Substance and Sexual Activity  . Alcohol use: Yes    Comment: social  . Drug use: Not Currently    Types: Marijuana  . Sexual activity: Yes    Birth control/protection: None  Other Topics Concern  . Not on file  Social History Narrative  . Not on file   Social Determinants  of Health   Financial Resource Strain: Not on file  Food Insecurity: Not on file  Transportation Needs: Not on file  Physical Activity: Not on file  Stress: Not on file  Social Connections: Not on file  Intimate Partner  Violence: Not on file   No family history on file.    Review of Systems  All other systems reviewed and are negative.      Objective:   Physical Exam Vitals reviewed.  Constitutional:      General: She is not in acute distress.    Appearance: She is obese. She is not ill-appearing, toxic-appearing or diaphoretic.  HENT:     Head: Normocephalic and atraumatic.     Right Ear: Tympanic membrane, ear canal and external ear normal. There is no impacted cerumen.     Left Ear: Tympanic membrane, ear canal and external ear normal. There is no impacted cerumen.     Nose: Nose normal. No congestion or rhinorrhea.     Mouth/Throat:     Mouth: Mucous membranes are moist.     Pharynx: Oropharynx is clear. No oropharyngeal exudate or posterior oropharyngeal erythema.  Eyes:     General: No scleral icterus.       Right eye: No discharge.        Left eye: No discharge.     Extraocular Movements: Extraocular movements intact.     Conjunctiva/sclera: Conjunctivae normal.     Pupils: Pupils are equal, round, and reactive to light.  Neck:     Vascular: No carotid bruit.  Cardiovascular:     Rate and Rhythm: Normal rate and regular rhythm.     Pulses: Normal pulses.     Heart sounds: Normal heart sounds. No murmur heard. No friction rub. No gallop.   Pulmonary:     Effort: Pulmonary effort is normal. No respiratory distress.     Breath sounds: Normal breath sounds. No stridor. No wheezing, rhonchi or rales.  Chest:     Chest wall: No tenderness.  Abdominal:     General: Abdomen is flat. Bowel sounds are normal. There is no distension.     Palpations: Abdomen is soft. There is no mass.     Tenderness: There is no abdominal tenderness. There is no guarding or rebound.      Hernia: No hernia is present.  Genitourinary:    Cervix: Normal.     Adnexa: Right adnexa normal and left adnexa normal.  Musculoskeletal:        General: Normal range of motion.     Cervical back: Normal range of motion and neck supple. No muscular tenderness.     Right lower leg: No edema.     Left lower leg: No edema.  Lymphadenopathy:     Cervical: No cervical adenopathy.  Skin:    General: Skin is warm.     Capillary Refill: Capillary refill takes less than 2 seconds.     Coloration: Skin is not jaundiced or pale.     Findings: No bruising, erythema, lesion or rash.  Neurological:     General: No focal deficit present.     Mental Status: She is alert and oriented to person, place, and time. Mental status is at baseline.     Cranial Nerves: No cranial nerve deficit.     Sensory: No sensory deficit.     Motor: No weakness.     Coordination: Coordination normal.     Gait: Gait normal.     Deep Tendon Reflexes: Reflexes normal.  Psychiatric:        Mood and Affect: Mood normal.        Behavior: Behavior normal.        Thought Content: Thought content normal.  Judgment: Judgment normal.           Assessment & Plan:  General medical exam  Pure hypercholesterolemia  Encounter for screening mammogram for malignant neoplasm of breast - Plan: MM Digital Screening  Cervical cancer screening - Plan: PAP, Thin Prep w/HPV rflx HPV Type 16/18  I will schedule the patient for mammogram.  Pap smear was performed today and was sent to pathology in a labeled container.  Recommended starting Lipitor 20 mg a day and rechecking a fasting lipid panel and a CMP in 3 months.  Recommended a low carbohydrate diet.  Decrease consumption of bread and also decrease consumption of sodas.  I believe this would facilitate weight loss and help improve her underlying fatty liver disease.  I refilled her Zoloft as requested.  Blood pressure today is excellent.  The remainder of her lab work  was normal

## 2021-05-04 LAB — PAP, TP IMAGING W/ HPV RNA, RFLX HPV TYPE 16,18/45: HPV DNA High Risk: NOT DETECTED

## 2021-07-20 ENCOUNTER — Ambulatory Visit: Payer: Medicaid Other

## 2021-07-26 ENCOUNTER — Telehealth: Payer: Self-pay | Admitting: Family Medicine

## 2021-07-26 MED ORDER — NIRMATRELVIR/RITONAVIR (PAXLOVID)TABLET: 3.0000 | ORAL_TABLET | Freq: Two times a day (BID) | ORAL | 0 refills | Status: AC

## 2021-07-26 NOTE — Telephone Encounter (Signed)
Patient called to report positive at-home COVID test taken 8/28; wants advice for managing symptoms which are as follows: fever, headache,chills, cough, congestion, runny nose, nausea (vomited once), loose stool. Symptoms off and on since 8/25.   Also requesting refill for   fluticasone (FLONASE) 50 MCG/ACT nasal spray [[id number]]  levocetirizine (XYZAL) 5 MG tablet [[id number]]   Pharmacy verified as   [id number] - [place], [place] - [address] AT [facility] [address]  [address], [place] [address]  Phone:  [phone]  Fax:  [phone]  DEA #:  [id number]  Please advise at [phone]

## 2021-07-26 NOTE — Telephone Encounter (Signed)
Call placed to patient. No answer. No VM.   Paxlovid sent to pharmacy.

## 2021-07-28 NOTE — Telephone Encounter (Signed)
Multiple calls placed to patient with no answer and no return call.   Message to be closed.  

## 2021-07-29 ENCOUNTER — Telehealth: Payer: Self-pay

## 2021-07-29 DIAGNOSIS — J0110 Acute frontal sinusitis, unspecified: Secondary | ICD-10-CM

## 2021-07-29 MED ORDER — LEVOCETIRIZINE DIHYDROCHLORIDE 5 MG PO TABS: 5.0000 mg | ORAL_TABLET | Freq: Every evening | ORAL | 3 refills | Status: DC

## 2021-07-29 MED ORDER — FLUTICASONE PROPIONATE 50 MCG/ACT NA SUSP: 2.0000 | Freq: Every day | NASAL | 6 refills | Status: DC

## 2021-07-29 NOTE — Telephone Encounter (Signed)
Pt called in requesting a refill of levocetirizine (XYZAL) 5 MG tablet  and fluticasone (FLONASE) 50 MCG/ACT be  sent to pharmacy.  Cb #: [phone]

## 2021-07-29 NOTE — Telephone Encounter (Signed)
Prescription sent to pharmacy.

## 2021-08-06 ENCOUNTER — Ambulatory Visit: Payer: Medicaid Other | Admitting: Family Medicine

## 2021-09-24 ENCOUNTER — Other Ambulatory Visit: Payer: Self-pay | Admitting: Family Medicine

## 2021-09-24 NOTE — Telephone Encounter (Signed)
Pt called in requesting a refill of sertraline (ZOLOFT) 50 MG tablet

## 2021-09-28 DIAGNOSIS — Z419 Encounter for procedure for purposes other than remedying health state, unspecified: Secondary | ICD-10-CM | POA: Diagnosis not present

## 2021-10-15 ENCOUNTER — Encounter (HOSPITAL_COMMUNITY): Payer: Self-pay | Admitting: Physician Assistant

## 2021-10-15 ENCOUNTER — Other Ambulatory Visit: Payer: Self-pay

## 2021-10-15 ENCOUNTER — Ambulatory Visit (HOSPITAL_COMMUNITY)
Admission: EM | Admit: 2021-10-15 | Discharge: 2021-10-15 | Disposition: A | Discharge status: Home / Self Care | Payer: Medicaid Other | Attending: Physician Assistant | Admitting: Physician Assistant

## 2021-10-15 DIAGNOSIS — K0889 Other specified disorders of teeth and supporting structures: Secondary | ICD-10-CM | POA: Diagnosis not present

## 2021-10-15 DIAGNOSIS — K047 Periapical abscess without sinus: Secondary | ICD-10-CM

## 2021-10-15 MED ORDER — CLINDAMYCIN HCL 300 MG PO CAPS: 300.0000 mg | ORAL_CAPSULE | Freq: Three times a day (TID) | ORAL | 0 refills | Status: DC

## 2021-10-15 MED ORDER — IBUPROFEN 800 MG PO TABS: 800.0000 mg | ORAL_TABLET | Freq: Three times a day (TID) | ORAL | 0 refills | Status: DC

## 2021-10-15 NOTE — ED Triage Notes (Signed)
Pt presents with left side dental pain since last night.

## 2021-10-15 NOTE — ED Provider Notes (Signed)
[facility]    CSN: [id number] Arrival date & time: 10/15/21  1618      History   Chief Complaint Chief Complaint  Patient presents with   Dental Pain    HPI [name] is a 40 y.o. female.   Patient presents today with a 2-day history of worsening swelling of her left upper molar gums with associated pain.  Pain is rated 8 on a 0-10 pain scale, localized to broken teeth on upper left side, described as aching, worse with attempted mastication, no alleviating factors identified.  She has tried Tylenol PM with temporary improvement of symptoms.  She has several known broken teeth as well as decay on other teeth but has not seen a dentist recently.  She did contact her dentist but cannot be seen until after the new year.  Denies any recent antibiotic use but does report she has taken amoxicillin which has caused scratchy throat and so she is requesting this be added to her allergies.  She denies any recent dental procedure.  She is confident she is not pregnant.  She is able to eat and drink despite symptoms.  Denies any difficulty speaking, difficulty swallowing, swelling of her throat/mouth, voice changes.   Past Medical History:  Diagnosis Date   Anxiety    Asthma    Depression    Ectopic pregnancy    Irritable bowel syndrome (IBS) .   PTSD (post-traumatic stress disorder)     Patient Active Problem List   Diagnosis Date Noted   GERD (gastroesophageal reflux disease) 06/08/2017   MDD (major depressive disorder), recurrent episode, severe (HCC) 11/16/2015   Marijuana abuse     Past Surgical History:  Procedure Laterality Date   TONSILLECTOMY     TUBAL LIGATION      OB History   No obstetric history on file.      Home Medications    Prior to Admission medications   Medication Sig Start Date End Date Taking? Authorizing Provider  clindamycin (CLEOCIN) 300 MG capsule Take 1 capsule (300 mg total) by mouth 3 (three) times daily. 10/15/21  Yes Nyriah Coote,  Quintell Bonnin K, PA-C  ibuprofen (ADVIL) 800 MG tablet Take 1 tablet (800 mg total) by mouth 3 (three) times daily. 10/15/21  Yes Stevon Gough K, PA-C  atorvastatin (LIPITOR) 20 MG tablet Take 1 tablet (20 mg total) by mouth daily. 04/30/21   Donita Brooks, MD  fluticasone (FLONASE) 50 MCG/ACT nasal spray Place 2 sprays into both nostrils daily. 07/29/21   Donita Brooks, MD  levocetirizine (XYZAL) 5 MG tablet Take 1 tablet (5 mg total) by mouth every evening. 07/29/21   Donita Brooks, MD  omeprazole (PRILOSEC) 40 MG capsule Take 1 capsule (40 mg total) by mouth daily. 07/21/20   Donita Brooks, MD  sertraline (ZOLOFT) 50 MG tablet TAKE 1 TABLET BY MOUTH EVERY DAY 09/24/21   Donita Brooks, MD    Family History History reviewed. No pertinent family history.  Social History Social History   Tobacco Use   Smoking status: Former    Packs/day: 0.50    Types: Cigarettes   Smokeless tobacco: Never  Vaping Use   Vaping Use: Never used  Substance Use Topics   Alcohol use: Yes    Comment: social   Drug use: Not Currently    Types: Marijuana     Allergies   Amoxicillin and Latex   Review of Systems Review of Systems  Constitutional:  Positive for activity  change. Negative for appetite change, fatigue and fever.  HENT:  Positive for dental problem. Negative for congestion, sinus pressure, sneezing and sore throat.   Respiratory:  Negative for cough and shortness of breath.   Cardiovascular:  Negative for chest pain.  Gastrointestinal:  Negative for abdominal pain, diarrhea, nausea and vomiting.  Neurological:  Negative for dizziness, light-headedness and headaches.    Physical Exam Triage Vital Signs ED Triage Vitals  Enc Vitals Group     BP 10/15/21 1724 (!) 140/92     Pulse Rate 10/15/21 1724 74     Resp 10/15/21 1724 18     Temp 10/15/21 1724 98.3 F (36.8 C)     Temp Source 10/15/21 1724 Oral     SpO2 10/15/21 1724 100 %     Weight --      Height --      Head  Circumference --      Peak Flow --      Pain Score 10/15/21 1727 9     Pain Loc --      Pain Edu? --      Excl. in GC? --    No data found.  Updated Vital Signs BP (!) 140/92 (BP Location: Left Arm)   Pulse 74   Temp 98.3 F (36.8 C) (Oral)   Resp 18   SpO2 100%   Visual Acuity Right Eye Distance:   Left Eye Distance:   Bilateral Distance:    Right Eye Near:   Left Eye Near:    Bilateral Near:     Physical Exam Vitals reviewed.  Constitutional:      General: She is awake. She is not in acute distress.    Appearance: Normal appearance. She is well-developed. She is not ill-appearing.     Comments: Very pleasant female appears stated age no acute distress sitting comfortably in exam room  HENT:     Head: Normocephalic and atraumatic.     Right Ear: Tympanic membrane, ear canal and external ear normal. Tympanic membrane is not erythematous or bulging.     Left Ear: Tympanic membrane, ear canal and external ear normal. Tympanic membrane is not erythematous or bulging.     Nose:     Right Sinus: No maxillary sinus tenderness or frontal sinus tenderness.     Left Sinus: No maxillary sinus tenderness or frontal sinus tenderness.     Mouth/Throat:     Dentition: Abnormal dentition. Dental tenderness, gingival swelling, dental caries and dental abscesses present.     Pharynx: Uvula midline. No oropharyngeal exudate or posterior oropharyngeal erythema.      Comments: No evidence of Ludwig angina Cardiovascular:     Rate and Rhythm: Normal rate and regular rhythm.     Heart sounds: Normal heart sounds, S1 normal and S2 normal. No murmur heard. Pulmonary:     Effort: Pulmonary effort is normal.     Breath sounds: Normal breath sounds. No wheezing, rhonchi or rales.     Comments: Clear auscultation bilaterally Psychiatric:        Behavior: Behavior is cooperative.     UC Treatments / Results  Labs (all labs ordered are listed, but only abnormal results are  displayed) Labs Reviewed - No data to display  EKG   Radiology No results found.  Procedures Procedures (including critical care time)  Medications Ordered in UC Medications - No data to display  Initial Impression / Assessment and Plan / UC Course  I have reviewed the triage  vital signs and the nursing notes.  Pertinent labs & imaging results that were available during my care of the patient were reviewed by me and considered in my medical decision making (see chart for details).     Will cover for dental infection.  She was started on clindamycin 3 times a day given possible reaction to amoxicillin.  She was prescribed ibuprofen to be taken up to 3 times a day as needed for pain.  Discussed that she should not need additional NSAIDs with this medication due to risk of GI bleeding.  Can use cool compresses on affected area.  Recommend she follow-up with dentist as soon as possible.  She was given contact information for local provider.  Discussed alarm symptoms that warrant emergent evaluation.  Strict return precautions given to which she expressed understanding.  Final Clinical Impressions(s) / UC Diagnoses   Final diagnoses:  Pain, dental  Dental infection  Dental abscess     Discharge Instructions      Start clindamycin 3 times a day.  Take ibuprofen 3 times a day as needed.  Do not take additional NSAIDs including aspirin, ibuprofen/Advil, naproxen/Aleve as it can cause stomach bleeding.  You can use Tylenol for breakthrough pain.  Please follow-up with a dentist as soon as possible as you will likely have recurrent symptoms until underlying tooth issue is addressed.  If you have any difficulty speaking, difficulty swallowing, swelling of your throat/mouth you need to go to the emergency room as we discussed.     ED Prescriptions     Medication Sig Dispense Auth. Provider   ibuprofen (ADVIL) 800 MG tablet Take 1 tablet (800 mg total) by mouth 3 (three) times daily.  21 tablet Mikayla Chiusano K, PA-C   clindamycin (CLEOCIN) 300 MG capsule Take 1 capsule (300 mg total) by mouth 3 (three) times daily. 30 capsule Haden Suder K, PA-C      PDMP not reviewed this encounter.   Jeani Hawking, PA-C 10/15/21 1743

## 2021-10-15 NOTE — Discharge Instructions (Signed)
Start clindamycin 3 times a day.  Take ibuprofen 3 times a day as needed.  Do not take additional NSAIDs including aspirin, ibuprofen/Advil, naproxen/Aleve as it can cause stomach bleeding.  You can use Tylenol for breakthrough pain.  Please follow-up with a dentist as soon as possible as you will likely have recurrent symptoms until underlying tooth issue is addressed.  If you have any difficulty speaking, difficulty swallowing, swelling of your throat/mouth you need to go to the emergency room as we discussed.

## 2021-10-28 DIAGNOSIS — Z419 Encounter for procedure for purposes other than remedying health state, unspecified: Secondary | ICD-10-CM | POA: Diagnosis not present

## 2021-11-05 ENCOUNTER — Other Ambulatory Visit: Payer: Self-pay | Admitting: Family Medicine

## 2021-11-28 DIAGNOSIS — Z419 Encounter for procedure for purposes other than remedying health state, unspecified: Secondary | ICD-10-CM | POA: Diagnosis not present

## 2021-12-24 DIAGNOSIS — Z20822 Contact with and (suspected) exposure to covid-19: Secondary | ICD-10-CM | POA: Diagnosis not present

## 2021-12-29 DIAGNOSIS — Z419 Encounter for procedure for purposes other than remedying health state, unspecified: Secondary | ICD-10-CM | POA: Diagnosis not present

## 2021-12-31 ENCOUNTER — Other Ambulatory Visit: Payer: Self-pay | Admitting: Family Medicine

## 2022-01-13 DIAGNOSIS — Z20822 Contact with and (suspected) exposure to covid-19: Secondary | ICD-10-CM | POA: Diagnosis not present

## 2022-01-22 DIAGNOSIS — Z20822 Contact with and (suspected) exposure to covid-19: Secondary | ICD-10-CM | POA: Diagnosis not present

## 2022-01-26 DIAGNOSIS — Z419 Encounter for procedure for purposes other than remedying health state, unspecified: Secondary | ICD-10-CM | POA: Diagnosis not present

## 2022-02-07 DIAGNOSIS — Z20822 Contact with and (suspected) exposure to covid-19: Secondary | ICD-10-CM | POA: Diagnosis not present

## 2022-02-14 ENCOUNTER — Ambulatory Visit (INDEPENDENT_AMBULATORY_CARE_PROVIDER_SITE_OTHER): Payer: Medicaid Other

## 2022-02-14 ENCOUNTER — Other Ambulatory Visit: Payer: Self-pay

## 2022-02-14 ENCOUNTER — Encounter (HOSPITAL_COMMUNITY): Payer: Self-pay

## 2022-02-14 ENCOUNTER — Ambulatory Visit (HOSPITAL_COMMUNITY)
Admission: RE | Admit: 2022-02-14 | Discharge: 2022-02-14 | Disposition: A | Discharge status: Home / Self Care | Payer: Medicaid Other | Source: Ambulatory Visit | Attending: Physician Assistant | Admitting: Physician Assistant

## 2022-02-14 VITALS — BP 112/83 | HR 94 | Temp 98.1°F | Resp 22

## 2022-02-14 DIAGNOSIS — M546 Pain in thoracic spine: Secondary | ICD-10-CM | POA: Diagnosis not present

## 2022-02-14 DIAGNOSIS — R0789 Other chest pain: Secondary | ICD-10-CM

## 2022-02-14 DIAGNOSIS — M940 Chondrocostal junction syndrome [Tietze]: Secondary | ICD-10-CM

## 2022-02-14 MED ORDER — PREDNISONE 10 MG (21) PO TBPK: ORAL_TABLET | ORAL | 0 refills | Status: DC

## 2022-02-14 NOTE — Discharge Instructions (Signed)
Your chest x-ray was normal which is great news.  I believe that you have costochondritis or inflammation of your ribs.  Start prednisone taper as prescribed.  Do not take NSAIDs including aspirin, ibuprofen/Advil, naproxen/Aleve with this medication as it can cause stomach bleeding.  If you have any worsening symptoms including shortness of breath, worsening pain, nausea/vomiting, fever you need to be seen immediately.  If symptoms do not improve by next week return here or see your PCP.  Avoid smoke and other lung irritants. ?

## 2022-02-14 NOTE — ED Triage Notes (Signed)
Saturday morning, chest heavy.  Went out for the evening.  This morning, turned torso and now has pain in back.  Pain in mid-back  radiating to both sides of torso, left more than right .  Patient has been sneezing and coughing.   ?

## 2022-02-14 NOTE — ED Provider Notes (Signed)
[facility]    CSN: [id number] Arrival date & time: 02/14/22  1452      History   Chief Complaint Chief Complaint  Patient presents with   Appointment    3:00 pm   Back Pain    HPI [name] is a 41 y.o. female.   Patient presents today with a 3-day history of chest wall pain that is spreading to her thoracic back.  She reports pain is rated 8 on a 0-10 pain scale, described as a sharp, worse with certain movements or palpation, no alleviating factors identified.  She denies any shortness of breath but has not been taking as deep of breath due to concern for pain.  She denies any associated lightheadedness, nausea, vomiting, headache, diaphoresis.  She does not smoke cigarettes; quit 5 years ago.  She does report occasionally smoking marijuana and symptoms began after recently smoking.  She denies any history of COPD.  She had allergies and asthma when she was younger but has not needed medication for this nodule.  She is confident that she is not pregnant.  She denies history of cardiovascular disease.  She denies any association with activity.   Past Medical History:  Diagnosis Date   Anxiety    Asthma    Depression    Ectopic pregnancy    Irritable bowel syndrome (IBS) .   PTSD (post-traumatic stress disorder)     Patient Active Problem List   Diagnosis Date Noted   GERD (gastroesophageal reflux disease) 06/08/2017   MDD (major depressive disorder), recurrent episode, severe (HCC) 11/16/2015   Marijuana abuse     Past Surgical History:  Procedure Laterality Date   TONSILLECTOMY     TUBAL LIGATION      OB History   No obstetric history on file.      Home Medications    Prior to Admission medications   Medication Sig Start Date End Date Taking? Authorizing Provider  predniSONE (STERAPRED UNI-PAK 21 TAB) 10 MG (21) TBPK tablet As directed 02/14/22  Yes Rachna Schonberger K, PA-C  atorvastatin (LIPITOR) 20 MG tablet Take 1 tablet (20 mg total) by mouth  daily. Patient not taking: Reported on 02/14/2022 04/30/21   Donita Brooks, MD  fluticasone [facility]) 50 MCG/ACT nasal spray Place 2 sprays into both nostrils daily. Patient not taking: Reported on 02/14/2022 07/29/21   Donita Brooks, MD  levocetirizine (XYZAL) 5 MG tablet Take 1 tablet (5 mg total) by mouth every evening. Patient not taking: Reported on 02/14/2022 07/29/21   Donita Brooks, MD  omeprazole (PRILOSEC) 40 MG capsule TAKE 1 CAPSULE BY MOUTH EVERY DAY Patient not taking: Reported on 02/14/2022 11/05/21   Donita Brooks, MD  sertraline (ZOLOFT) 50 MG tablet TAKE 1 TABLET BY MOUTH EVERY DAY Patient not taking: Reported on 02/14/2022 12/31/21   Donita Brooks, MD    Family History History reviewed. No pertinent family history.  Social History Social History   Tobacco Use   Smoking status: Former    Packs/day: 0.50    Types: Cigarettes   Smokeless tobacco: Never  Vaping Use   Vaping Use: Never used  Substance Use Topics   Alcohol use: Yes    Comment: social   Drug use: Not Currently    Types: Marijuana     Allergies   Amoxicillin and Latex   Review of Systems Review of Systems  Constitutional:  Positive for activity change. Negative for appetite change, fatigue and fever.  HENT:  Negative for congestion, sinus pressure, sneezing and sore throat.   Respiratory:  Positive for chest tightness and shortness of breath. Negative for cough.   Cardiovascular:  Positive for chest pain.  Gastrointestinal:  Negative for abdominal pain, diarrhea, nausea and vomiting.  Neurological:  Negative for dizziness, light-headedness and headaches.    Physical Exam Triage Vital Signs ED Triage Vitals  Enc Vitals Group     BP 02/14/22 1515 112/83     Pulse Rate 02/14/22 1515 94     Resp 02/14/22 1515 (!) 22     Temp 02/14/22 1515 98.1 F (36.7 C)     Temp Source 02/14/22 1515 Oral     SpO2 02/14/22 1515 96 %     Weight --      Height --      Head Circumference --       Peak Flow --      Pain Score 02/14/22 1510 8     Pain Loc --      Pain Edu? --      Excl. in [place]? --    No data found.  Updated Vital Signs BP 112/83 (BP Location: Left Arm) Comment (BP Location): large cuff  Pulse 94   Temp 98.1 F (36.7 C) (Oral)   Resp (!) 22   LMP 01/26/2022   SpO2 96%   Visual Acuity Right Eye Distance:   Left Eye Distance:   Bilateral Distance:    Right Eye Near:   Left Eye Near:    Bilateral Near:     Physical Exam Vitals reviewed.  Constitutional:      General: She is awake. She is not in acute distress.    Appearance: Normal appearance. She is well-developed. She is not ill-appearing.     Comments: Very pleasant female appears stated age in no acute distress sitting comfortably in exam room  HENT:     Head: Normocephalic and atraumatic.     Right Ear: Tympanic membrane, ear canal and external ear normal. Tympanic membrane is not erythematous or bulging.     Left Ear: Tympanic membrane, ear canal and external ear normal. Tympanic membrane is not erythematous or bulging.     Nose:     Right Sinus: No maxillary sinus tenderness or frontal sinus tenderness.     Left Sinus: No maxillary sinus tenderness or frontal sinus tenderness.     Mouth/Throat:     Pharynx: Uvula midline. Posterior oropharyngeal erythema present. No oropharyngeal exudate.     Comments: Significant erythema and drainage in posterior oropharynx Cardiovascular:     Rate and Rhythm: Normal rate and regular rhythm.     Heart sounds: Normal heart sounds, S1 normal and S2 normal. No murmur heard. Pulmonary:     Effort: Pulmonary effort is normal.     Breath sounds: Normal breath sounds. No wheezing, rhonchi or rales.     Comments: Clear to auscultation bilaterally Chest:     Chest wall: Tenderness present. No deformity or swelling.     Comments: Pain is reproducible on exam.  Tenderness palpation over anterior chest wall particularly over sternum. Musculoskeletal:     Cervical  back: No tenderness or bony tenderness.     Thoracic back: Tenderness present. No spasms or bony tenderness.     Lumbar back: No tenderness or bony tenderness.     Comments: Tenderness to palpation over bilateral thoracic paraspinal muscles.  No deformity or step-off noted.  No pain percussion of vertebrae.  Psychiatric:  Behavior: Behavior is cooperative.     UC Treatments / Results  Labs (all labs ordered are listed, but only abnormal results are displayed) Labs Reviewed - No data to display  EKG   Radiology DG Chest 2 View  Result Date: 02/14/2022 CLINICAL DATA:  Chest wall pain EXAM: CHEST - 2 VIEW COMPARISON:  12/05/2018 FINDINGS: The heart size and mediastinal contours are within normal limits. Both lungs are clear. The visualized skeletal structures are unremarkable. IMPRESSION: No active cardiopulmonary disease. Electronically Signed   By: Duanne Guess D.O.   On: 02/14/2022 15:51    Procedures Procedures (including critical care time)  Medications Ordered in UC Medications - No data to display  Initial Impression / Assessment and Plan / UC Course  I have reviewed the triage vital signs and the nursing notes.  Pertinent labs & imaging results that were available during my care of the patient were reviewed by me and considered in my medical decision making (see chart for details).     Patient is well-appearing, afebrile, nontachycardic.  Discussed that symptoms are likely musculoskeletal given pain is reproducible on exam and worse with movement.  Chest x-ray was obtained which showed no acute cardiopulmonary disease.  We will start prednisone taper for symptom relief and patient was instructed not to take NSAIDs with this medication due to risk of GI bleeding.  Discussed side effects of prednisone if she has significant side effects she can contact us for direction on how to stop the medication early.  She can also use Benadryl for side effects.  Recommended she  avoid smoke and other lung irritants.  If anything worsens and she has severe changing chest pain, shortness of breath, nausea/vomiting, fever, weakness, lethargy she is to go to the emergency room.  Strict return precautions given to which she expressed understanding.  Final Clinical Impressions(s) / UC Diagnoses   Final diagnoses:  Chest wall pain  Costochondritis  Acute bilateral thoracic back pain     Discharge Instructions      Your chest x-ray was normal which is great news.  I believe that you have costochondritis or inflammation of your ribs.  Start prednisone taper as prescribed.  Do not take NSAIDs including aspirin, ibuprofen/Advil, naproxen/Aleve with this medication as it can cause stomach bleeding.  If you have any worsening symptoms including shortness of breath, worsening pain, nausea/vomiting, fever you need to be seen immediately.  If symptoms do not improve by next week return here or see your PCP.  Avoid smoke and other lung irritants.     ED Prescriptions     Medication Sig Dispense Auth. Provider   predniSONE (STERAPRED UNI-PAK 21 TAB) 10 MG (21) TBPK tablet As directed 21 tablet Kimara Bencomo K, PA-C      PDMP not reviewed this encounter.   Jeani Hawking, PA-C 02/14/22 1623

## 2022-02-24 ENCOUNTER — Ambulatory Visit: Payer: Medicaid Other | Admitting: Family Medicine

## 2022-02-26 DIAGNOSIS — Z419 Encounter for procedure for purposes other than remedying health state, unspecified: Secondary | ICD-10-CM | POA: Diagnosis not present

## 2022-03-01 DIAGNOSIS — Z20822 Contact with and (suspected) exposure to covid-19: Secondary | ICD-10-CM | POA: Diagnosis not present

## 2022-03-12 DIAGNOSIS — Z20822 Contact with and (suspected) exposure to covid-19: Secondary | ICD-10-CM | POA: Diagnosis not present

## 2022-03-16 DIAGNOSIS — Z20822 Contact with and (suspected) exposure to covid-19: Secondary | ICD-10-CM | POA: Diagnosis not present

## 2022-03-28 DIAGNOSIS — Z419 Encounter for procedure for purposes other than remedying health state, unspecified: Secondary | ICD-10-CM | POA: Diagnosis not present

## 2022-03-31 DIAGNOSIS — Z20822 Contact with and (suspected) exposure to covid-19: Secondary | ICD-10-CM | POA: Diagnosis not present

## 2022-04-28 DIAGNOSIS — Z419 Encounter for procedure for purposes other than remedying health state, unspecified: Secondary | ICD-10-CM | POA: Diagnosis not present

## 2022-05-13 ENCOUNTER — Other Ambulatory Visit: Payer: Self-pay | Admitting: Family Medicine

## 2022-05-13 DIAGNOSIS — Z1231 Encounter for screening mammogram for malignant neoplasm of breast: Secondary | ICD-10-CM

## 2022-05-23 ENCOUNTER — Ambulatory Visit: Payer: Medicaid Other | Admitting: Family Medicine

## 2022-05-28 DIAGNOSIS — Z419 Encounter for procedure for purposes other than remedying health state, unspecified: Secondary | ICD-10-CM | POA: Diagnosis not present

## 2022-05-30 ENCOUNTER — Ambulatory Visit: Payer: Medicaid Other

## 2022-06-08 ENCOUNTER — Ambulatory Visit
Admission: RE | Admit: 2022-06-08 | Discharge: 2022-06-08 | Disposition: A | Discharge status: Home / Self Care | Payer: Medicaid Other | Source: Ambulatory Visit | Attending: Family Medicine | Admitting: Family Medicine

## 2022-06-08 DIAGNOSIS — Z1231 Encounter for screening mammogram for malignant neoplasm of breast: Secondary | ICD-10-CM | POA: Diagnosis not present

## 2022-06-28 DIAGNOSIS — Z419 Encounter for procedure for purposes other than remedying health state, unspecified: Secondary | ICD-10-CM | POA: Diagnosis not present

## 2022-07-29 DIAGNOSIS — Z419 Encounter for procedure for purposes other than remedying health state, unspecified: Secondary | ICD-10-CM | POA: Diagnosis not present

## 2022-08-19 ENCOUNTER — Ambulatory Visit (HOSPITAL_COMMUNITY)
Admission: RE | Admit: 2022-08-19 | Discharge: 2022-08-19 | Disposition: A | Discharge status: Home / Self Care | Payer: Medicaid Other | Source: Ambulatory Visit | Attending: Physician Assistant | Admitting: Physician Assistant

## 2022-08-19 ENCOUNTER — Encounter (HOSPITAL_COMMUNITY): Payer: Self-pay

## 2022-08-19 VITALS — BP 105/65 | HR 75 | Temp 98.1°F | Resp 16

## 2022-08-19 DIAGNOSIS — N760 Acute vaginitis: Secondary | ICD-10-CM | POA: Diagnosis not present

## 2022-08-19 DIAGNOSIS — N898 Other specified noninflammatory disorders of vagina: Secondary | ICD-10-CM

## 2022-08-19 MED ORDER — METRONIDAZOLE 0.75 % VA GEL: 1.0000 | Freq: Every day | VAGINAL | 0 refills | Status: DC

## 2022-08-19 NOTE — ED Provider Notes (Signed)
[facility]    CSN: [id number] Arrival date & time: 08/19/22  1026      History   Chief Complaint Chief Complaint  Patient presents with   Vaginal Discharge    HPI [name] is a 41 y.o. female.   Patient presents today with a 2-day history of vaginal discharge.  She has no specific concern for STI as she is in a monogamous relationship.  Reports that discharge is copious, thin, malodorous.  She does report that she recently got off her menstrual cycle and use new pads which is this could have triggered symptoms.  She has a history of bacterial vaginosis and states current symptoms are similar previous episodes of this condition.  She denies any changes to personal hygiene products including soaps or detergents.  Denies any recent antibiotic use.  She is confident she is not pregnant.  Denies any abdominal pain, pelvic pain, fever, nausea, vomiting.    Past Medical History:  Diagnosis Date   Anxiety    Asthma    Depression    Ectopic pregnancy    Irritable bowel syndrome (IBS) .   PTSD (post-traumatic stress disorder)     Patient Active Problem List   Diagnosis Date Noted   GERD (gastroesophageal reflux disease) 06/08/2017   MDD (major depressive disorder), recurrent episode, severe ([place]) 11/16/2015   Marijuana abuse     Past Surgical History:  Procedure Laterality Date   TONSILLECTOMY     TUBAL LIGATION      OB History   No obstetric history on file.      Home Medications    Prior to Admission medications   Medication Sig Start Date End Date Taking? Authorizing Provider  metroNIDAZOLE (METROGEL VAGINAL) 0.75 % vaginal gel Place 1 Applicatorful vaginally at bedtime. 08/19/22  Yes Shakayla Hickox K, PA-C  atorvastatin (LIPITOR) 20 MG tablet Take 1 tablet (20 mg total) by mouth daily. Patient not taking: Reported on 02/14/2022 04/30/21   Susy Frizzle, MD  fluticasone [facility]) 50 MCG/ACT nasal spray Place 2 sprays into both nostrils daily. Patient  not taking: Reported on 02/14/2022 07/29/21   Susy Frizzle, MD  levocetirizine (XYZAL) 5 MG tablet Take 1 tablet (5 mg total) by mouth every evening. Patient not taking: Reported on 02/14/2022 07/29/21   Susy Frizzle, MD  omeprazole (PRILOSEC) 40 MG capsule TAKE 1 CAPSULE BY MOUTH EVERY DAY Patient not taking: Reported on 02/14/2022 11/05/21   Susy Frizzle, MD  predniSONE (STERAPRED UNI-PAK 21 TAB) 10 MG (21) TBPK tablet As directed 02/14/22   Beverely Suen K, PA-C  sertraline (ZOLOFT) 50 MG tablet TAKE 1 TABLET BY MOUTH EVERY DAY Patient not taking: Reported on 02/14/2022 12/31/21   Susy Frizzle, MD    Family History History reviewed. No pertinent family history.  Social History Social History   Tobacco Use   Smoking status: Former    Packs/day: 0.50    Types: Cigarettes   Smokeless tobacco: Never  Vaping Use   Vaping Use: Never used  Substance Use Topics   Alcohol use: Yes    Comment: social   Drug use: Not Currently    Types: Marijuana     Allergies   Amoxicillin and Latex   Review of Systems Review of Systems  Constitutional:  Positive for activity change. Negative for appetite change, fatigue and fever.  Gastrointestinal:  Negative for abdominal pain, diarrhea, nausea and vomiting.  Genitourinary:  Positive for vaginal discharge. Negative for dysuria, frequency,  pelvic pain, urgency, vaginal bleeding and vaginal pain.     Physical Exam Triage Vital Signs ED Triage Vitals  Enc Vitals Group     BP 08/19/22 1036 105/65     Pulse Rate 08/19/22 1034 75     Resp 08/19/22 1034 16     Temp 08/19/22 1034 98.1 F (36.7 C)     Temp Source 08/19/22 1034 Oral     SpO2 08/19/22 1034 99 %     Weight --      Height --      Head Circumference --      Peak Flow --      Pain Score 08/19/22 1035 0     Pain Loc --      Pain Edu? --      Excl. in [place]? --    No data found.  Updated Vital Signs BP 105/65 (BP Location: Left Arm)   Pulse 75   Temp 98.1 F (36.7  C) (Oral)   Resp 16   LMP 08/12/2022   SpO2 99%   Visual Acuity Right Eye Distance:   Left Eye Distance:   Bilateral Distance:    Right Eye Near:   Left Eye Near:    Bilateral Near:     Physical Exam Vitals reviewed.  Constitutional:      General: She is awake. She is not in acute distress.    Appearance: Normal appearance. She is well-developed. She is not ill-appearing.     Comments: Very pleasant female appears stated age no acute distress sitting comfortably in exam room  HENT:     Head: Normocephalic and atraumatic.  Cardiovascular:     Rate and Rhythm: Normal rate and regular rhythm.     Heart sounds: Normal heart sounds, S1 normal and S2 normal. No murmur heard. Pulmonary:     Effort: Pulmonary effort is normal.     Breath sounds: Normal breath sounds. No wheezing, rhonchi or rales.     Comments: Clear to auscultation bilaterally Abdominal:     General: Bowel sounds are normal.     Palpations: Abdomen is soft.     Tenderness: There is no abdominal tenderness. There is no right CVA tenderness, left CVA tenderness, guarding or rebound.     Comments: Benign abdominal exam  Psychiatric:        Behavior: Behavior is cooperative.      UC Treatments / Results  Labs (all labs ordered are listed, but only abnormal results are displayed) Labs Reviewed  CERVICOVAGINAL ANCILLARY ONLY    EKG   Radiology No results found.  Procedures Procedures (including critical care time)  Medications Ordered in UC Medications - No data to display  Initial Impression / Assessment and Plan / UC Course  I have reviewed the triage vital signs and the nursing notes.  Pertinent labs & imaging results that were available during my care of the patient were reviewed by me and considered in my medical decision making (see chart for details).     STI swab collected today-results pending.  Will empirically treat for bacterial vaginosis given clinical presentation.  We will contact  her if we need to arrange any additional treatment based on swab results.  Patient reports often has side effects with oral metronidazole so we will use metronidazole gel.  She was encouraged to use hypoallergenic soaps and detergents and wear loosefitting cotton underwear.  If she has any persistent or worsening symptoms she is to return for reevaluation.  Final Clinical  Impressions(s) / UC Diagnoses   Final diagnoses:  Acute vaginitis  Vaginal discharge     Discharge Instructions      We are treating you for bacterial vaginosis.  Use MetroGel as prescribed.  Do not drink alcohol while on this medication as it can cause you to vomit.  Wear loosefitting cotton underwear.  Use hypoallergenic soaps and detergents.  We will contact you if we need to arrange any additional treatment.  If anything worsens please return for reevaluation.     ED Prescriptions     Medication Sig Dispense Auth. Provider   metroNIDAZOLE (METROGEL VAGINAL) 0.75 % vaginal gel Place 1 Applicatorful vaginally at bedtime. 70 g Yuriko Portales, Derry Skill, PA-C      PDMP not reviewed this encounter.   Terrilee Croak, PA-C 08/19/22 1127

## 2022-08-19 NOTE — Discharge Instructions (Addendum)
We are treating you for bacterial vaginosis.  Use MetroGel as prescribed.  Do not drink alcohol while on this medication as it can cause you to vomit.  Wear loosefitting cotton underwear.  Use hypoallergenic soaps and detergents.  We will contact you if we need to arrange any additional treatment.  If anything worsens please return for reevaluation.

## 2022-08-19 NOTE — ED Triage Notes (Signed)
Pt states vaginal discharge and itching for the past 2 days.

## 2022-08-22 LAB — CERVICOVAGINAL ANCILLARY ONLY
Bacterial Vaginitis (gardnerella): POSITIVE — AB
Candida Glabrata: NEGATIVE
Candida Vaginitis: NEGATIVE
Chlamydia: NEGATIVE
Comment: NEGATIVE
Comment: NEGATIVE
Comment: NEGATIVE
Comment: NEGATIVE
Comment: NEGATIVE
Comment: NORMAL
Neisseria Gonorrhea: NEGATIVE
Trichomonas: POSITIVE — AB

## 2022-08-23 ENCOUNTER — Telehealth (HOSPITAL_COMMUNITY): Payer: Self-pay

## 2022-08-23 MED ORDER — METRONIDAZOLE 500 MG PO TABS: 500.0000 mg | ORAL_TABLET | Freq: Two times a day (BID) | ORAL | 0 refills | Status: DC

## 2022-08-28 DIAGNOSIS — Z419 Encounter for procedure for purposes other than remedying health state, unspecified: Secondary | ICD-10-CM | POA: Diagnosis not present

## 2022-09-21 DIAGNOSIS — Z1152 Encounter for screening for COVID-19: Secondary | ICD-10-CM | POA: Diagnosis not present

## 2022-09-27 ENCOUNTER — Ambulatory Visit (INDEPENDENT_AMBULATORY_CARE_PROVIDER_SITE_OTHER): Payer: Medicaid Other | Admitting: Family Medicine

## 2022-09-27 ENCOUNTER — Encounter: Payer: Self-pay | Admitting: Family Medicine

## 2022-09-27 VITALS — HR 73 | Ht 68.0 in | Wt 310.2 lb

## 2022-09-27 DIAGNOSIS — E7800 Pure hypercholesterolemia, unspecified: Secondary | ICD-10-CM | POA: Diagnosis not present

## 2022-09-27 DIAGNOSIS — Z124 Encounter for screening for malignant neoplasm of cervix: Secondary | ICD-10-CM | POA: Diagnosis not present

## 2022-09-27 DIAGNOSIS — Z202 Contact with and (suspected) exposure to infections with a predominantly sexual mode of transmission: Secondary | ICD-10-CM | POA: Diagnosis not present

## 2022-09-27 DIAGNOSIS — Z0000 Encounter for general adult medical examination without abnormal findings: Secondary | ICD-10-CM

## 2022-09-27 NOTE — Addendum Note (Signed)
Addended by: Randal Buba K on: 09/27/2022 04:30 PM   Modules accepted: Orders

## 2022-09-27 NOTE — Progress Notes (Signed)
Subjective:    Patient ID: [name], female    DOB: 07/26/1981, 41 y.o.   MRN: [id number]  Patient is here today for complete physical exam.  Unfortunately she recently discovered that her partner had been unfaithful she was diagnosed with trichomoniasis.  Therefore she is requesting STD check.  She would like to be screened for HIV, syphilis, gonorrhea and chlamydia.  She is very tearful today and she is also going through a difficult time with the relationship having ended.  Her BMI is elevated at 47.    Past Medical History:  Diagnosis Date   Anxiety    Asthma    Depression    Ectopic pregnancy    Irritable bowel syndrome (IBS) .   PTSD (post-traumatic stress disorder)    Past Surgical History:  Procedure Laterality Date   TONSILLECTOMY     TUBAL LIGATION     No current outpatient medications on file prior to visit.   No current facility-administered medications on file prior to visit.   Allergies  Allergen Reactions   Amoxicillin Swelling   Latex Rash    Reaction to gloves   Social History   Socioeconomic History   Marital status: Single    Spouse name: Not on file   Number of children: Not on file   Years of education: Not on file   Highest education level: Not on file  Occupational History   Not on file  Tobacco Use   Smoking status: Former    Packs/day: 0.50    Types: Cigarettes   Smokeless tobacco: Never  Vaping Use   Vaping Use: Never used  Substance and Sexual Activity   Alcohol use: Yes    Comment: social   Drug use: Not Currently    Types: Marijuana   Sexual activity: Yes    Birth control/protection: None  Other Topics Concern   Not on file  Social History Narrative   Not on file   Social Determinants of Health   Financial Resource Strain: Not on file  Food Insecurity: Not on file  Transportation Needs: Not on file  Physical Activity: Not on file  Stress: Not on file  Social Connections: Not on file  Intimate Partner Violence: Not  on file   No family history on file.    Review of Systems  Psychiatric/Behavioral:  Positive for depression.   All other systems reviewed and are negative.      Objective:   Physical Exam Vitals reviewed.  Constitutional:      General: She is not in acute distress.    Appearance: She is obese. She is not ill-appearing, toxic-appearing or diaphoretic.  HENT:     Head: Normocephalic and atraumatic.     Right Ear: Tympanic membrane, ear canal and external ear normal. There is no impacted cerumen.     Left Ear: Tympanic membrane, ear canal and external ear normal. There is no impacted cerumen.     Nose: Nose normal. No congestion or rhinorrhea.     Mouth/Throat:     Mouth: Mucous membranes are moist.     Pharynx: Oropharynx is clear. No oropharyngeal exudate or posterior oropharyngeal erythema.  Eyes:     General: No scleral icterus.       Right eye: No discharge.        Left eye: No discharge.     Extraocular Movements: Extraocular movements intact.     Conjunctiva/sclera: Conjunctivae normal.     Pupils: Pupils are equal, round, and  reactive to light.  Neck:     Vascular: No carotid bruit.  Cardiovascular:     Rate and Rhythm: Normal rate and regular rhythm.     Pulses: Normal pulses.     Heart sounds: Normal heart sounds. No murmur heard.    No friction rub. No gallop.  Pulmonary:     Effort: Pulmonary effort is normal. No respiratory distress.     Breath sounds: Normal breath sounds. No stridor. No wheezing, rhonchi or rales.  Chest:     Chest wall: No tenderness.  Abdominal:     General: Abdomen is flat. Bowel sounds are normal. There is no distension.     Palpations: Abdomen is soft. There is no mass.     Tenderness: There is no abdominal tenderness. There is no guarding or rebound.     Hernia: No hernia is present.  Genitourinary:    Cervix: Normal.     Adnexa: Right adnexa normal and left adnexa normal.  Musculoskeletal:        General: Normal range of  motion.     Cervical back: Normal range of motion and neck supple. No muscular tenderness.     Right lower leg: No edema.     Left lower leg: No edema.  Lymphadenopathy:     Cervical: No cervical adenopathy.  Skin:    General: Skin is warm.     Capillary Refill: Capillary refill takes less than 2 seconds.     Coloration: Skin is not jaundiced or pale.     Findings: No bruising, erythema, lesion or rash.  Neurological:     General: No focal deficit present.     Mental Status: She is alert and oriented to person, place, and time. Mental status is at baseline.     Cranial Nerves: No cranial nerve deficit.     Sensory: No sensory deficit.     Motor: No weakness.     Coordination: Coordination normal.     Gait: Gait normal.     Deep Tendon Reflexes: Reflexes normal.  Psychiatric:        Mood and Affect: Mood normal.        Behavior: Behavior normal.        Thought Content: Thought content normal.        Judgment: Judgment normal.           Assessment & Plan:  General medical exam  Cervical cancer screening - Plan: PAP, Thin Prep w/HPV rflx HPV Type 16/18  Pure hypercholesterolemia - Plan: CBC with Differential/Platelet, COMPLETE METABOLIC PANEL WITH GFR, Lipid panel  STD exposure - Plan: HIV Antibody (routine testing w rflx), C. trachomatis/N. gonorrhoeae RNA, RPR Patient is oriented mammogram this year.  Pap smear was sent to pathology and labeled container.  I will check HIV, RPR, gonorrhea and chlamydia.  Check CBC CMP and a lipid panel.  Offered a flu shot but the patient politely declined.  I would recommend [facility] for weight loss and I encouraged the patient to check with her insurance to see if this is covered.  If it is covered I will be happy to prescribe this medication for her.

## 2022-09-28 DIAGNOSIS — Z419 Encounter for procedure for purposes other than remedying health state, unspecified: Secondary | ICD-10-CM | POA: Diagnosis not present

## 2022-09-28 LAB — COMPLETE METABOLIC PANEL WITH GFR
AG Ratio: 1.7 (calc) (ref 1.0–2.5)
ALT: 54 U/L — ABNORMAL HIGH (ref 6–29)
AST: 38 U/L — ABNORMAL HIGH (ref 10–30)
Albumin: 4 g/dL (ref 3.6–5.1)
Alkaline phosphatase (APISO): 84 U/L (ref 31–125)
BUN/Creatinine Ratio: 8 (calc) (ref 6–22)
BUN: 5 mg/dL — ABNORMAL LOW (ref 7–25)
CO2: 25 mmol/L (ref 20–32)
Calcium: 8.9 mg/dL (ref 8.6–10.2)
Chloride: 106 mmol/L (ref 98–110)
Creat: 0.62 mg/dL (ref 0.50–0.99)
Globulin: 2.4 g/dL (calc) (ref 1.9–3.7)
Glucose, Bld: 88 mg/dL (ref 65–99)
Potassium: 3.5 mmol/L (ref 3.5–5.3)
Sodium: 141 mmol/L (ref 135–146)
Total Bilirubin: 0.8 mg/dL (ref 0.2–1.2)
Total Protein: 6.4 g/dL (ref 6.1–8.1)
eGFR: 113 mL/min/{1.73_m2} (ref >=60)

## 2022-09-28 LAB — CBC WITH DIFFERENTIAL/PLATELET
Absolute Monocytes: 567 cells/uL (ref 200–950)
Basophils Absolute: 19 cells/uL (ref 0–200)
Basophils Relative: 0.2 %
Eosinophils Absolute: 93 cells/uL (ref 15–500)
Eosinophils Relative: 1 %
HCT: 40.2 % (ref 35.0–45.0)
Hemoglobin: 13.7 g/dL (ref 11.7–15.5)
Lymphs Abs: 1832 cells/uL (ref 850–3900)
MCH: 28.1 pg (ref 27.0–33.0)
MCHC: 34.1 g/dL (ref 32.0–36.0)
MCV: 82.4 fL (ref 80.0–100.0)
MPV: 11.8 fL (ref 7.5–12.5)
Monocytes Relative: 6.1 %
Neutro Abs: 6789 cells/uL (ref 1500–7800)
Neutrophils Relative %: 73 %
Platelets: 203 10*3/uL (ref 140–400)
RBC: 4.88 10*6/uL (ref 3.80–5.10)
RDW: 12 % (ref 11.0–15.0)
Total Lymphocyte: 19.7 %
WBC: 9.3 10*3/uL (ref 3.8–10.8)

## 2022-09-28 LAB — LIPID PANEL
Cholesterol: 157 mg/dL (ref <200)
HDL: 34 mg/dL — ABNORMAL LOW (ref >=50)
LDL Cholesterol (Calc): 100 mg/dL (calc) — ABNORMAL HIGH
Non-HDL Cholesterol (Calc): 123 mg/dL (calc) (ref <130)
Total CHOL/HDL Ratio: 4.6 (calc) (ref <5.0)
Triglycerides: 124 mg/dL (ref <150)

## 2022-09-28 LAB — HIV ANTIBODY (ROUTINE TESTING W REFLEX): HIV 1&2 Ab, 4th Generation: NONREACTIVE

## 2022-09-28 LAB — RPR: RPR Ser Ql: NONREACTIVE

## 2022-09-28 LAB — C. TRACHOMATIS/N. GONORRHOEAE RNA
C. trachomatis RNA, TMA: NOT DETECTED
N. gonorrhoeae RNA, TMA: NOT DETECTED

## 2022-09-29 LAB — PAP, TP IMAGING W/ HPV RNA, RFLX HPV TYPE 16,18/45: HPV DNA High Risk: NOT DETECTED

## 2022-10-03 ENCOUNTER — Other Ambulatory Visit: Payer: Self-pay

## 2022-10-03 DIAGNOSIS — B372 Candidiasis of skin and nail: Secondary | ICD-10-CM

## 2022-10-03 MED ORDER — CLOTRIMAZOLE-BETAMETHASONE 1-0.05 % EX CREA: 1.0000 | TOPICAL_CREAM | Freq: Every day | CUTANEOUS | 0 refills | Status: DC

## 2022-10-05 DIAGNOSIS — Z1152 Encounter for screening for COVID-19: Secondary | ICD-10-CM | POA: Diagnosis not present

## 2022-10-24 ENCOUNTER — Telehealth: Payer: Self-pay

## 2022-10-24 NOTE — Telephone Encounter (Signed)
Pt called in stating that she contacted her insurance and they will cover her prescription for [facility]. Pt would like to start the process of getting this med sent to pharmacy. Pt also stated that she will need a PA for this med as well. Please advise.  Cb#: [phone]

## 2022-10-25 ENCOUNTER — Telehealth: Payer: Self-pay

## 2022-10-25 NOTE — Telephone Encounter (Signed)
MY CHART MESSAGE FROM PT:  [name] is on national backorder. Unavailable until January.      From:Adora D Min      Sent:10/25/2022 10:38 AM EST        FI:EPPI Message Message List   Subject:Wegovy  I just called and they said they didn't have it. They said not unless my doctor writes it for something else but didn't give me no examples of what that could be or tell me nothing helpful so I'm at a loss for that. [name] I was told by a friend is another one but not sure of coverage.   ----- Message -----      From:Brindley D Denbleyker      Sent:10/25/2022 10:05 AM EST        RJ:JOAC Message Message List   Subject:Wegovy  Ok thanks I will do that today.   ----- Message -----      From: (proxy for Nurse Gerald Leitz)      Sent:10/25/2022  9:55 AM EST        [name]   Subject:Wegovy  Hi [name],   We received your message regarding your [phone]. We learned yesterday that [name] is on national backorder until after the first of the year. You may want to call your pharmacy and make sure they can get the medication first. Just let me know and we can then send the prescription in and do the Prior Authorization. Thank you!  [name]

## 2022-10-28 DIAGNOSIS — Z419 Encounter for procedure for purposes other than remedying health state, unspecified: Secondary | ICD-10-CM | POA: Diagnosis not present

## 2022-11-01 ENCOUNTER — Telehealth: Payer: Self-pay

## 2022-11-01 NOTE — Telephone Encounter (Signed)
Pt called in stating that she has been exposed to the flu and is now experiencing symptoms. Pt would like to know if there is a med that could be sent to the pharmacy for her symptoms. Please advise.   LOV: 09/27/22  PHARMACY: [facility] ON [address]  CB#: [phone]

## 2022-11-03 ENCOUNTER — Other Ambulatory Visit: Payer: Self-pay | Admitting: Family Medicine

## 2022-11-03 MED ORDER — OSELTAMIVIR PHOSPHATE 75 MG PO CAPS: 75.0000 mg | ORAL_CAPSULE | Freq: Two times a day (BID) | ORAL | 0 refills | Status: DC

## 2022-11-03 NOTE — Telephone Encounter (Signed)
Pls advice  

## 2022-11-28 DIAGNOSIS — Z419 Encounter for procedure for purposes other than remedying health state, unspecified: Secondary | ICD-10-CM | POA: Diagnosis not present

## 2022-12-08 ENCOUNTER — Telehealth: Payer: Self-pay

## 2022-12-08 ENCOUNTER — Other Ambulatory Visit: Payer: Self-pay | Admitting: Family Medicine

## 2022-12-08 MED ORDER — WEGOVY 0.5 MG/0.5ML ~~LOC~~ SOAJ: 0.5000 mg | SUBCUTANEOUS | 1 refills | Status: DC

## 2022-12-08 NOTE — Telephone Encounter (Signed)
Pt called in to request a refill of the [facility]. Pt would like a cb from the nurse if possible. Pt states that she does not know if this med will require a PA. Please advise.  LOV: 09/27/22  PH[facility]ACY: [facility] [address] [facility] [place]  CB#: [phone]

## 2022-12-27 ENCOUNTER — Telehealth: Payer: Self-pay

## 2022-12-27 NOTE — Telephone Encounter (Signed)
Pt called in to inquire about a PA for this med Semaglutide-Weight Management (WEGOVY) 0.5 MG/0.5ML Darden Palmer [[id number]]. Pt states that it has been a week and she has not heard from nurse. Pt would like a cb please.  Cb#: [phone]

## 2022-12-29 DIAGNOSIS — Z419 Encounter for procedure for purposes other than remedying health state, unspecified: Secondary | ICD-10-CM | POA: Diagnosis not present

## 2023-01-27 DIAGNOSIS — Z419 Encounter for procedure for purposes other than remedying health state, unspecified: Secondary | ICD-10-CM | POA: Diagnosis not present

## 2023-02-27 DIAGNOSIS — Z419 Encounter for procedure for purposes other than remedying health state, unspecified: Secondary | ICD-10-CM | POA: Diagnosis not present

## 2023-03-21 ENCOUNTER — Encounter (HOSPITAL_COMMUNITY): Payer: Self-pay

## 2023-03-21 ENCOUNTER — Ambulatory Visit (HOSPITAL_COMMUNITY)
Admission: RE | Admit: 2023-03-21 | Discharge: 2023-03-21 | Disposition: A | Discharge status: Home / Self Care | Payer: Medicaid Other | Source: Ambulatory Visit | Attending: Family Medicine | Admitting: Family Medicine

## 2023-03-21 VITALS — BP 127/89 | HR 98 | Temp 98.3°F | Resp 17

## 2023-03-21 DIAGNOSIS — J3089 Other allergic rhinitis: Secondary | ICD-10-CM | POA: Diagnosis not present

## 2023-03-21 DIAGNOSIS — Z202 Contact with and (suspected) exposure to infections with a predominantly sexual mode of transmission: Secondary | ICD-10-CM | POA: Insufficient documentation

## 2023-03-21 LAB — HIV ANTIBODY (ROUTINE TESTING W REFLEX): HIV Screen 4th Generation wRfx: NONREACTIVE

## 2023-03-21 MED ORDER — TRIAMCINOLONE ACETONIDE 40 MG/ML IJ SUSP
40.0000 mg | Freq: Once | INTRAMUSCULAR | Status: AC
  Administered 2023-03-21: 40 mg via INTRAMUSCULAR

## 2023-03-21 MED ORDER — TRIAMCINOLONE ACETONIDE 40 MG/ML IJ SUSP
INTRAMUSCULAR | Status: AC
  Filled 2023-03-21: qty 1

## 2023-03-21 MED ORDER — PREDNISONE 20 MG PO TABS: 40.0000 mg | ORAL_TABLET | Freq: Every day | ORAL | 0 refills | Status: AC

## 2023-03-21 MED ORDER — BENZONATATE 100 MG PO CAPS: 100.0000 mg | ORAL_CAPSULE | Freq: Three times a day (TID) | ORAL | 0 refills | Status: DC | PRN

## 2023-03-21 NOTE — ED Triage Notes (Addendum)
Pt reports smokes THC and smoked a lot 4/19. Reports ben having a cough. Last night had cold chills briefly. Took Allergy pil Sunday. 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 Denies exposure that is known. Little irritation after had her menstrual cycle.

## 2023-03-21 NOTE — Discharge Instructions (Signed)
You have been given a shot of triamcinolone 40 mg  Take prednisone 20 mg--2 daily for 5 days  Take benzonatate 100 mg, 1 tab every 8 hours as needed for cough.  Staff will notify you of any positives on the swab or blood tests.

## 2023-03-21 NOTE — ED Provider Notes (Signed)
[facility]    CSN: [id number] Arrival date & time: 03/21/23  1829      History   Chief Complaint Chief Complaint  Patient presents with   Cough    Been having rattling in chest and coughing since Saturday 03-18-23 - Entered by patient   appt 630   SEXUALLY TRANSMITTED DISEASE    HPI [name] is a 42 y.o. female.    Cough  Here for cough that began on April 20.  She states she smokes pot daily but she did smoke a lot on April 19.  No fever but may be some chills yesterday evening.  No nasal congestion or postnasal drainage.  The cough is fairly dry though she feels like there is some congestion in her throat. No wheezing and no feeling short of breath. She does have a history of asthma but has not had any wheezing since she was a child.  Also she has had a little irritation at her last.,  Which was on April 18.  She states she has just ended a relationship and had discovered that the person had had intercourse outside the relationship.  She would like testing for STDs including HIV and syphilis.     Past Medical History:  Diagnosis Date   Anxiety    Asthma    Depression    Ectopic pregnancy    Irritable bowel syndrome (IBS) .   PTSD (post-traumatic stress disorder)     Patient Active Problem List   Diagnosis Date Noted   Morbid obesity 12/27/2022   GERD (gastroesophageal reflux disease) 06/08/2017   MDD (major depressive disorder), recurrent episode, severe 11/16/2015   Marijuana abuse     Past Surgical History:  Procedure Laterality Date   TONSILLECTOMY     TUBAL LIGATION      OB History   No obstetric history on file.      Home Medications    Prior to Admission medications   Medication Sig Start Date End Date Taking? Authorizing Provider  benzonatate (TESSALON) 100 MG capsule Take 1 capsule (100 mg total) by mouth 3 (three) times daily as needed for cough. 03/21/23  Yes Zenia Resides, MD  predniSONE (DELTASONE) 20 MG tablet  Take 2 tablets (40 mg total) by mouth daily with breakfast for 5 days. 03/21/23 03/26/23 Yes Zenia Resides, MD    Family History No family history on file.  Social History Social History   Tobacco Use   Smoking status: Former    Packs/day: .5    Types: Cigarettes   Smokeless tobacco: Never  Vaping Use   Vaping Use: Never used  Substance Use Topics   Alcohol use: Yes    Comment: social   Drug use: Not Currently    Types: Marijuana     Allergies   Amoxicillin and Latex   Review of Systems Review of Systems  Respiratory:  Positive for cough.      Physical Exam Triage Vital Signs ED Triage Vitals  Enc Vitals Group     BP 03/21/23 1845 127/89     Pulse Rate 03/21/23 1845 98     Resp 03/21/23 1845 17     Temp 03/21/23 1845 98.3 F (36.8 C)     Temp Source 03/21/23 1845 Oral     SpO2 03/21/23 1845 95 %     Weight --      Height --      Head Circumference --      Peak  Flow --      Pain Score 03/21/23 1844 0     Pain Loc --      Pain Edu? --      Excl. in GC? --    No data found.  Updated Vital Signs BP 127/89 (BP Location: Right Arm)   Pulse 98   Temp 98.3 F (36.8 C) (Oral)   Resp 17   LMP 03/16/2023   SpO2 95%   Visual Acuity Right Eye Distance:   Left Eye Distance:   Bilateral Distance:    Right Eye Near:   Left Eye Near:    Bilateral Near:     Physical Exam Vitals reviewed.  Constitutional:      General: She is not in acute distress.    Appearance: She is not ill-appearing, toxic-appearing or diaphoretic.  HENT:     Right Ear: Tympanic membrane and ear canal normal.     Left Ear: Tympanic membrane and ear canal normal.     Nose: Nose normal.     Mouth/Throat:     Mouth: Mucous membranes are moist.     Pharynx: No oropharyngeal exudate or posterior oropharyngeal erythema.  Eyes:     Extraocular Movements: Extraocular movements intact.     Conjunctiva/sclera: Conjunctivae normal.     Pupils: Pupils are equal, round, and reactive to  light.  Cardiovascular:     Rate and Rhythm: Normal rate and regular rhythm.     Heart sounds: No murmur heard. Pulmonary:     Effort: Pulmonary effort is normal. No respiratory distress.     Breath sounds: No stridor. No wheezing, rhonchi or rales.  Musculoskeletal:     Cervical back: Neck supple.  Lymphadenopathy:     Cervical: No cervical adenopathy.  Skin:    Capillary Refill: Capillary refill takes less than 2 seconds.     Coloration: Skin is not jaundiced or pale.  Neurological:     General: No focal deficit present.     Mental Status: She is alert and oriented to person, place, and time.  Psychiatric:        Behavior: Behavior normal.      UC Treatments / Results  Labs (all labs ordered are listed, but only abnormal results are displayed) Labs Reviewed  RPR  HIV ANTIBODY (ROUTINE TESTING W REFLEX)  CERVICOVAGINAL ANCILLARY ONLY    EKG   Radiology No results found.  Procedures Procedures (including critical care time)  Medications Ordered in UC Medications  triamcinolone acetonide (KENALOG-40) injection 40 mg (has no administration in time range)    Initial Impression / Assessment and Plan / UC Course  I have reviewed the triage vital signs and the nursing notes.  Pertinent labs & imaging results that were available during my care of the patient were reviewed by me and considered in my medical decision making (see chart for details).        Triamcinolone injection and prednisone burst are done to treat allergic rhinitis.  Tessalon Perles are sent in for the cough.  Vaginal self swab is done, and lab is drawn to check HIV and syphilis.  We will notify her of any positives and treat per protocol. Final Clinical Impressions(s) / UC Diagnoses   Final diagnoses:  Non-seasonal allergic rhinitis, unspecified trigger  Exposure to STD     Discharge Instructions      You have been given a shot of triamcinolone 40 mg  Take prednisone 20 mg--2 daily  for 5 days  Take benzonatate  100 mg, 1 tab every 8 hours as needed for cough.  Staff will notify you of any positives on the swab or blood tests.      ED Prescriptions     Medication Sig Dispense Auth. Provider   predniSONE (DELTASONE) 20 MG tablet Take 2 tablets (40 mg total) by mouth daily with breakfast for 5 days. 10 tablet Zenia Resides, MD   benzonatate (TESSALON) 100 MG capsule Take 1 capsule (100 mg total) by mouth 3 (three) times daily as needed for cough. 21 capsule Zenia Resides, MD      PDMP not reviewed this encounter.   Zenia Resides, MD 03/21/23 [phone]

## 2023-03-22 ENCOUNTER — Telehealth (HOSPITAL_COMMUNITY): Payer: Self-pay | Admitting: Emergency Medicine

## 2023-03-22 LAB — CERVICOVAGINAL ANCILLARY ONLY
Bacterial Vaginitis (gardnerella): POSITIVE — AB
Candida Glabrata: NEGATIVE
Candida Vaginitis: NEGATIVE
Chlamydia: NEGATIVE
Comment: NEGATIVE
Comment: NEGATIVE
Comment: NEGATIVE
Comment: NEGATIVE
Comment: NEGATIVE
Comment: NORMAL
Neisseria Gonorrhea: NEGATIVE
Trichomonas: NEGATIVE

## 2023-03-22 LAB — RPR: RPR Ser Ql: NONREACTIVE

## 2023-03-22 MED ORDER — METRONIDAZOLE 500 MG PO TABS
500.0000 mg | ORAL_TABLET | Freq: Two times a day (BID) | ORAL | 0 refills | Status: DC
Start: 2023-03-22 — End: 2023-05-31

## 2023-03-29 DIAGNOSIS — Z419 Encounter for procedure for purposes other than remedying health state, unspecified: Secondary | ICD-10-CM | POA: Diagnosis not present

## 2023-04-29 DIAGNOSIS — Z419 Encounter for procedure for purposes other than remedying health state, unspecified: Secondary | ICD-10-CM | POA: Diagnosis not present

## 2023-05-29 ENCOUNTER — Ambulatory Visit (HOSPITAL_COMMUNITY)
Admission: RE | Admit: 2023-05-29 | Discharge: 2023-05-29 | Disposition: A | Discharge status: Home / Self Care | Payer: Medicaid Other | Source: Ambulatory Visit | Attending: Physician Assistant | Admitting: Physician Assistant

## 2023-05-29 ENCOUNTER — Encounter (HOSPITAL_COMMUNITY): Payer: Self-pay

## 2023-05-29 VITALS — BP 118/84 | HR 82 | Temp 99.1°F | Resp 16 | Ht 68.0 in | Wt 296.0 lb

## 2023-05-29 DIAGNOSIS — Z202 Contact with and (suspected) exposure to infections with a predominantly sexual mode of transmission: Secondary | ICD-10-CM | POA: Diagnosis not present

## 2023-05-29 DIAGNOSIS — Z419 Encounter for procedure for purposes other than remedying health state, unspecified: Secondary | ICD-10-CM | POA: Diagnosis not present

## 2023-05-29 LAB — HIV ANTIBODY (ROUTINE TESTING W REFLEX): HIV Screen 4th Generation wRfx: NONREACTIVE

## 2023-05-29 LAB — HEPATITIS PANEL, ACUTE
HCV Ab: NONREACTIVE
Hep A IgM: NONREACTIVE
Hep B C IgM: NONREACTIVE
Hepatitis B Surface Ag: NONREACTIVE

## 2023-05-29 NOTE — Discharge Instructions (Signed)
Will call with test results and treat if indicated Abstain from sexually activity until test results are back.

## 2023-05-29 NOTE — ED Provider Notes (Signed)
[facility]    CSN: [id number] Arrival date & time: 05/29/23  1822      History   Chief Complaint Chief Complaint  Patient presents with   [name] TRANSMITTED DISEASE   Appt 1830    HPI [name] is a 42 y.o. female.   Patient presents for STD testing.  Reports a new partner told her recently they have hepatitis she is unsure if it is hepatitis C or Hep A. She is asymptomatic at this time. Reports she was also told this partner has trich.    Past Medical History:  Diagnosis Date   Anxiety    Asthma    Depression    Ectopic pregnancy    Irritable bowel syndrome (IBS) .   PTSD (post-traumatic stress disorder)     Patient Active Problem List   Diagnosis Date Noted   Morbid obesity (HCC) 12/27/2022   GERD (gastroesophageal reflux disease) 06/08/2017   MDD (major depressive disorder), recurrent episode, severe (HCC) 11/16/2015   Marijuana abuse     Past Surgical History:  Procedure Laterality Date   TONSILLECTOMY     TUBAL LIGATION      OB History   No obstetric history on file.      Home Medications    Prior to Admission medications   Medication Sig Start Date End Date Taking? Authorizing Provider  metroNIDAZOLE (FLAGYL) 500 MG tablet Take 1 tablet (500 mg total) by mouth 2 (two) times daily. 03/22/23   Lamptey, Britta Mccreedy, MD  benzonatate (TESSALON) 100 MG capsule Take 1 capsule (100 mg total) by mouth 3 (three) times daily as needed for cough. 03/21/23   Zenia Resides, MD    Family History History reviewed. No pertinent family history.  Social History Social History   Tobacco Use   Smoking status: Former    Packs/day: .5    Types: Cigarettes   Smokeless tobacco: Never  Vaping Use   Vaping Use: Never used  Substance Use Topics   Alcohol use: Yes    Comment: social   Drug use: Not Currently    Types: Marijuana     Allergies   Amoxicillin and Latex   Review of Systems Review of Systems  Constitutional:  Negative for  chills and fever.  HENT:  Negative for ear pain and sore throat.   Eyes:  Negative for pain and visual disturbance.  Respiratory:  Negative for cough and shortness of breath.   Cardiovascular:  Negative for chest pain and palpitations.  Gastrointestinal:  Negative for abdominal pain and vomiting.  Genitourinary:  Negative for dysuria and hematuria.  Musculoskeletal:  Negative for arthralgias and back pain.  Skin:  Negative for color change and rash.  Neurological:  Negative for seizures and syncope.  All other systems reviewed and are negative.    Physical Exam Triage Vital Signs ED Triage Vitals [05/29/23 1837]  Enc Vitals Group     BP 118/84     Pulse Rate 82     Resp 16     Temp 99.1 F (37.3 C)     Temp Source Oral     SpO2 96 %     Weight 296 lb (134.3 kg)     Height 5\' 8"  (1.727 m)     Head Circumference      Peak Flow      Pain Score 0     Pain Loc      Pain Edu?      Excl. in GC?  No data found.  Updated Vital Signs BP 118/84 (BP Location: Right Arm)   Pulse 82   Temp 99.1 F (37.3 C) (Oral)   Resp 16   Ht 5\' 8"  (1.727 m)   Wt 296 lb (134.3 kg)   LMP 05/28/2023 (Exact Date)   SpO2 96%   BMI 45.01 kg/m   Visual Acuity Right Eye Distance:   Left Eye Distance:   Bilateral Distance:    Right Eye Near:   Left Eye Near:    Bilateral Near:     Physical Exam Vitals and nursing note reviewed.  Constitutional:      General: She is not in acute distress.    Appearance: She is well-developed.  HENT:     Head: Normocephalic and atraumatic.  Eyes:     Conjunctiva/sclera: Conjunctivae normal.  Cardiovascular:     Rate and Rhythm: Normal rate and regular rhythm.     Heart sounds: No murmur heard. Pulmonary:     Effort: Pulmonary effort is normal. No respiratory distress.     Breath sounds: Normal breath sounds.  Abdominal:     Palpations: Abdomen is soft.     Tenderness: There is no abdominal tenderness.  Musculoskeletal:        General: No  swelling.     Cervical back: Neck supple.  Skin:    General: Skin is warm and dry.     Capillary Refill: Capillary refill takes less than 2 seconds.  Neurological:     Mental Status: She is alert.  Psychiatric:        Mood and Affect: Mood normal.      UC Treatments / Results  Labs (all labs ordered are listed, but only abnormal results are displayed) Labs Reviewed  HEPATITIS PANEL, ACUTE  HIV ANTIBODY (ROUTINE TESTING W REFLEX)  RPR  CERVICOVAGINAL ANCILLARY ONLY    EKG   Radiology No results found.  Procedures Procedures (including critical care time)  Medications Ordered in UC Medications - No data to display  Initial Impression / Assessment and Plan / UC Course  I have reviewed the triage vital signs and the nursing notes.  Pertinent labs & imaging results that were available during my care of the patient were reviewed by me and considered in my medical decision making (see chart for details).     STD exposure.  STD testing done today.  Pt is asymptomatic at this time.  Final Clinical Impressions(s) / UC Diagnoses   Final diagnoses:  STD exposure     Discharge Instructions      Will call with test results and treat if indicated Abstain from sexually activity until test results are back.    ED Prescriptions   None    PDMP not reviewed this encounter.   Ward, Tylene Fantasia, PA-C 05/29/23 [phone]

## 2023-05-29 NOTE — ED Triage Notes (Signed)
Patient here today to have some testing done for STDs. She was informed that her new partner was positive for Hep C and Trichomoniasis by his ex over the weekend.

## 2023-05-30 LAB — CERVICOVAGINAL ANCILLARY ONLY
Bacterial Vaginitis (gardnerella): POSITIVE — AB
Candida Glabrata: NEGATIVE
Candida Vaginitis: NEGATIVE
Chlamydia: NEGATIVE
Comment: NEGATIVE
Comment: NEGATIVE
Comment: NEGATIVE
Comment: NEGATIVE
Comment: NEGATIVE
Comment: NORMAL
Neisseria Gonorrhea: NEGATIVE
Trichomonas: NEGATIVE

## 2023-05-30 LAB — RPR: RPR Ser Ql: NONREACTIVE

## 2023-05-31 ENCOUNTER — Telehealth (HOSPITAL_COMMUNITY): Payer: Self-pay | Admitting: Emergency Medicine

## 2023-05-31 MED ORDER — METRONIDAZOLE 500 MG PO TABS: 500.0000 mg | ORAL_TABLET | Freq: Two times a day (BID) | ORAL | 0 refills | Status: DC

## 2023-06-07 ENCOUNTER — Ambulatory Visit: Payer: Self-pay

## 2023-06-07 ENCOUNTER — Telehealth: Payer: Self-pay | Admitting: Family Medicine

## 2023-06-07 NOTE — Telephone Encounter (Signed)
Prescription Request  06/07/2023  LOV: 09/27/2022  What is the name of the medication or equipment?   omeprazole (PRILOSEC) 40 MG capsule [[id number]]  DISCONTINUED  **New script needed**  Have you contacted your pharmacy to request a refill? Yes   Which pharmacy would you like this sent to?  [id number] [place], [place] - [address] AT [facility] [address] [place] [address] Phone: [phone] Fax: [phone]    Patient notified that their request is being sent to the clinical staff for review and that they should receive a response within 2 business days.   Please advise pharmacist.

## 2023-06-07 NOTE — Telephone Encounter (Signed)
Chief Complaint: Chest pain, reflux Symptoms: Burning mid chest at esophagus up and down neck, 8/10, nausea sometimes Frequency: Onset 3 days ago and constant Pertinent Negatives: Patient denies other symptoms Disposition: [] ED /[] Urgent Care (no appt availability in office) / [x] Appointment(In office/virtual)/ []  [facility]/ [] Home Care/ [] Refused Recommended Disposition /[] [facility]/ []  Follow-up with PCP Additional Notes: Patient called due to request for omeprazole not on current list. She says that she needs it due to the pain in chest from acid reflux. She says she usually take omeprazole for a while then stop and when the symptoms flare up, she starts taking it again. Advised she will need an OV to evaluate, she agrees. She asked for a late afternoon appointment. Advised only available is tomorrow at 1130, then it will be next Friday. She says she will go on lunch early and take the 1130 appointment. Scheduled for tomorrow morning with Dr. Tanya Nones.    Reason for Disposition  [1] Patient says chest pain feels exactly the same as previously diagnosed "heartburn" AND [2] describes burning in chest AND [3] accompanying sour taste in mouth  Answer Assessment - Initial Assessment Questions 1. LOCATION: "Where does it hurt?"       Esophagus 2. RADIATION: "Does the pain go anywhere else?" (e.g., into neck, jaw, arms, back)     Up and down throat 3. ONSET: "When did the chest pain begin?" (Minutes, hours or days)      3 days ago 4. PATTERN: "Does the pain come and go, or has it been constant since it started?"  "Does it get worse with exertion?"      Constant past 3 days 5. DURATION: "How long does it last" (e.g., seconds, minutes, hours)     Constant 6. SEVERITY: "How bad is the pain?"  (e.g., Scale 1-10; mild, moderate, or severe)    - MILD (1-3): doesn't interfere with normal activities     - MODERATE (4-7): interferes with normal activities or awakens from sleep     - SEVERE (8-10): excruciating pain, unable to do any normal activities       8-burning type pain 7. CARDIAC RISK FACTORS: "Do you have any history of heart problems or risk factors for heart disease?" (e.g., angina, prior heart attack; diabetes, high blood pressure, high cholesterol, smoker, or strong family history of heart disease)     No 8. PULMONARY RISK FACTORS: "Do you have any history of lung disease?"  (e.g., blood clots in lung, asthma, emphysema, birth control pills)     No 9. CAUSE: "What do you think is causing the chest pain?"     Acid reflux 10. OTHER SYMPTOMS: "Do you have any other symptoms?" (e.g., dizziness, nausea, vomiting, sweating, fever, difficulty breathing, cough)       Nausea 11. PREGNANCY: "Is there any chance you are pregnant?" "When was your last menstrual period?"       No  Protocols used: Chest Pain-A-AH

## 2023-06-07 NOTE — Telephone Encounter (Signed)
Patient called and advised omeprazole is not on her current medication list. She says it was taken off when she told the nurse she wasn't taking it, but she should've just left it there because now she needs a refill due to chest pain from acid reflux. Patient triaged and scheduled, see nurse triage encounter.

## 2023-06-08 ENCOUNTER — Ambulatory Visit: Payer: Medicaid Other | Admitting: Family Medicine

## 2023-06-08 ENCOUNTER — Other Ambulatory Visit: Payer: Self-pay

## 2023-06-08 DIAGNOSIS — K219 Gastro-esophageal reflux disease without esophagitis: Secondary | ICD-10-CM

## 2023-06-08 MED ORDER — OMEPRAZOLE 40 MG PO CPDR
40.0000 mg | DELAYED_RELEASE_CAPSULE | Freq: Every day | ORAL | 1 refills | Status: AC
Start: 2023-06-08 — End: ?

## 2023-06-29 DIAGNOSIS — Z419 Encounter for procedure for purposes other than remedying health state, unspecified: Secondary | ICD-10-CM | POA: Diagnosis not present

## 2023-07-24 ENCOUNTER — Encounter (HOSPITAL_COMMUNITY): Payer: Self-pay

## 2023-07-24 ENCOUNTER — Ambulatory Visit (HOSPITAL_COMMUNITY)
Admission: RE | Admit: 2023-07-24 | Discharge: 2023-07-24 | Disposition: A | Discharge status: Home / Self Care | Payer: Medicaid Other | Source: Ambulatory Visit | Attending: Nurse Practitioner | Admitting: Nurse Practitioner

## 2023-07-24 VITALS — BP 137/87 | HR 79 | Temp 98.6°F | Resp 18

## 2023-07-24 DIAGNOSIS — Z113 Encounter for screening for infections with a predominantly sexual mode of transmission: Secondary | ICD-10-CM | POA: Insufficient documentation

## 2023-07-24 DIAGNOSIS — J019 Acute sinusitis, unspecified: Secondary | ICD-10-CM | POA: Diagnosis not present

## 2023-07-24 DIAGNOSIS — B9689 Other specified bacterial agents as the cause of diseases classified elsewhere: Secondary | ICD-10-CM | POA: Diagnosis not present

## 2023-07-24 LAB — HIV ANTIBODY (ROUTINE TESTING W REFLEX): HIV Screen 4th Generation wRfx: NONREACTIVE

## 2023-07-24 MED ORDER — DOXYCYCLINE HYCLATE 100 MG PO CAPS: 100.0000 mg | ORAL_CAPSULE | Freq: Two times a day (BID) | ORAL | 0 refills | Status: AC

## 2023-07-24 NOTE — ED Provider Notes (Signed)
[facility]    CSN: [id number] Arrival date & time: 07/24/23  1900      History   Chief Complaint Chief Complaint  Patient presents with   Headache    Started having a headache about a week ago and now it is gotten worse and it feels like sinus pressure. I would also like an STI screening while I'm there. - Entered by patient    HPI [name] is a 42 y.o. female.   Patient presents today with 6-day history of headache, nasal congestion, runny nose, tickly throat.  Patient denies fever, body aches or chills, cough, shortness of breath or chest pain.  No ear pain, abdominal pain, nausea/vomiting, or diarrhea.  No change in appetite or decreased energy levels.  Has not take anything for symptoms so far.  Reports she is to take allergy medication however she does not anymore.  Patient is also requesting STD testing.  She endorses recent unprotected sexual intercourse.  Denies any vaginal discharge, swelling in the groin, abdominal pain, nausea/vomiting, pelvic pain.  No vaginal rashes, sores, or lesions.  No known exposures to STI.  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    Past Medical History:  Diagnosis Date   Anxiety    Asthma    Depression    Ectopic pregnancy    Irritable bowel syndrome (IBS) .   PTSD (post-traumatic stress disorder)     Patient Active Problem List   Diagnosis Date Noted   Morbid obesity (HCC) 12/27/2022   GERD (gastroesophageal reflux disease) 06/08/2017   MDD (major depressive disorder), recurrent episode, severe (HCC) 11/16/2015   Marijuana abuse     Past Surgical History:  Procedure Laterality Date   TONSILLECTOMY     TUBAL LIGATION      OB History   No obstetric history on file.      Home Medications    Prior to Admission medications   Medication Sig Start Date End Date Taking? Authorizing Provider  doxycycline (VIBRAMYCIN) 100 MG capsule Take 1 capsule (100 mg total) by mouth 2 (two) times daily for 7  days. 07/24/23 07/31/23 Yes Valentino Nose, NP  omeprazole (PRILOSEC) 40 MG capsule Take 1 capsule (40 mg total) by mouth daily. 06/08/23   Donita Brooks, MD    Family History History reviewed. No pertinent family history.  Social History Social History   Tobacco Use   Smoking status: Former    Current packs/day: 0.50    Types: Cigarettes   Smokeless tobacco: Never  Vaping Use   Vaping status: Never Used  Substance Use Topics   Alcohol use: Yes    Comment: social   Drug use: Not Currently    Types: Marijuana     Allergies   Amoxicillin and Latex   Review of Systems Review of Systems Per HPI  Physical Exam Triage Vital Signs ED Triage Vitals  Encounter Vitals Group     BP 07/24/23 1913 137/87     Systolic BP Percentile --      Diastolic BP Percentile --      Pulse Rate 07/24/23 1913 79     Resp 07/24/23 1913 18     Temp 07/24/23 1913 98.6 F (37 C)     Temp src --      SpO2 07/24/23 1913 98 %     Weight --      Height --      Head Circumference --      Peak Flow --  Pain Score 07/24/23 1912 8     Pain Loc --      Pain Education --      Exclude from Growth Chart --    No data found.  Updated Vital Signs BP 137/87   Pulse 79   Temp 98.6 F (37 C)   Resp 18   LMP 07/20/2023   SpO2 98%   Visual Acuity Right Eye Distance:   Left Eye Distance:   Bilateral Distance:    Right Eye Near:   Left Eye Near:    Bilateral Near:     Physical Exam Vitals and nursing note reviewed.  Constitutional:      General: She is not in acute distress.    Appearance: Normal appearance. She is not ill-appearing or toxic-appearing.  HENT:     Head: Normocephalic and atraumatic.     Right Ear: Tympanic membrane, ear canal and external ear normal.     Left Ear: Tympanic membrane, ear canal and external ear normal.     Nose: No congestion or rhinorrhea.     Right Sinus: Maxillary sinus tenderness and frontal sinus tenderness present.     Left Sinus:  Maxillary sinus tenderness and frontal sinus tenderness present.     Mouth/Throat:     Mouth: Mucous membranes are moist.     Pharynx: Oropharynx is clear. Posterior oropharyngeal erythema present. No oropharyngeal exudate.  Eyes:     General: No scleral icterus.    Extraocular Movements: Extraocular movements intact.  Cardiovascular:     Rate and Rhythm: Normal rate and regular rhythm.  Pulmonary:     Effort: Pulmonary effort is normal. No respiratory distress.     Breath sounds: Normal breath sounds. No wheezing, rhonchi or rales.  Abdominal:     General: Abdomen is flat. Bowel sounds are normal. There is no distension.     Palpations: Abdomen is soft.  Genitourinary:    Comments: Deferred - self swab obtained by patient Musculoskeletal:     Cervical back: Normal range of motion and neck supple.  Lymphadenopathy:     Cervical: Cervical adenopathy present.  Skin:    General: Skin is warm and dry.     Capillary Refill: Capillary refill takes less than 2 seconds.     Coloration: Skin is not jaundiced or pale.     Findings: No erythema or rash.  Neurological:     Mental Status: She is alert and oriented to person, place, and time.  Psychiatric:        Behavior: Behavior is cooperative.      UC Treatments / Results  Labs (all labs ordered are listed, but only abnormal results are displayed) Labs Reviewed  HIV ANTIBODY (ROUTINE TESTING W REFLEX)  RPR  CERVICOVAGINAL ANCILLARY ONLY    EKG   Radiology No results found.  Procedures Procedures (including critical care time)  Medications Ordered in UC Medications - No data to display  Initial Impression / Assessment and Plan / UC Course  I have reviewed the triage vital signs and the nursing notes.  Pertinent labs & imaging results that were available during my care of the patient were reviewed by me and considered in my medical decision making (see chart for details).   Patient is well-appearing, normotensive,  afebrile, not tachycardic, not tachypneic, oxygenating well on room air.    1. Acute bacterial sinusitis Treat with doxycycline twice daily for 7 days Supportive care discussed ER and return precautions discussed Work excuse given  2. Screening  examination for STD (sexually transmitted disease) Patient is asymptomatic, no known exposures Treat as indicated Condoms given today; recommended condom use with every sexual encounter  The patient was given the opportunity to ask questions.  All questions answered to their satisfaction.  The patient is in agreement to this plan.    Final Clinical Impressions(s) / UC Diagnoses   Final diagnoses:  Acute bacterial sinusitis  Screening examination for STD (sexually transmitted disease)     Discharge Instructions      Take the doxycycline as prescribed to treat for a sinus infection.  Also recommend resuming allergy medication, taking guaifenesin to help loosen the mucus, and drinking plenty of water.  Nasal saline rinses are also beneficial.  We are testing today for gonorrhea, chlamydia, trichomoniasis, HIV, and syphilis.  Will contact you with any abnormal results.  Recommend condom use with every sexual encounter going forward.     ED Prescriptions     Medication Sig Dispense Auth. Provider   doxycycline (VIBRAMYCIN) 100 MG capsule Take 1 capsule (100 mg total) by mouth 2 (two) times daily for 7 days. 14 capsule Valentino Nose, NP      PDMP not reviewed this encounter.   Valentino Nose, NP 07/24/23 [phone]

## 2023-07-24 NOTE — ED Triage Notes (Signed)
Pt present with complaints of headache x 1 week. Concerned for sinus infection.   Pt also complains of having a new sexual partner where she did not use protection. Denies symptoms but would like std check.

## 2023-07-24 NOTE — Discharge Instructions (Addendum)
Take the doxycycline as prescribed to treat for a sinus infection.  Also recommend resuming allergy medication, taking guaifenesin to help loosen the mucus, and drinking plenty of water.  Nasal saline rinses are also beneficial.  We are testing today for gonorrhea, chlamydia, trichomoniasis, HIV, and syphilis.  Will contact you with any abnormal results.  Recommend condom use with every sexual encounter going forward.

## 2023-07-25 ENCOUNTER — Telehealth: Payer: Self-pay

## 2023-07-25 LAB — RPR: RPR Ser Ql: NONREACTIVE

## 2023-07-25 MED ORDER — FLUCONAZOLE 150 MG PO TABS: ORAL_TABLET | ORAL | 0 refills | Status: DC

## 2023-07-25 MED ORDER — METRONIDAZOLE 500 MG PO TABS: 500.0000 mg | ORAL_TABLET | Freq: Two times a day (BID) | ORAL | 0 refills | Status: DC

## 2023-07-26 LAB — CERVICOVAGINAL ANCILLARY ONLY
Bacterial Vaginitis (gardnerella): POSITIVE — AB
Candida Glabrata: NEGATIVE
Candida Vaginitis: POSITIVE — AB
Chlamydia: NEGATIVE
Comment: NEGATIVE
Comment: NEGATIVE
Comment: NEGATIVE
Comment: NEGATIVE
Comment: NEGATIVE
Comment: NORMAL
Neisseria Gonorrhea: NEGATIVE
Trichomonas: NEGATIVE

## 2023-07-30 DIAGNOSIS — Z419 Encounter for procedure for purposes other than remedying health state, unspecified: Secondary | ICD-10-CM | POA: Diagnosis not present

## 2023-08-04 ENCOUNTER — Other Ambulatory Visit: Payer: Self-pay | Admitting: Family Medicine

## 2023-08-04 DIAGNOSIS — Z1231 Encounter for screening mammogram for malignant neoplasm of breast: Secondary | ICD-10-CM

## 2023-08-07 ENCOUNTER — Ambulatory Visit
Admission: RE | Admit: 2023-08-07 | Discharge: 2023-08-07 | Disposition: A | Discharge status: Home / Self Care | Payer: Medicaid Other | Source: Ambulatory Visit | Attending: Family Medicine | Admitting: Family Medicine

## 2023-08-07 DIAGNOSIS — Z1231 Encounter for screening mammogram for malignant neoplasm of breast: Secondary | ICD-10-CM | POA: Diagnosis not present

## 2023-09-29 DIAGNOSIS — Z419 Encounter for procedure for purposes other than remedying health state, unspecified: Secondary | ICD-10-CM | POA: Diagnosis not present

## 2023-10-02 ENCOUNTER — Ambulatory Visit (INDEPENDENT_AMBULATORY_CARE_PROVIDER_SITE_OTHER): Payer: Medicaid Other | Admitting: Family Medicine

## 2023-10-02 ENCOUNTER — Encounter: Payer: Self-pay | Admitting: Family Medicine

## 2023-10-02 VITALS — BP 130/78 | HR 68 | Temp 98.6°F | Ht 68.0 in | Wt 299.0 lb

## 2023-10-02 DIAGNOSIS — Z0000 Encounter for general adult medical examination without abnormal findings: Secondary | ICD-10-CM | POA: Diagnosis not present

## 2023-10-02 DIAGNOSIS — R1084 Generalized abdominal pain: Secondary | ICD-10-CM

## 2023-10-02 DIAGNOSIS — Z202 Contact with and (suspected) exposure to infections with a predominantly sexual mode of transmission: Secondary | ICD-10-CM

## 2023-10-02 DIAGNOSIS — Z0001 Encounter for general adult medical examination with abnormal findings: Secondary | ICD-10-CM

## 2023-10-02 DIAGNOSIS — Z1211 Encounter for screening for malignant neoplasm of colon: Secondary | ICD-10-CM

## 2023-10-02 LAB — URINALYSIS, ROUTINE W REFLEX MICROSCOPIC
Bilirubin Urine: NEGATIVE
Glucose, UA: NEGATIVE
Hgb urine dipstick: NEGATIVE
Ketones, ur: NEGATIVE
Leukocytes,Ua: NEGATIVE
Nitrite: NEGATIVE
Protein, ur: NEGATIVE
Specific Gravity, Urine: 1.025 (ref 1.001–1.035)
pH: 7 (ref 5.0–8.0)

## 2023-10-02 NOTE — Progress Notes (Signed)
Subjective:    Patient ID: [name], female    DOB: 20-Aug-1981, 42 y.o.   MRN: [id number]  Patient is here today for complete physical exam.  Her Pap smear was performed last year.  Her mammogram was performed in September and is up-to-date.  She recently found out that her partner was unfaithful.  She would like STD screening as she believes she was exposed to an STD.  Otherwise she is doing well with no concerns.  She does have onychomycosis on her right great toenail.   Past Medical History:  Diagnosis Date   Anxiety    Asthma    Depression    Ectopic pregnancy    Irritable bowel syndrome (IBS) .   PTSD (post-traumatic stress disorder)    Past Surgical History:  Procedure Laterality Date   TONSILLECTOMY     TUBAL LIGATION     Current Outpatient Medications on File Prior to Visit  Medication Sig Dispense Refill   fluconazole (DIFLUCAN) 150 MG tablet 1 dose po at once, repeat in 3 days if needed 2 tablet 0   metroNIDAZOLE (FLAGYL) 500 MG tablet Take 1 tablet (500 mg total) by mouth 2 (two) times daily. 14 tablet 0   omeprazole (PRILOSEC) 40 MG capsule Take 1 capsule (40 mg total) by mouth daily. 90 capsule 1   No current facility-administered medications on file prior to visit.   Allergies  Allergen Reactions   Amoxicillin Swelling   Latex Rash    Reaction to gloves   Social History   Socioeconomic History   Marital status: Single    Spouse name: Not on file   Number of children: Not on file   Years of education: Not on file   Highest education level: Not on file  Occupational History   Not on file  Tobacco Use   Smoking status: Former    Current packs/day: 0.50    Types: Cigarettes   Smokeless tobacco: Never  Vaping Use   Vaping status: Never Used  Substance and Sexual Activity   Alcohol use: Yes    Comment: social   Drug use: Not Currently    Types: Marijuana   Sexual activity: Yes    Birth control/protection: None  Other Topics Concern   Not on file   Social History Narrative   Not on file   Social Determinants of Health   Financial Resource Strain: Not on file  Food Insecurity: Not on file  Transportation Needs: Not on file  Physical Activity: Not on file  Stress: Not on file  Social Connections: Not on file  Intimate Partner Violence: Not on file   No family history on file.    Review of Systems  All other systems reviewed and are negative.      Objective:   Physical Exam Vitals reviewed.  Constitutional:      General: She is not in acute distress.    Appearance: She is obese. She is not ill-appearing, toxic-appearing or diaphoretic.  HENT:     Head: Normocephalic and atraumatic.     Right Ear: Tympanic membrane, ear canal and external ear normal. There is no impacted cerumen.     Left Ear: Tympanic membrane, ear canal and external ear normal. There is no impacted cerumen.     Nose: Nose normal. No congestion or rhinorrhea.     Mouth/Throat:     Mouth: Mucous membranes are moist.     Pharynx: Oropharynx is clear. No oropharyngeal exudate or posterior oropharyngeal  erythema.  Eyes:     General: No scleral icterus.       Right eye: No discharge.        Left eye: No discharge.     Extraocular Movements: Extraocular movements intact.     Conjunctiva/sclera: Conjunctivae normal.     Pupils: Pupils are equal, round, and reactive to light.  Neck:     Vascular: No carotid bruit.  Cardiovascular:     Rate and Rhythm: Normal rate and regular rhythm.     Pulses: Normal pulses.     Heart sounds: Normal heart sounds. No murmur heard.    No friction rub. No gallop.  Pulmonary:     Effort: Pulmonary effort is normal. No respiratory distress.     Breath sounds: Normal breath sounds. No stridor. No wheezing, rhonchi or rales.  Chest:     Chest wall: No tenderness.  Abdominal:     General: Abdomen is flat. Bowel sounds are normal. There is no distension.     Palpations: Abdomen is soft. There is no mass.     Tenderness:  There is no abdominal tenderness. There is no guarding or rebound.     Hernia: No hernia is present.  Musculoskeletal:        General: Normal range of motion.     Cervical back: Normal range of motion and neck supple. No muscular tenderness.     Right lower leg: No edema.     Left lower leg: No edema.  Lymphadenopathy:     Cervical: No cervical adenopathy.  Skin:    General: Skin is warm.     Capillary Refill: Capillary refill takes less than 2 seconds.     Coloration: Skin is not jaundiced or pale.     Findings: No bruising, erythema, lesion or rash.  Neurological:     General: No focal deficit present.     Mental Status: She is alert and oriented to person, place, and time. Mental status is at baseline.     Cranial Nerves: No cranial nerve deficit.     Sensory: No sensory deficit.     Motor: No weakness.     Coordination: Coordination normal.     Gait: Gait normal.     Deep Tendon Reflexes: Reflexes normal.  Psychiatric:        Mood and Affect: Mood normal.        Behavior: Behavior normal.        Thought Content: Thought content normal.        Judgment: Judgment normal.           Assessment & Plan:  Generalized abdominal pain - Plan: Urinalysis, Routine w reflex microscopic  Colon cancer screening - Plan: Cologuard  Exposure to sexually transmitted disease (STD) - Plan: CBC with Differential/Platelet, COMPLETE METABOLIC PANEL WITH GFR, Lipid panel, HIV Antibody (routine testing w rflx), RPR, Hepatitis panel, acute, C. trachomatis/N. gonorrhoeae RNA, CANCELED: C. trachomatis/N. gonorrhoeae RNA  General medical exam - Plan: CBC with Differential/Platelet, COMPLETE METABOLIC PANEL WITH GFR, Lipid panel, HIV Antibody (routine testing w rflx), RPR, Hepatitis panel, acute, CANCELED: C. trachomatis/N. gonorrhoeae RNA Mammogram is up-to-date.  Pap smear is up-to-date.  Schedule patient for Cologuard as she turns 43 in January.  Check CBC CMP and lipid panel.  Recommended diet  exercise and weight loss.  Due to recent STD exposure, will check urine for gonorrhea and chlamydia, will check RPR, will check HIV, I will check an acute hepatitis panel.

## 2023-10-03 LAB — CBC WITH DIFFERENTIAL/PLATELET
Absolute Lymphocytes: 2082 {cells}/uL (ref 850–3900)
Absolute Monocytes: 679 {cells}/uL (ref 200–950)
Basophils Absolute: 23 {cells}/uL (ref 0–200)
Basophils Relative: 0.2 %
Eosinophils Absolute: 127 {cells}/uL (ref 15–500)
Eosinophils Relative: 1.1 %
HCT: 41.5 % (ref 35.0–45.0)
Hemoglobin: 13.4 g/dL (ref 11.7–15.5)
MCH: 27.6 pg (ref 27.0–33.0)
MCHC: 32.3 g/dL (ref 32.0–36.0)
MCV: 85.4 fL (ref 80.0–100.0)
MPV: 10.9 fL (ref 7.5–12.5)
Monocytes Relative: 5.9 %
Neutro Abs: 8591 {cells}/uL — ABNORMAL HIGH (ref 1500–7800)
Neutrophils Relative %: 74.7 %
Platelets: 222 10*3/uL (ref 140–400)
RBC: 4.86 10*6/uL (ref 3.80–5.10)
RDW: 12 % (ref 11.0–15.0)
Total Lymphocyte: 18.1 %
WBC: 11.5 10*3/uL — ABNORMAL HIGH (ref 3.8–10.8)

## 2023-10-03 LAB — COMPLETE METABOLIC PANEL WITH GFR
AG Ratio: 1.7 (calc) (ref 1.0–2.5)
ALT: 48 U/L — ABNORMAL HIGH (ref 6–29)
AST: 26 U/L (ref 10–30)
Albumin: 4.2 g/dL (ref 3.6–5.1)
Alkaline phosphatase (APISO): 80 U/L (ref 31–125)
BUN: 7 mg/dL (ref 7–25)
CO2: 28 mmol/L (ref 20–32)
Calcium: 9.1 mg/dL (ref 8.6–10.2)
Chloride: 105 mmol/L (ref 98–110)
Creat: 0.76 mg/dL (ref 0.50–0.99)
Globulin: 2.5 g/dL (ref 1.9–3.7)
Glucose, Bld: 79 mg/dL (ref 65–99)
Potassium: 3.6 mmol/L (ref 3.5–5.3)
Sodium: 141 mmol/L (ref 135–146)
Total Bilirubin: 0.6 mg/dL (ref 0.2–1.2)
Total Protein: 6.7 g/dL (ref 6.1–8.1)
eGFR: 99 mL/min/{1.73_m2} (ref >=60)

## 2023-10-03 LAB — C. TRACHOMATIS/N. GONORRHOEAE RNA
C. trachomatis RNA, TMA: NOT DETECTED
N. gonorrhoeae RNA, TMA: NOT DETECTED

## 2023-10-03 LAB — LIPID PANEL
Cholesterol: 184 mg/dL (ref <200)
HDL: 49 mg/dL — ABNORMAL LOW (ref >=50)
LDL Cholesterol (Calc): 113 mg/dL — ABNORMAL HIGH
Non-HDL Cholesterol (Calc): 135 mg/dL — ABNORMAL HIGH (ref <130)
Total CHOL/HDL Ratio: 3.8 (calc) (ref <5.0)
Triglycerides: 116 mg/dL (ref <150)

## 2023-10-03 LAB — HEPATITIS PANEL, ACUTE
Hep A IgM: NONREACTIVE
Hep B C IgM: NONREACTIVE
Hepatitis B Surface Ag: NONREACTIVE
Hepatitis C Ab: NONREACTIVE

## 2023-10-03 LAB — HIV ANTIBODY (ROUTINE TESTING W REFLEX): HIV 1&2 Ab, 4th Generation: NONREACTIVE

## 2023-10-03 LAB — RPR: RPR Ser Ql: NONREACTIVE

## 2023-10-29 DIAGNOSIS — Z419 Encounter for procedure for purposes other than remedying health state, unspecified: Secondary | ICD-10-CM | POA: Diagnosis not present

## 2023-11-29 DIAGNOSIS — Z419 Encounter for procedure for purposes other than remedying health state, unspecified: Secondary | ICD-10-CM | POA: Diagnosis not present

## 2023-12-30 DIAGNOSIS — Z419 Encounter for procedure for purposes other than remedying health state, unspecified: Secondary | ICD-10-CM | POA: Diagnosis not present

## 2024-01-27 DIAGNOSIS — Z419 Encounter for procedure for purposes other than remedying health state, unspecified: Secondary | ICD-10-CM | POA: Diagnosis not present

## 2024-03-09 DIAGNOSIS — Z419 Encounter for procedure for purposes other than remedying health state, unspecified: Secondary | ICD-10-CM | POA: Diagnosis not present

## 2024-03-14 ENCOUNTER — Telehealth (INDEPENDENT_AMBULATORY_CARE_PROVIDER_SITE_OTHER): Admitting: Family Medicine

## 2024-03-14 DIAGNOSIS — J302 Other seasonal allergic rhinitis: Secondary | ICD-10-CM | POA: Diagnosis not present

## 2024-03-14 MED ORDER — FLUTICASONE PROPIONATE 50 MCG/ACT NA SUSP: 2.0000 | Freq: Every day | NASAL | 6 refills | Status: AC

## 2024-03-14 MED ORDER — LEVOCETIRIZINE DIHYDROCHLORIDE 5 MG PO TABS: 5.0000 mg | ORAL_TABLET | Freq: Every evening | ORAL | 11 refills | Status: AC

## 2024-03-14 NOTE — Progress Notes (Signed)
 Subjective:    Patient ID: [name], female    DOB: [birth year], 43 y.o.   MRN: [id number]  HPI  Patient is being seen today as a telephone visit.  Phone call began at 4:00.  Phone call concluded around 410.  Patient is currently in her car.  I am currently at my office.  Patient consents to be seen via videoconference over her phone.  Patient states that her symptoms began about 3 weeks ago.  Symptoms include sinus pressure, rhinorrhea, head congestion.  She denies any fevers or chills.  She denies any sinus pain.  She denies any sore throat.  She denies any otalgia.  She has not tried any over-the-counter allergy medicine.  She would like to resume allergy medicine. Past Medical History:  Diagnosis Date   Anxiety    Asthma    Depression    Ectopic pregnancy    Irritable bowel syndrome (IBS) .   PTSD (post-traumatic stress disorder)    Past Surgical History:  Procedure Laterality Date   TONSILLECTOMY     TUBAL LIGATION     Current Outpatient Medications on File Prior to Visit  Medication Sig Dispense Refill   fluconazole (DIFLUCAN) 150 MG tablet 1 dose po at once, repeat in 3 days if needed 2 tablet 0   metroNIDAZOLE (FLAGYL) 500 MG tablet Take 1 tablet (500 mg total) by mouth 2 (two) times daily. 14 tablet 0   omeprazole (PRILOSEC) 40 MG capsule Take 1 capsule (40 mg total) by mouth daily. 90 capsule 1   No current facility-administered medications on file prior to visit.   Allergies  Allergen Reactions   Amoxicillin Swelling   Latex Rash    Reaction to gloves   Social History   Socioeconomic History   Marital status: Single    Spouse name: Not on file   Number of children: Not on file   Years of education: Not on file   Highest education level: 12th grade  Occupational History   Not on file  Tobacco Use   Smoking status: Former    Current packs/day: 0.50    Types: Cigarettes   Smokeless tobacco: Never  Vaping Use   Vaping status: Never Used  Substance and  Sexual Activity   Alcohol use: Yes    Comment: social   Drug use: Not Currently    Types: Marijuana   Sexual activity: Yes    Birth control/protection: None  Other Topics Concern   Not on file  Social History Narrative   Not on file   Social Drivers of Health   Financial Resource Strain: Patient Declined (03/14/2024)   Overall Financial Resource Strain (CARDIA)    Difficulty of Paying Living Expenses: Patient declined  Food Insecurity: Patient Declined (03/14/2024)   Hunger Vital Sign    Worried About Running Out of Food in the Last Year: Patient declined    Ran Out of Food in the Last Year: Patient declined  Transportation Needs: No Transportation Needs (03/14/2024)   PRAPARE - Administrator, Civil Service (Medical): No    Lack of Transportation (Non-Medical): No  Physical Activity: Unknown (03/14/2024)   Exercise Vital Sign    Days of Exercise per Week: Patient declined    Minutes of Exercise per Session: Not on file  Stress: Patient Declined (03/14/2024)   [employer] of Occupational Health - Occupational Stress Questionnaire    Feeling of Stress : Patient declined  Social Connections: Socially Isolated (03/14/2024)   Social  Connection and Isolation Panel [NHANES]    Frequency of Communication with Friends and Family: More than three times a week    Frequency of Social Gatherings with Friends and Family: Once a week    Attends Religious Services: Never    Database administrator or Organizations: No    Attends Engineer, structural: Not on file    Marital Status: Separated  Intimate Partner Violence: Not on file     Review of Systems  Constitutional:  Negative for fatigue and fever.  HENT:  Positive for rhinorrhea, sinus pressure and sneezing. Negative for facial swelling, sinus pain and sore throat.   Respiratory:  Positive for cough.        Objective:   Physical Exam        Assessment & Plan:  Seasonal allergies Start Xyzal 5 mg  daily.  If symptoms do not improve add Flonase 2 sprays each nostril daily.

## 2024-04-04 ENCOUNTER — Ambulatory Visit (HOSPITAL_COMMUNITY)
Admission: RE | Admit: 2024-04-04 | Discharge: 2024-04-04 | Disposition: A | Discharge status: Home / Self Care | Source: Ambulatory Visit

## 2024-04-04 ENCOUNTER — Encounter (HOSPITAL_COMMUNITY): Payer: Self-pay

## 2024-04-04 VITALS — BP 125/82 | HR 85 | Temp 97.8°F | Resp 16

## 2024-04-04 DIAGNOSIS — N898 Other specified noninflammatory disorders of vagina: Secondary | ICD-10-CM | POA: Diagnosis not present

## 2024-04-04 MED ORDER — METRONIDAZOLE 500 MG PO TABS: 500.0000 mg | ORAL_TABLET | Freq: Two times a day (BID) | ORAL | 0 refills | Status: AC

## 2024-04-04 MED ORDER — FLUCONAZOLE 150 MG PO TABS: 150.0000 mg | ORAL_TABLET | Freq: Every day | ORAL | 0 refills | Status: AC

## 2024-04-04 NOTE — ED Provider Notes (Signed)
 [facility]    CSN: [id number] Arrival date & time: 04/04/24  1813      History   Chief Complaint Chief Complaint  Patient presents with   Vaginal Itching    Feeling like a bacterial infection and I would like an std screening as well - Entered by patient    HPI [name] is a 43 y.o. female.   Patient is a 43 year old female who presents to the urgent care today with concerns of vaginal itching and irritation.  She reports her symptoms began yesterday.  She reports recently being sexually active with a new partner and did not use a condom.  She denies any other symptoms including vaginal discharge, abdominal pain, fever, rash, burning with urination, blood in the urine, or other concerns at this time.  She reports that she is extremely sensitive to getting BV and that this feels similar to that.  She has not taken any medicines or tried any over-the-counter creams to help with her symptoms.    Past Medical History:  Diagnosis Date   Anxiety    Asthma    Depression    Ectopic pregnancy    Irritable bowel syndrome (IBS) .   PTSD (post-traumatic stress disorder)     Patient Active Problem List   Diagnosis Date Noted   Morbid obesity (HCC) 12/27/2022   GERD (gastroesophageal reflux disease) 06/08/2017   MDD (major depressive disorder), recurrent episode, severe (HCC) 11/16/2015   Marijuana abuse     Past Surgical History:  Procedure Laterality Date   TONSILLECTOMY     TUBAL LIGATION      OB History   No obstetric history on file.      Home Medications    Prior to Admission medications   Medication Sig Start Date End Date Taking? Authorizing Provider  fluconazole  (DIFLUCAN ) 150 MG tablet Take 1 tablet (150 mg total) by mouth daily. Take 1 dose at the first sign of yeast infection, if still experiencing symptoms 72 hours later, you may take the second dose. 04/04/24  Yes Edna Gouty, PA-C  metroNIDAZOLE  (FLAGYL ) 500 MG tablet Take 1 tablet (500 mg  total) by mouth 2 (two) times daily. 04/04/24  Yes Edna Gouty, PA-C  fluticasone  (FLONASE ) 50 MCG/ACT nasal spray Place 2 sprays into both nostrils daily. 03/14/24   Austine Lefort, MD  levocetirizine (XYZAL  ALLERGY 24HR) 5 MG tablet Take 1 tablet (5 mg total) by mouth every evening. 03/14/24   Austine Lefort, MD  omeprazole  (PRILOSEC) 40 MG capsule Take 1 capsule (40 mg total) by mouth daily. 06/08/23   Austine Lefort, MD    Family History History reviewed. No pertinent family history.  Social History Social History   Tobacco Use   Smoking status: Former    Current packs/day: 0.50    Types: Cigarettes   Smokeless tobacco: Never  Vaping Use   Vaping status: Never Used  Substance Use Topics   Alcohol use: Yes    Comment: social   Drug use: Yes    Types: Marijuana     Allergies   Amoxicillin  and Latex   Review of Systems Review of Systems See HPI for relevant ROS.  Physical Exam Triage Vital Signs ED Triage Vitals [04/04/24 1833]  Encounter Vitals Group     BP 125/82     Systolic BP Percentile      Diastolic BP Percentile      Pulse Rate 85     Resp 16  Temp 97.8 F (36.6 C)     Temp Source Oral     SpO2 97 %     Weight      Height      Head Circumference      Peak Flow      Pain Score      Pain Loc      Pain Education      Exclude from Growth Chart    No data found.  Updated Vital Signs BP 125/82 (BP Location: Left Arm)   Pulse 85   Temp 97.8 F (36.6 C) (Oral)   Resp 16   LMP 03/16/2024   SpO2 97%   Visual Acuity Right Eye Distance:   Left Eye Distance:   Bilateral Distance:    Right Eye Near:   Left Eye Near:    Bilateral Near:     Physical Exam General: Alert and oriented, well-developed/well-nourished, calm, cooperative, no acute distress HEENT: Normocephalic atraumatic, moist mucous membranes, no scleral icterus, trachea midline Lungs: Speaking full sentences, non-labored respirations, no distress Heart: Regular rate and  rhythm Abdomen:  Soft, nondistended, nontender Musculoskeletal: Moves all extremities well GU: Declined Neurologic: Awake, A&O x4, gait normal Integumentary: Warm, dry, normal for ethnicity, intact, no rash Psychiatric: Appropriate mood & affect  UC Treatments / Results  Labs (all labs ordered are listed, but only abnormal results are displayed) Labs Reviewed  CERVICOVAGINAL ANCILLARY ONLY    EKG   Radiology No results found.  Procedures Procedures (including critical care time)  Medications Ordered in UC Medications - No data to display  Initial Impression / Assessment and Plan / UC Course  I have reviewed the triage vital signs and the nursing notes.  Pertinent labs & imaging results that were available during my care of the patient were reviewed by me and considered in my medical decision making (see chart for details).   Vaginal swab sent off, treating for BV, patient gets yeast infections with after any antibiotics so sent in Diflucan   Presents with vaginal itching and irritation.  Differential Diagnosis: BV, yeast infection, STI, contact dermatitis, UTI, trichomoniasis, including other diagnoses.  Rationale: Patient presents with vaginal itching and irritation after a recent sexual encounter.  She denies any rash, abdominal pain, known STI exposure, vaginal discharge, or other concerns.  She reports the symptoms feel similar to whenever she gets BV.  Patient performed a self vaginal swab and we have sent those off to the lab.  Empirically treated for BV as patient reports that this is most consistent with how she has felt in the past.  Prescribed metronidazole  for coverage of BV.  Additionally, prescribed fluconazole  as patient often times gets yeast infections on any antibiotics.  Recommended patient refrain from sexual activity until the results are back.  Discussed return precautions including abdominal pain, worsening of symptoms, rash, fever, or if she has any other  concerns.  Lastly, patient should follow-up with her primary care provider if symptoms are not improving.  Disposition: Stable to discharge home.  All questions answered to the best of this examiner's ability. Reviewed possible severe sequelae and other reasons to return to urgent care or ED for further evaluation and/or treatment. Advised to f/u PCP w/in 48 to 72 hours for further eval and/or reassessment. Patient voices understanding of the above and agrees to plan.  An appropriate evaluation has been performed, and in my medical judgment there is currently no evidence of an immediate life-threatening or surgical condition. Discharge is therefore indicated at  this time.  This document was created using the aid of voice recognition Scientist, clinical (histocompatibility and immunogenetics). Final Clinical Impressions(s) / UC Diagnoses   Final diagnoses:  Vaginal itching   Discharge Instructions      We have sent in an antibiotic to cover for possible BV.  Additionally, we have sent in a medication in case you develop a yeast infection on the antibiotic.  We should have the vaginal swab results in the next 3 to 5 days.  You will be contacted at that time if you test positive for anything.  We recommend following up with your primary care provider if symptoms or not improving.  Please return or go to the emergency department if you develop any abdominal pain, fever, rash, worsening of symptoms, or if you have any other concerns.  ED Prescriptions     Medication Sig Dispense Auth. Provider   metroNIDAZOLE  (FLAGYL ) 500 MG tablet Take 1 tablet (500 mg total) by mouth 2 (two) times daily. 14 tablet Edna Gouty, PA-C   fluconazole  (DIFLUCAN ) 150 MG tablet Take 1 tablet (150 mg total) by mouth daily. Take 1 dose at the first sign of yeast infection, if still experiencing symptoms 72 hours later, you may take the second dose. 2 tablet Edna Gouty, PA-C      PDMP not reviewed this encounter.   Edna Gouty,  PA-C 04/04/24 2013

## 2024-04-04 NOTE — ED Triage Notes (Addendum)
 Patient c/o vaginal itching and reports a history of BV. Patient denies abdominal pain , dysuria, or urinary frequency.  Patient added that she has a new sexual partner.

## 2024-04-04 NOTE — Discharge Instructions (Addendum)
 We have sent in an antibiotic to cover for possible BV.  Additionally, we have sent in a medication in case you develop a yeast infection on the antibiotic.  We should have the vaginal swab results in the next 3 to 5 days.  You will be contacted at that time if you test positive for anything.  We recommend following up with your primary care provider if symptoms or not improving.  Please return or go to the emergency department if you develop any abdominal pain, fever, rash, worsening of symptoms, or if you have any other concerns.

## 2024-04-05 ENCOUNTER — Telehealth (HOSPITAL_COMMUNITY): Payer: Self-pay

## 2024-04-05 LAB — CERVICOVAGINAL ANCILLARY ONLY
Bacterial Vaginitis (gardnerella): POSITIVE — AB
Candida Glabrata: NEGATIVE
Candida Vaginitis: NEGATIVE
Chlamydia: NEGATIVE
Comment: NEGATIVE
Comment: NEGATIVE
Comment: NEGATIVE
Comment: NEGATIVE
Comment: NEGATIVE
Comment: NORMAL
Neisseria Gonorrhea: NEGATIVE
Trichomonas: NEGATIVE

## 2024-04-05 NOTE — Telephone Encounter (Signed)
 Created in error

## 2024-04-08 DIAGNOSIS — Z419 Encounter for procedure for purposes other than remedying health state, unspecified: Secondary | ICD-10-CM | POA: Diagnosis not present

## 2024-05-09 DIAGNOSIS — Z419 Encounter for procedure for purposes other than remedying health state, unspecified: Secondary | ICD-10-CM | POA: Diagnosis not present

## 2024-06-08 DIAGNOSIS — Z419 Encounter for procedure for purposes other than remedying health state, unspecified: Secondary | ICD-10-CM | POA: Diagnosis not present

## 2024-06-11 DIAGNOSIS — Z130 Encounter for screening for diseases of the blood and blood-forming organs and certain disorders involving the immune mechanism: Secondary | ICD-10-CM | POA: Diagnosis not present

## 2024-06-11 DIAGNOSIS — Z01419 Encounter for gynecological examination (general) (routine) without abnormal findings: Secondary | ICD-10-CM | POA: Diagnosis not present

## 2024-06-11 DIAGNOSIS — Z124 Encounter for screening for malignant neoplasm of cervix: Secondary | ICD-10-CM | POA: Diagnosis not present

## 2024-06-11 DIAGNOSIS — Z1389 Encounter for screening for other disorder: Secondary | ICD-10-CM | POA: Diagnosis not present

## 2024-06-11 DIAGNOSIS — Z1151 Encounter for screening for human papillomavirus (HPV): Secondary | ICD-10-CM | POA: Diagnosis not present

## 2024-06-11 DIAGNOSIS — R87810 Cervical high risk human papillomavirus (HPV) DNA test positive: Secondary | ICD-10-CM | POA: Diagnosis not present

## 2024-06-11 DIAGNOSIS — Z113 Encounter for screening for infections with a predominantly sexual mode of transmission: Secondary | ICD-10-CM | POA: Diagnosis not present

## 2024-06-20 DIAGNOSIS — R5382 Chronic fatigue, unspecified: Secondary | ICD-10-CM | POA: Diagnosis not present

## 2024-06-20 DIAGNOSIS — Z13228 Encounter for screening for other metabolic disorders: Secondary | ICD-10-CM | POA: Diagnosis not present

## 2024-06-20 DIAGNOSIS — Z13220 Encounter for screening for lipoid disorders: Secondary | ICD-10-CM | POA: Diagnosis not present

## 2024-06-20 DIAGNOSIS — Z130 Encounter for screening for diseases of the blood and blood-forming organs and certain disorders involving the immune mechanism: Secondary | ICD-10-CM | POA: Diagnosis not present

## 2024-06-20 DIAGNOSIS — Z131 Encounter for screening for diabetes mellitus: Secondary | ICD-10-CM | POA: Diagnosis not present

## 2024-07-09 DIAGNOSIS — Z419 Encounter for procedure for purposes other than remedying health state, unspecified: Secondary | ICD-10-CM | POA: Diagnosis not present

## 2024-07-10 DIAGNOSIS — R87612 Low grade squamous intraepithelial lesion on cytologic smear of cervix (LGSIL): Secondary | ICD-10-CM | POA: Diagnosis not present

## 2024-07-30 ENCOUNTER — Telehealth: Payer: Self-pay

## 2024-07-30 NOTE — Telephone Encounter (Signed)
 Copied from CRM [id number]. Topic: General - Call Back - No Documentation >> Jul 30, 2024  3:00 PM [name] wrote: Reason for CRM: patient is calling saying that she needs paperwork for her to get a new toilet in her apartment said that a fax will be coming through any questions please call [phone]

## 2024-08-09 DIAGNOSIS — Z419 Encounter for procedure for purposes other than remedying health state, unspecified: Secondary | ICD-10-CM | POA: Diagnosis not present

## 2024-08-12 ENCOUNTER — Other Ambulatory Visit: Payer: Self-pay | Admitting: Family Medicine

## 2024-08-12 DIAGNOSIS — Z1231 Encounter for screening mammogram for malignant neoplasm of breast: Secondary | ICD-10-CM

## 2024-08-13 ENCOUNTER — Ambulatory Visit
Admission: RE | Admit: 2024-08-13 | Discharge: 2024-08-13 | Disposition: A | Discharge status: Home / Self Care | Source: Ambulatory Visit | Attending: Family Medicine | Admitting: Family Medicine

## 2024-08-13 DIAGNOSIS — Z1231 Encounter for screening mammogram for malignant neoplasm of breast: Secondary | ICD-10-CM

## 2024-09-08 DIAGNOSIS — Z419 Encounter for procedure for purposes other than remedying health state, unspecified: Secondary | ICD-10-CM | POA: Diagnosis not present

## 2024-10-03 ENCOUNTER — Ambulatory Visit: Payer: Medicaid Other | Admitting: Family Medicine

## 2024-10-03 ENCOUNTER — Encounter: Payer: Self-pay | Admitting: Family Medicine

## 2024-10-03 VITALS — BP 122/82 | HR 85 | Temp 97.9°F | Ht 68.0 in | Wt 320.0 lb

## 2024-10-03 DIAGNOSIS — Z1211 Encounter for screening for malignant neoplasm of colon: Secondary | ICD-10-CM

## 2024-10-03 DIAGNOSIS — Z0000 Encounter for general adult medical examination without abnormal findings: Secondary | ICD-10-CM

## 2024-10-03 DIAGNOSIS — Z23 Encounter for immunization: Secondary | ICD-10-CM | POA: Diagnosis not present

## 2024-10-03 NOTE — Progress Notes (Signed)
 Subjective:    Patient ID: [name], female    DOB: [birth year], 43 y.o.   MRN: [id number]  Patient is here today for complete physical exam.  Patient's last Pap smear was in 2023.  This was normal.  However the patient saw her gynecologist this year and had another Pap smear.  According to her, the Pap smear was abnormal.  She ultimately went and had colposcopy which showed mild dysplasia per her report.  They recommended repeating a Pap smear next year.  She would like to get the HPV vaccine.  Otherwise she is doing well with no concerns.  Her mammogram is up-to-date.  She had this in September.  She is due for colon cancer screening. Immunization History  Administered Date(s) Administered   Tdap 07/18/2016   Patient appears to be due for a flu shot.  Past Medical History:  Diagnosis Date   Anxiety    Asthma    Depression    Ectopic pregnancy    Irritable bowel syndrome (IBS) .   PTSD (post-traumatic stress disorder)    Past Surgical History:  Procedure Laterality Date   TONSILLECTOMY     TUBAL LIGATION     Current Outpatient Medications on File Prior to Visit  Medication Sig Dispense Refill   fluconazole  (DIFLUCAN ) 150 MG tablet Take 1 tablet (150 mg total) by mouth daily. Take 1 dose at the first sign of yeast infection, if still experiencing symptoms 72 hours later, you may take the second dose. 2 tablet 0   fluticasone  (FLONASE ) 50 MCG/ACT nasal spray Place 2 sprays into both nostrils daily. 16 g 6   levocetirizine (XYZAL  ALLERGY 24HR) 5 MG tablet Take 1 tablet (5 mg total) by mouth every evening. 30 tablet 11   metroNIDAZOLE  (FLAGYL ) 500 MG tablet Take 1 tablet (500 mg total) by mouth 2 (two) times daily. 14 tablet 0   omeprazole  (PRILOSEC) 40 MG capsule Take 1 capsule (40 mg total) by mouth daily. 90 capsule 1   No current facility-administered medications on file prior to visit.   Allergies  Allergen Reactions   Amoxicillin  Swelling   Latex Rash    Reaction to  gloves   Social History   Socioeconomic History   Marital status: Single    Spouse name: Not on file   Number of children: Not on file   Years of education: Not on file   Highest education level: 12th grade  Occupational History   Not on file  Tobacco Use   Smoking status: Former    Current packs/day: 0.50    Types: Cigarettes   Smokeless tobacco: Never  Vaping Use   Vaping status: Never Used  Substance and Sexual Activity   Alcohol use: Yes    Comment: social   Drug use: Yes    Types: Marijuana   Sexual activity: Yes    Birth control/protection: None  Other Topics Concern   Not on file  Social History Narrative   Not on file   Social Drivers of Health   Financial Resource Strain: Patient Declined (03/14/2024)   Overall Financial Resource Strain (CARDIA)    Difficulty of Paying Living Expenses: Patient declined  Food Insecurity: Patient Declined (03/14/2024)   Hunger Vital Sign    Worried About Running Out of Food in the Last Year: Patient declined    Ran Out of Food in the Last Year: Patient declined  Transportation Needs: No Transportation Needs (03/14/2024)   PRAPARE - Transportation    Lack  of Transportation (Medical): No    Lack of Transportation (Non-Medical): No  Physical Activity: Unknown (03/14/2024)   Exercise Vital Sign    Days of Exercise per Week: Patient declined    Minutes of Exercise per Session: Not on file  Stress: Patient Declined (03/14/2024)   [employer] of Occupational Health - Occupational Stress Questionnaire    Feeling of Stress : Patient declined  Social Connections: Socially Isolated (03/14/2024)   Social Connection and Isolation Panel    Frequency of Communication with Friends and Family: More than three times a week    Frequency of Social Gatherings with Friends and Family: Once a week    Attends Religious Services: Never    Database Administrator or Organizations: No    Attends Engineer, Structural: Not on file     Marital Status: Separated  Intimate Partner Violence: Not on file   Family History  Problem Relation Age of Onset   Breast cancer Neg Hx       Review of Systems  All other systems reviewed and are negative.      Objective:   Physical Exam Vitals reviewed.  Constitutional:      General: She is not in acute distress.    Appearance: She is obese. She is not ill-appearing, toxic-appearing or diaphoretic.  HENT:     Head: Normocephalic and atraumatic.     Right Ear: Tympanic membrane, ear canal and external ear normal. There is no impacted cerumen.     Left Ear: Tympanic membrane, ear canal and external ear normal. There is no impacted cerumen.     Nose: Nose normal. No congestion or rhinorrhea.     Mouth/Throat:     Mouth: Mucous membranes are moist.     Pharynx: Oropharynx is clear. No oropharyngeal exudate or posterior oropharyngeal erythema.  Eyes:     General: No scleral icterus.       Right eye: No discharge.        Left eye: No discharge.     Extraocular Movements: Extraocular movements intact.     Conjunctiva/sclera: Conjunctivae normal.     Pupils: Pupils are equal, round, and reactive to light.  Neck:     Vascular: No carotid bruit.  Cardiovascular:     Rate and Rhythm: Normal rate and regular rhythm.     Pulses: Normal pulses.     Heart sounds: Normal heart sounds. No murmur heard.    No friction rub. No gallop.  Pulmonary:     Effort: Pulmonary effort is normal. No respiratory distress.     Breath sounds: Normal breath sounds. No stridor. No wheezing, rhonchi or rales.  Chest:     Chest wall: No tenderness.  Abdominal:     General: Abdomen is flat. Bowel sounds are normal. There is no distension.     Palpations: Abdomen is soft. There is no mass.     Tenderness: There is no abdominal tenderness. There is no guarding or rebound.     Hernia: No hernia is present.  Musculoskeletal:        General: Normal range of motion.     Cervical back: Normal range of  motion and neck supple. No muscular tenderness.     Right lower leg: No edema.     Left lower leg: No edema.  Lymphadenopathy:     Cervical: No cervical adenopathy.  Skin:    General: Skin is warm.     Capillary Refill: Capillary refill takes less than 2  seconds.     Coloration: Skin is not jaundiced or pale.     Findings: No bruising, erythema, lesion or rash.  Neurological:     General: No focal deficit present.     Mental Status: She is alert and oriented to person, place, and time. Mental status is at baseline.     Cranial Nerves: No cranial nerve deficit.     Sensory: No sensory deficit.     Motor: No weakness.     Coordination: Coordination normal.     Gait: Gait normal.     Deep Tendon Reflexes: Reflexes normal.  Psychiatric:        Mood and Affect: Mood normal.        Behavior: Behavior normal.        Thought Content: Thought content normal.        Judgment: Judgment normal.           Assessment & Plan:  General medical exam - Plan: CBC with Differential/Platelet, Comprehensive metabolic panel with GFR, Lipid panel Mammogram is up-to-date.  Recommended a colonoscopy.  Patient elects to receive Cologuard.  Pap smear is up-to-date.  I agree with the recommendation she was given by her gynecologist.  She needs to have a Pap smear again next year to monitor to ensure resolution.  We will give the patient the HPV vaccine today.  Recommended a flu shot but she politely declined.  I will check a CBC and CMP and lipid panel

## 2024-10-03 NOTE — Addendum Note (Signed)
 Addended by: ANGELENA RONAL BRADLEY K on: 10/03/2024 09:20 AM   Modules accepted: Orders

## 2024-10-04 ENCOUNTER — Ambulatory Visit: Payer: Self-pay | Admitting: Family Medicine

## 2024-10-04 LAB — CBC WITH DIFFERENTIAL/PLATELET
Absolute Lymphocytes: 2115 {cells}/uL (ref 850–3900)
Absolute Monocytes: 504 {cells}/uL (ref 200–950)
Basophils Absolute: 19 {cells}/uL (ref 0–200)
Basophils Relative: 0.2 %
Eosinophils Absolute: 281 {cells}/uL (ref 15–500)
Eosinophils Relative: 2.9 %
HCT: 42.3 % (ref 35.0–45.0)
Hemoglobin: 13.9 g/dL (ref 11.7–15.5)
MCH: 28 pg (ref 27.0–33.0)
MCHC: 32.9 g/dL (ref 32.0–36.0)
MCV: 85.1 fL (ref 80.0–100.0)
MPV: 10.8 fL (ref 7.5–12.5)
Monocytes Relative: 5.2 %
Neutro Abs: 6780 {cells}/uL (ref 1500–7800)
Neutrophils Relative %: 69.9 %
Platelets: 215 Thousand/uL (ref 140–400)
RBC: 4.97 Million/uL (ref 3.80–5.10)
RDW: 12 % (ref 11.0–15.0)
Total Lymphocyte: 21.8 %
WBC: 9.7 Thousand/uL (ref 3.8–10.8)

## 2024-10-04 LAB — COMPREHENSIVE METABOLIC PANEL WITH GFR
AG Ratio: 1.5 (calc) (ref 1.0–2.5)
ALT: 39 U/L — ABNORMAL HIGH (ref 6–29)
AST: 44 U/L — ABNORMAL HIGH (ref 10–35)
Albumin: 3.9 g/dL (ref 3.6–5.1)
Alkaline phosphatase (APISO): 78 U/L (ref 31–125)
BUN: 7 mg/dL (ref 7–25)
CO2: 28 mmol/L (ref 20–32)
Calcium: 8.7 mg/dL (ref 8.6–10.2)
Chloride: 103 mmol/L (ref 98–110)
Creat: 0.66 mg/dL (ref 0.50–0.99)
Globulin: 2.6 g/dL (ref 1.9–3.7)
Glucose, Bld: 111 mg/dL — ABNORMAL HIGH (ref 65–99)
Potassium: 3.8 mmol/L (ref 3.5–5.3)
Sodium: 140 mmol/L (ref 135–146)
Total Bilirubin: 0.6 mg/dL (ref 0.2–1.2)
Total Protein: 6.5 g/dL (ref 6.1–8.1)
eGFR: 110 mL/min/1.73m2 (ref >=60)

## 2024-10-04 LAB — LIPID PANEL
Cholesterol: 186 mg/dL (ref <200)
HDL: 48 mg/dL — ABNORMAL LOW (ref >=50)
LDL Cholesterol (Calc): 117 mg/dL — ABNORMAL HIGH
Non-HDL Cholesterol (Calc): 138 mg/dL — ABNORMAL HIGH (ref <130)
Total CHOL/HDL Ratio: 3.9 (calc) (ref <5.0)
Triglycerides: 107 mg/dL (ref <150)

## 2024-11-08 DIAGNOSIS — Z419 Encounter for procedure for purposes other than remedying health state, unspecified: Secondary | ICD-10-CM | POA: Diagnosis not present

## 2024-12-09 ENCOUNTER — Telehealth: Payer: Self-pay

## 2024-12-09 ENCOUNTER — Ambulatory Visit

## 2024-12-09 DIAGNOSIS — Z23 Encounter for immunization: Secondary | ICD-10-CM | POA: Diagnosis not present

## 2024-12-09 NOTE — Telephone Encounter (Signed)
 Pt came in office today for her 2nd HPV vaccine, and  wanted to know if she could get the 3rd HPV vaccine even though she will be 44 by the time the next one is due. Pt will be due for the 3rd vaccine in May. Please advise.

## 2024-12-09 NOTE — Progress Notes (Signed)
 Patient is in office today for a nurse visit for Immunization. Patient Injection was given in the  Left deltoid. Patient tolerated injection well.

## 2025-10-06 ENCOUNTER — Encounter: Admitting: Family Medicine
